# Patient Record
Sex: Female | Born: 1935 | Race: Black or African American | Hispanic: No | Smoking: Never smoker
Health system: Southern US, Community
[De-identification: ages and names within clinical notes are randomized; demographics above are authoritative.]

## PROBLEM LIST (undated history)

## (undated) DIAGNOSIS — I499 Cardiac arrhythmia, unspecified: Secondary | ICD-10-CM

## (undated) DIAGNOSIS — M069 Rheumatoid arthritis, unspecified: Secondary | ICD-10-CM

## (undated) DIAGNOSIS — F03918 Unspecified dementia, unspecified severity, with other behavioral disturbance: Secondary | ICD-10-CM

## (undated) DIAGNOSIS — R569 Unspecified convulsions: Secondary | ICD-10-CM

## (undated) DIAGNOSIS — R609 Edema, unspecified: Secondary | ICD-10-CM

## (undated) DIAGNOSIS — I639 Cerebral infarction, unspecified: Secondary | ICD-10-CM

## (undated) DIAGNOSIS — E559 Vitamin D deficiency, unspecified: Secondary | ICD-10-CM

## (undated) DIAGNOSIS — F411 Generalized anxiety disorder: Secondary | ICD-10-CM

## (undated) DIAGNOSIS — I10 Essential (primary) hypertension: Secondary | ICD-10-CM

## (undated) DIAGNOSIS — I2510 Atherosclerotic heart disease of native coronary artery without angina pectoris: Secondary | ICD-10-CM

## (undated) DIAGNOSIS — W19XXXA Unspecified fall, initial encounter: Secondary | ICD-10-CM

## (undated) DIAGNOSIS — E039 Hypothyroidism, unspecified: Secondary | ICD-10-CM

## (undated) DIAGNOSIS — I6990 Unspecified sequelae of unspecified cerebrovascular disease: Secondary | ICD-10-CM

## (undated) DIAGNOSIS — F29 Unspecified psychosis not due to a substance or known physiological condition: Secondary | ICD-10-CM

## (undated) DIAGNOSIS — G4700 Insomnia, unspecified: Secondary | ICD-10-CM

## (undated) DIAGNOSIS — F0391 Unspecified dementia with behavioral disturbance: Secondary | ICD-10-CM

## (undated) DIAGNOSIS — G9340 Encephalopathy, unspecified: Secondary | ICD-10-CM

## (undated) DIAGNOSIS — D649 Anemia, unspecified: Secondary | ICD-10-CM

## (undated) DIAGNOSIS — M810 Age-related osteoporosis without current pathological fracture: Secondary | ICD-10-CM

## (undated) DIAGNOSIS — K5900 Constipation, unspecified: Secondary | ICD-10-CM

## (undated) DIAGNOSIS — R32 Unspecified urinary incontinence: Secondary | ICD-10-CM

## (undated) DIAGNOSIS — E871 Hypo-osmolality and hyponatremia: Secondary | ICD-10-CM

## (undated) HISTORY — DX: Atherosclerotic heart disease of native coronary artery without angina pectoris: I25.10

## (undated) HISTORY — DX: Age-related osteoporosis without current pathological fracture: M81.0

## (undated) HISTORY — DX: Vitamin D deficiency, unspecified: E55.9

## (undated) HISTORY — DX: Unspecified sequelae of unspecified cerebrovascular disease: I69.90

## (undated) HISTORY — DX: Insomnia, unspecified: G47.00

## (undated) HISTORY — DX: Constipation, unspecified: K59.00

## (undated) HISTORY — DX: Hypothyroidism, unspecified: E03.9

## (undated) HISTORY — DX: Edema, unspecified: R60.9

## (undated) HISTORY — DX: Generalized anxiety disorder: F41.1

## (undated) HISTORY — DX: Unspecified dementia, unspecified severity, with other behavioral disturbance: F03.918

## (undated) HISTORY — DX: Unspecified dementia with behavioral disturbance: F03.91

## (undated) HISTORY — PX: TOE SURGERY: SHX1073

---

## 2000-10-27 ENCOUNTER — Emergency Department (HOSPITAL_COMMUNITY): Admission: EM | Admit: 2000-10-27 | Discharge: 2000-10-27 | Payer: Self-pay | Admitting: Emergency Medicine

## 2000-11-09 ENCOUNTER — Other Ambulatory Visit: Admission: RE | Admit: 2000-11-09 | Discharge: 2000-11-09 | Payer: Self-pay | Admitting: Internal Medicine

## 2000-11-15 ENCOUNTER — Encounter: Payer: Self-pay | Admitting: Internal Medicine

## 2000-11-15 ENCOUNTER — Ambulatory Visit (HOSPITAL_COMMUNITY): Admission: RE | Admit: 2000-11-15 | Discharge: 2000-11-15 | Payer: Self-pay | Admitting: Internal Medicine

## 2001-09-05 ENCOUNTER — Encounter: Payer: Self-pay | Admitting: Family Medicine

## 2001-09-05 ENCOUNTER — Ambulatory Visit (HOSPITAL_COMMUNITY): Admission: RE | Admit: 2001-09-05 | Discharge: 2001-09-05 | Payer: Self-pay | Admitting: Family Medicine

## 2001-10-11 ENCOUNTER — Encounter (HOSPITAL_COMMUNITY): Admission: RE | Admit: 2001-10-11 | Discharge: 2001-11-10 | Payer: Self-pay | Admitting: Rheumatology

## 2001-10-11 ENCOUNTER — Encounter: Payer: Self-pay | Admitting: Rheumatology

## 2001-10-17 ENCOUNTER — Encounter: Admission: RE | Admit: 2001-10-17 | Discharge: 2001-10-17 | Payer: Self-pay | Admitting: Rheumatology

## 2001-10-17 ENCOUNTER — Encounter: Payer: Self-pay | Admitting: Rheumatology

## 2001-11-15 ENCOUNTER — Encounter (HOSPITAL_COMMUNITY): Admission: RE | Admit: 2001-11-15 | Discharge: 2001-12-15 | Payer: Self-pay | Admitting: Rheumatology

## 2001-11-19 ENCOUNTER — Ambulatory Visit (HOSPITAL_COMMUNITY): Admission: RE | Admit: 2001-11-19 | Discharge: 2001-11-19 | Payer: Self-pay | Admitting: Family Medicine

## 2001-11-19 ENCOUNTER — Encounter: Payer: Self-pay | Admitting: Family Medicine

## 2002-02-10 ENCOUNTER — Emergency Department (HOSPITAL_COMMUNITY): Admission: EM | Admit: 2002-02-10 | Discharge: 2002-02-10 | Payer: Self-pay | Admitting: Emergency Medicine

## 2002-02-10 ENCOUNTER — Encounter: Payer: Self-pay | Admitting: Emergency Medicine

## 2002-02-12 ENCOUNTER — Ambulatory Visit (HOSPITAL_COMMUNITY): Admission: RE | Admit: 2002-02-12 | Discharge: 2002-02-12 | Payer: Self-pay | Admitting: Family Medicine

## 2002-02-28 ENCOUNTER — Encounter (HOSPITAL_COMMUNITY): Admission: RE | Admit: 2002-02-28 | Discharge: 2002-03-30 | Payer: Self-pay | Admitting: Rheumatology

## 2002-04-25 ENCOUNTER — Encounter (HOSPITAL_COMMUNITY): Admission: RE | Admit: 2002-04-25 | Discharge: 2002-05-25 | Payer: Self-pay | Admitting: Rheumatology

## 2002-06-06 ENCOUNTER — Encounter (HOSPITAL_COMMUNITY): Admission: RE | Admit: 2002-06-06 | Discharge: 2002-07-06 | Payer: Self-pay | Admitting: Rheumatology

## 2002-08-01 ENCOUNTER — Encounter (HOSPITAL_COMMUNITY): Admission: RE | Admit: 2002-08-01 | Discharge: 2002-08-31 | Payer: Self-pay | Admitting: Rheumatology

## 2002-08-01 ENCOUNTER — Encounter: Admission: RE | Admit: 2002-08-01 | Discharge: 2002-08-01 | Payer: Self-pay | Admitting: Rheumatology

## 2002-11-20 ENCOUNTER — Ambulatory Visit (HOSPITAL_COMMUNITY): Admission: RE | Admit: 2002-11-20 | Discharge: 2002-11-20 | Payer: Self-pay | Admitting: Family Medicine

## 2002-11-20 ENCOUNTER — Encounter: Payer: Self-pay | Admitting: Family Medicine

## 2002-11-21 ENCOUNTER — Encounter (HOSPITAL_COMMUNITY): Admission: RE | Admit: 2002-11-21 | Discharge: 2002-12-21 | Payer: Self-pay | Admitting: Rheumatology

## 2003-02-13 ENCOUNTER — Encounter (HOSPITAL_COMMUNITY): Admission: RE | Admit: 2003-02-13 | Discharge: 2003-03-15 | Payer: Self-pay | Admitting: Rheumatology

## 2003-04-23 ENCOUNTER — Encounter (HOSPITAL_COMMUNITY): Admission: RE | Admit: 2003-04-23 | Discharge: 2003-05-23 | Payer: Self-pay | Admitting: Rheumatology

## 2003-06-11 ENCOUNTER — Inpatient Hospital Stay (HOSPITAL_COMMUNITY): Admission: EM | Admit: 2003-06-11 | Discharge: 2003-06-12 | Payer: Self-pay | Admitting: Emergency Medicine

## 2003-06-16 ENCOUNTER — Ambulatory Visit (HOSPITAL_COMMUNITY): Admission: RE | Admit: 2003-06-16 | Discharge: 2003-06-16 | Payer: Self-pay | Admitting: Family Medicine

## 2003-07-27 ENCOUNTER — Emergency Department (HOSPITAL_COMMUNITY): Admission: EM | Admit: 2003-07-27 | Discharge: 2003-07-27 | Payer: Self-pay | Admitting: Emergency Medicine

## 2003-08-05 ENCOUNTER — Inpatient Hospital Stay (HOSPITAL_COMMUNITY): Admission: AD | Admit: 2003-08-05 | Discharge: 2003-08-06 | Payer: Self-pay | Admitting: *Deleted

## 2003-11-24 ENCOUNTER — Ambulatory Visit (HOSPITAL_COMMUNITY): Admission: RE | Admit: 2003-11-24 | Discharge: 2003-11-24 | Payer: Self-pay | Admitting: Family Medicine

## 2003-12-18 ENCOUNTER — Ambulatory Visit (HOSPITAL_COMMUNITY): Admission: RE | Admit: 2003-12-18 | Discharge: 2003-12-18 | Payer: Self-pay | Admitting: Internal Medicine

## 2004-06-15 ENCOUNTER — Ambulatory Visit: Payer: Self-pay | Admitting: Family Medicine

## 2004-09-16 ENCOUNTER — Ambulatory Visit: Payer: Self-pay | Admitting: Family Medicine

## 2004-11-24 ENCOUNTER — Ambulatory Visit: Payer: Self-pay | Admitting: Family Medicine

## 2004-11-26 ENCOUNTER — Ambulatory Visit (HOSPITAL_COMMUNITY): Admission: RE | Admit: 2004-11-26 | Discharge: 2004-11-26 | Payer: Self-pay | Admitting: Family Medicine

## 2005-03-11 ENCOUNTER — Ambulatory Visit: Payer: Self-pay | Admitting: Family Medicine

## 2005-04-05 ENCOUNTER — Ambulatory Visit: Payer: Self-pay | Admitting: Family Medicine

## 2005-04-11 ENCOUNTER — Ambulatory Visit (HOSPITAL_COMMUNITY): Admission: RE | Admit: 2005-04-11 | Discharge: 2005-04-11 | Payer: Self-pay | Admitting: Pediatrics

## 2005-04-18 ENCOUNTER — Ambulatory Visit (HOSPITAL_COMMUNITY): Admission: RE | Admit: 2005-04-18 | Discharge: 2005-04-18 | Payer: Self-pay | Admitting: *Deleted

## 2005-08-05 ENCOUNTER — Ambulatory Visit: Payer: Self-pay | Admitting: Family Medicine

## 2005-08-05 ENCOUNTER — Ambulatory Visit (HOSPITAL_COMMUNITY): Admission: RE | Admit: 2005-08-05 | Discharge: 2005-08-05 | Payer: Self-pay | Admitting: Family Medicine

## 2005-08-26 ENCOUNTER — Ambulatory Visit: Payer: Self-pay | Admitting: Family Medicine

## 2005-11-25 ENCOUNTER — Encounter (INDEPENDENT_AMBULATORY_CARE_PROVIDER_SITE_OTHER): Payer: Self-pay | Admitting: *Deleted

## 2005-11-25 LAB — CONVERTED CEMR LAB: Pap Smear: NORMAL

## 2005-11-28 ENCOUNTER — Ambulatory Visit: Payer: Self-pay | Admitting: Family Medicine

## 2005-11-28 ENCOUNTER — Ambulatory Visit (HOSPITAL_COMMUNITY): Admission: RE | Admit: 2005-11-28 | Discharge: 2005-11-28 | Payer: Self-pay | Admitting: Family Medicine

## 2005-11-28 ENCOUNTER — Other Ambulatory Visit: Admission: RE | Admit: 2005-11-28 | Discharge: 2005-11-28 | Payer: Self-pay | Admitting: Family Medicine

## 2006-02-24 ENCOUNTER — Emergency Department (HOSPITAL_COMMUNITY): Admission: EM | Admit: 2006-02-24 | Discharge: 2006-02-24 | Payer: Self-pay | Admitting: Emergency Medicine

## 2006-03-20 ENCOUNTER — Ambulatory Visit: Payer: Self-pay | Admitting: Family Medicine

## 2006-03-21 ENCOUNTER — Ambulatory Visit: Payer: Self-pay | Admitting: Family Medicine

## 2006-03-22 ENCOUNTER — Ambulatory Visit (HOSPITAL_COMMUNITY): Admission: RE | Admit: 2006-03-22 | Discharge: 2006-03-22 | Payer: Self-pay | Admitting: Family Medicine

## 2006-06-05 ENCOUNTER — Observation Stay (HOSPITAL_COMMUNITY): Admission: EM | Admit: 2006-06-05 | Discharge: 2006-06-06 | Payer: Self-pay | Admitting: Emergency Medicine

## 2006-06-27 HISTORY — PX: EYE SURGERY: SHX253

## 2006-06-30 ENCOUNTER — Ambulatory Visit: Payer: Self-pay | Admitting: Family Medicine

## 2006-07-28 ENCOUNTER — Ambulatory Visit: Payer: Self-pay | Admitting: Family Medicine

## 2006-08-25 ENCOUNTER — Ambulatory Visit: Payer: Self-pay | Admitting: Family Medicine

## 2006-09-08 ENCOUNTER — Encounter: Payer: Self-pay | Admitting: Family Medicine

## 2006-09-08 LAB — CONVERTED CEMR LAB
ALT: <8 units/L (ref 0–35)
AST: 13 units/L (ref 0–37)
Albumin: 3.9 g/dL (ref 3.5–5.2)
Alkaline Phosphatase: 54 units/L (ref 39–117)
BUN: 11 mg/dL (ref 6–23)
Bilirubin, Direct: 0.1 mg/dL (ref 0.0–0.3)
CO2: 27 meq/L (ref 19–32)
Calcium: 9.1 mg/dL (ref 8.4–10.5)
Chloride: 103 meq/L (ref 96–112)
Cholesterol: 129 mg/dL (ref 0–200)
Creatinine, Ser: 0.62 mg/dL (ref 0.40–1.20)
Glucose, Bld: 79 mg/dL (ref 70–99)
HCT: 34.2 % — ABNORMAL LOW (ref 36.0–46.0)
HDL: 58 mg/dL (ref >39)
Hemoglobin: 10.8 g/dL — ABNORMAL LOW (ref 12.0–15.0)
Indirect Bilirubin: 0.3 mg/dL (ref 0.0–0.9)
LDL Cholesterol: 60 mg/dL (ref 0–99)
MCHC: 31.6 g/dL (ref 30.0–36.0)
MCV: 92.2 fL (ref 78.0–100.0)
Platelets: 312 10*3/uL (ref 150–400)
Potassium: 5 meq/L (ref 3.5–5.3)
RBC: 3.71 M/uL — ABNORMAL LOW (ref 3.87–5.11)
RDW: 15.7 % — ABNORMAL HIGH (ref 11.5–14.0)
Sodium: 139 meq/L (ref 135–145)
Total Bilirubin: 0.4 mg/dL (ref 0.3–1.2)
Total CHOL/HDL Ratio: 2.2
Total Protein: 7 g/dL (ref 6.0–8.3)
Triglycerides: 57 mg/dL (ref <150)
VLDL: 11 mg/dL (ref 0–40)
WBC: 2.9 10*3/uL — ABNORMAL LOW (ref 4.0–10.5)

## 2006-11-24 ENCOUNTER — Ambulatory Visit: Payer: Self-pay | Admitting: Family Medicine

## 2006-12-01 ENCOUNTER — Ambulatory Visit (HOSPITAL_COMMUNITY): Admission: RE | Admit: 2006-12-01 | Discharge: 2006-12-01 | Payer: Self-pay | Admitting: Family Medicine

## 2006-12-25 ENCOUNTER — Ambulatory Visit: Payer: Self-pay | Admitting: Family Medicine

## 2006-12-25 ENCOUNTER — Other Ambulatory Visit: Admission: RE | Admit: 2006-12-25 | Discharge: 2006-12-25 | Payer: Self-pay | Admitting: Family Medicine

## 2006-12-25 ENCOUNTER — Encounter: Payer: Self-pay | Admitting: Family Medicine

## 2006-12-25 ENCOUNTER — Encounter (INDEPENDENT_AMBULATORY_CARE_PROVIDER_SITE_OTHER): Payer: Self-pay | Admitting: *Deleted

## 2006-12-25 LAB — CONVERTED CEMR LAB: Pap Smear: NORMAL

## 2007-02-06 ENCOUNTER — Ambulatory Visit (HOSPITAL_COMMUNITY): Admission: RE | Admit: 2007-02-06 | Discharge: 2007-02-06 | Payer: Self-pay | Admitting: Ophthalmology

## 2007-03-06 ENCOUNTER — Ambulatory Visit (HOSPITAL_COMMUNITY): Admission: RE | Admit: 2007-03-06 | Discharge: 2007-03-06 | Payer: Self-pay | Admitting: Ophthalmology

## 2007-04-06 ENCOUNTER — Ambulatory Visit: Payer: Self-pay | Admitting: Family Medicine

## 2007-04-26 ENCOUNTER — Ambulatory Visit: Payer: Self-pay | Admitting: Family Medicine

## 2007-04-30 ENCOUNTER — Encounter: Payer: Self-pay | Admitting: Family Medicine

## 2007-04-30 LAB — CONVERTED CEMR LAB
Cholesterol: 141 mg/dL (ref 0–200)
HDL: 59 mg/dL (ref >39)
LDL Cholesterol: 70 mg/dL (ref 0–99)
Total CHOL/HDL Ratio: 2.4
Triglycerides: 62 mg/dL (ref <150)
VLDL: 12 mg/dL (ref 0–40)

## 2007-05-25 ENCOUNTER — Emergency Department (HOSPITAL_COMMUNITY): Admission: EM | Admit: 2007-05-25 | Discharge: 2007-05-25 | Payer: Self-pay | Admitting: Emergency Medicine

## 2007-06-26 ENCOUNTER — Encounter: Payer: Self-pay | Admitting: *Deleted

## 2007-06-26 DIAGNOSIS — I10 Essential (primary) hypertension: Secondary | ICD-10-CM | POA: Insufficient documentation

## 2007-06-26 DIAGNOSIS — F411 Generalized anxiety disorder: Secondary | ICD-10-CM | POA: Insufficient documentation

## 2007-06-26 DIAGNOSIS — M1990 Unspecified osteoarthritis, unspecified site: Secondary | ICD-10-CM | POA: Insufficient documentation

## 2007-06-26 DIAGNOSIS — I2510 Atherosclerotic heart disease of native coronary artery without angina pectoris: Secondary | ICD-10-CM | POA: Insufficient documentation

## 2007-06-26 DIAGNOSIS — M069 Rheumatoid arthritis, unspecified: Secondary | ICD-10-CM | POA: Insufficient documentation

## 2007-06-26 DIAGNOSIS — M949 Disorder of cartilage, unspecified: Secondary | ICD-10-CM

## 2007-06-26 DIAGNOSIS — E785 Hyperlipidemia, unspecified: Secondary | ICD-10-CM

## 2007-06-26 DIAGNOSIS — M899 Disorder of bone, unspecified: Secondary | ICD-10-CM | POA: Insufficient documentation

## 2007-06-26 DIAGNOSIS — Z8719 Personal history of other diseases of the digestive system: Secondary | ICD-10-CM

## 2007-07-04 ENCOUNTER — Ambulatory Visit: Payer: Self-pay | Admitting: Family Medicine

## 2007-10-26 ENCOUNTER — Encounter: Payer: Self-pay | Admitting: Family Medicine

## 2007-10-26 LAB — CONVERTED CEMR LAB
ALT: <8 units/L (ref 0–35)
AST: 12 units/L (ref 0–37)
Albumin: 3.8 g/dL (ref 3.5–5.2)
Alkaline Phosphatase: 70 units/L (ref 39–117)
BUN: 6 mg/dL (ref 6–23)
Basophils Absolute: 0 10*3/uL (ref 0.0–0.1)
Basophils Relative: 1 % (ref 0–1)
Bilirubin, Direct: 0.1 mg/dL (ref 0.0–0.3)
CO2: 25 meq/L (ref 19–32)
Calcium: 9.2 mg/dL (ref 8.4–10.5)
Chloride: 105 meq/L (ref 96–112)
Cholesterol: 135 mg/dL (ref 0–200)
Creatinine, Ser: 0.66 mg/dL (ref 0.40–1.20)
Eosinophils Absolute: 0.2 10*3/uL (ref 0.0–0.7)
Eosinophils Relative: 5 % (ref 0–5)
Glucose, Bld: 82 mg/dL (ref 70–99)
HCT: 32.5 % — ABNORMAL LOW (ref 36.0–46.0)
HDL: 57 mg/dL (ref >39)
Hemoglobin: 10.3 g/dL — ABNORMAL LOW (ref 12.0–15.0)
Indirect Bilirubin: 0.4 mg/dL (ref 0.0–0.9)
LDL Cholesterol: 65 mg/dL (ref 0–99)
Lymphocytes Relative: 47 % — ABNORMAL HIGH (ref 12–46)
Lymphs Abs: 1.7 10*3/uL (ref 0.7–4.0)
MCHC: 31.7 g/dL (ref 30.0–36.0)
MCV: 84 fL (ref 78.0–100.0)
Monocytes Absolute: 0.5 10*3/uL (ref 0.1–1.0)
Monocytes Relative: 13 % — ABNORMAL HIGH (ref 3–12)
Neutro Abs: 1.3 10*3/uL — ABNORMAL LOW (ref 1.7–7.7)
Neutrophils Relative %: 35 % — ABNORMAL LOW (ref 43–77)
Platelets: 268 10*3/uL (ref 150–400)
Potassium: 4.2 meq/L (ref 3.5–5.3)
RBC: 3.87 M/uL (ref 3.87–5.11)
RDW: 17.8 % — ABNORMAL HIGH (ref 11.5–15.5)
Sodium: 140 meq/L (ref 135–145)
Total Bilirubin: 0.5 mg/dL (ref 0.3–1.2)
Total CHOL/HDL Ratio: 2.4
Total Protein: 6.9 g/dL (ref 6.0–8.3)
Triglycerides: 66 mg/dL (ref <150)
VLDL: 13 mg/dL (ref 0–40)
WBC: 3.7 10*3/uL — ABNORMAL LOW (ref 4.0–10.5)

## 2007-11-01 ENCOUNTER — Encounter: Payer: Self-pay | Admitting: Family Medicine

## 2007-11-01 ENCOUNTER — Ambulatory Visit: Payer: Self-pay | Admitting: Family Medicine

## 2007-12-04 ENCOUNTER — Ambulatory Visit (HOSPITAL_COMMUNITY): Admission: RE | Admit: 2007-12-04 | Discharge: 2007-12-04 | Payer: Self-pay | Admitting: Family Medicine

## 2008-03-12 ENCOUNTER — Ambulatory Visit (HOSPITAL_COMMUNITY): Admission: RE | Admit: 2008-03-12 | Discharge: 2008-03-12 | Payer: Self-pay | Admitting: Family Medicine

## 2008-03-12 ENCOUNTER — Ambulatory Visit: Payer: Self-pay | Admitting: Family Medicine

## 2008-03-12 DIAGNOSIS — M79609 Pain in unspecified limb: Secondary | ICD-10-CM | POA: Insufficient documentation

## 2008-03-24 ENCOUNTER — Telehealth: Payer: Self-pay | Admitting: Family Medicine

## 2008-03-27 ENCOUNTER — Encounter: Payer: Self-pay | Admitting: Family Medicine

## 2008-03-27 LAB — CONVERTED CEMR LAB
ALT: 12 units/L (ref 0–35)
AST: 18 units/L (ref 0–37)
Albumin: 3.9 g/dL (ref 3.5–5.2)
Alkaline Phosphatase: 69 units/L (ref 39–117)
BUN: 13 mg/dL (ref 6–23)
Basophils Absolute: 0 10*3/uL (ref 0.0–0.1)
Basophils Relative: 0 % (ref 0–1)
Bilirubin, Direct: 0.3 mg/dL (ref 0.0–0.3)
CO2: 24 meq/L (ref 19–32)
Calcium: 9.3 mg/dL (ref 8.4–10.5)
Chloride: 101 meq/L (ref 96–112)
Cholesterol: 138 mg/dL (ref 0–200)
Creatinine, Ser: 0.68 mg/dL (ref 0.40–1.20)
Eosinophils Absolute: 0.2 10*3/uL (ref 0.0–0.7)
Eosinophils Relative: 4 % (ref 0–5)
Glucose, Bld: 85 mg/dL (ref 70–99)
HCT: 32.2 % — ABNORMAL LOW (ref 36.0–46.0)
HDL: 65 mg/dL (ref >39)
Hemoglobin: 10.4 g/dL — ABNORMAL LOW (ref 12.0–15.0)
Indirect Bilirubin: 0.4 mg/dL (ref 0.0–0.9)
LDL Cholesterol: 64 mg/dL (ref 0–99)
Lymphocytes Relative: 36 % (ref 12–46)
Lymphs Abs: 1.8 10*3/uL (ref 0.7–4.0)
MCHC: 32.3 g/dL (ref 30.0–36.0)
MCV: 84.7 fL (ref 78.0–100.0)
Monocytes Absolute: 0.5 10*3/uL (ref 0.1–1.0)
Monocytes Relative: 9 % (ref 3–12)
Neutro Abs: 2.5 10*3/uL (ref 1.7–7.7)
Neutrophils Relative %: 50 % (ref 43–77)
Platelets: 289 10*3/uL (ref 150–400)
Potassium: 4.5 meq/L (ref 3.5–5.3)
RBC: 3.8 M/uL — ABNORMAL LOW (ref 3.87–5.11)
RDW: 15.7 % — ABNORMAL HIGH (ref 11.5–15.5)
Sodium: 139 meq/L (ref 135–145)
Total Bilirubin: 0.7 mg/dL (ref 0.3–1.2)
Total CHOL/HDL Ratio: 2.1
Total Protein: 7.6 g/dL (ref 6.0–8.3)
Triglycerides: 43 mg/dL (ref <150)
VLDL: 9 mg/dL (ref 0–40)
WBC: 5 10*3/uL (ref 4.0–10.5)

## 2008-04-25 ENCOUNTER — Encounter: Payer: Self-pay | Admitting: Family Medicine

## 2008-05-20 ENCOUNTER — Encounter: Payer: Self-pay | Admitting: Family Medicine

## 2008-07-08 ENCOUNTER — Encounter: Payer: Self-pay | Admitting: Family Medicine

## 2008-07-09 ENCOUNTER — Encounter: Payer: Self-pay | Admitting: Family Medicine

## 2008-07-28 ENCOUNTER — Encounter: Payer: Self-pay | Admitting: Family Medicine

## 2008-08-12 ENCOUNTER — Encounter: Payer: Self-pay | Admitting: Family Medicine

## 2008-08-13 ENCOUNTER — Encounter: Payer: Self-pay | Admitting: Family Medicine

## 2008-08-19 ENCOUNTER — Ambulatory Visit: Payer: Self-pay | Admitting: Family Medicine

## 2008-08-19 ENCOUNTER — Telehealth: Payer: Self-pay | Admitting: Family Medicine

## 2008-08-22 ENCOUNTER — Telehealth: Payer: Self-pay | Admitting: Family Medicine

## 2008-11-25 ENCOUNTER — Encounter: Payer: Self-pay | Admitting: Family Medicine

## 2008-12-05 ENCOUNTER — Ambulatory Visit (HOSPITAL_COMMUNITY): Admission: RE | Admit: 2008-12-05 | Discharge: 2008-12-05 | Payer: Self-pay | Admitting: Family Medicine

## 2008-12-08 ENCOUNTER — Encounter: Payer: Self-pay | Admitting: Orthopedic Surgery

## 2008-12-08 ENCOUNTER — Emergency Department (HOSPITAL_COMMUNITY): Admission: EM | Admit: 2008-12-08 | Discharge: 2008-12-08 | Payer: Self-pay | Admitting: Emergency Medicine

## 2009-02-10 ENCOUNTER — Emergency Department (HOSPITAL_COMMUNITY): Admission: EM | Admit: 2009-02-10 | Discharge: 2009-02-10 | Payer: Self-pay | Admitting: Emergency Medicine

## 2009-12-07 ENCOUNTER — Ambulatory Visit (HOSPITAL_COMMUNITY): Admission: RE | Admit: 2009-12-07 | Discharge: 2009-12-07 | Payer: Self-pay | Admitting: Family Medicine

## 2010-04-19 ENCOUNTER — Ambulatory Visit: Payer: Self-pay | Admitting: Orthopedic Surgery

## 2010-04-23 ENCOUNTER — Encounter: Payer: Self-pay | Admitting: Orthopedic Surgery

## 2010-04-30 ENCOUNTER — Encounter: Payer: Self-pay | Admitting: Family Medicine

## 2010-05-04 ENCOUNTER — Encounter: Payer: Self-pay | Admitting: Orthopedic Surgery

## 2010-05-31 ENCOUNTER — Encounter: Payer: Self-pay | Admitting: Family Medicine

## 2010-05-31 ENCOUNTER — Telehealth: Payer: Self-pay | Admitting: Orthopedic Surgery

## 2010-06-03 ENCOUNTER — Encounter: Payer: Self-pay | Admitting: Orthopedic Surgery

## 2010-06-07 ENCOUNTER — Telehealth: Payer: Self-pay | Admitting: Orthopedic Surgery

## 2010-07-27 ENCOUNTER — Emergency Department (HOSPITAL_COMMUNITY)
Admission: EM | Admit: 2010-07-27 | Discharge: 2010-07-27 | Payer: Self-pay | Source: Home / Self Care | Admitting: Emergency Medicine

## 2010-07-27 NOTE — Progress Notes (Signed)
Summary: Is it ok to drive?  Phone Note Call from Patient   Summary of Call: [name] asked if it is ok for her to drive, says she thinks you are the doctor that told her not to drive, but is not sure.  Said she has seen Dr. Kellie Simmering and he did not see a problem with her driving. Her # [phone] Initial call taken by: Jacklynn Ganong,  May 31, 2010 11:50 AM  Follow-up for Phone Call        i did not say about driving  Follow-up by: Fuller Canada MD,  May 31, 2010 2:54 PM  Additional Follow-up for Phone Call Additional follow up Details #1::        Left a message to call office Additional Follow-up by: Jacklynn Ganong,  May 31, 2010 3:20 PM    Additional Follow-up for Phone Call Additional follow up Details #2::    Patient called back, given doctor's reply Follow-up by: Jacklynn Ganong,  May 31, 2010 3:41 PM

## 2010-07-27 NOTE — Letter (Signed)
Summary: DR. Kellie Simmering  DR. TRUSLOW   Imported By: Lind Guest 05/06/2010 11:03:22  _____________________________________________________________________  External Attachment:    Type:   Image     Comment:   External Document

## 2010-07-27 NOTE — Letter (Signed)
Summary: rhematology  rhematology   Imported By: Lind Guest 06/04/2010 13:38:35  _____________________________________________________________________  External Attachment:    Type:   Image     Comment:   External Document

## 2010-07-27 NOTE — Letter (Signed)
Summary: History form  History form   Imported By: Jacklynn Ganong 04/23/2010 09:59:00  _____________________________________________________________________  External Attachment:    Type:   Image     Comment:   External Document

## 2010-07-27 NOTE — Letter (Signed)
Summary: Previous notes and left hip XR report  Previous notes and left hip XR report   Imported By: Jacklynn Ganong 04/23/2010 10:01:13  _____________________________________________________________________  External Attachment:    Type:   Image     Comment:   External Document

## 2010-07-27 NOTE — Assessment & Plan Note (Signed)
Summary: EVAL/TREAT BILAT LEG PAIN/NEEDS XRAYS/REF [facility]/MEDICARE,BCB...   Visit Type:  new patient Referring Provider:  Dr. Mirna Mires Primary Provider:  Dr. Alyse Low  CC:  leg pain.  History of Present Illness: I saw [name] in the office today for an initial visit.  She is a 77 years old woman with the complaint of:  leg weakness and aching.  This is a 77 year old female with rheumatoid arthritis who has recently discharged from her rheumatologist in good condition who presents with bilateral leg weakness and aching in her calves and lower legs.  She denies back pain or hip pain but does complain of some intermittent numbness in her LEFT leg and foot.  She reports frequent falling with the legs feeling as if they're going to come out from under her.  Her legs seem to be getting weaker and she's had to use a cane the last 3 months  An x-ray was done of her LEFT hip which was essentially without any acute disease without any major arthritic changes.  Meds: Metoprolol, Fish oil, Colon clenz, Bayer ASA, Simvastatin, Xanax.           Allergies (verified): 1)  ! * Zinc 2)  ! Codeine 3)  ! * Famotidine 4)  ! * Propoyphex 5)  ! * Colchicine 6)  ! Steroids  Past History:  Past Medical History: Last updated: 06/26/2007 Rheumatoid arthritis Coronary artery disease Hyperlipidemia Osteoarthritis Hypertension Anxiety Osteopenia hx of Constipation   Past Surgical History: Last updated: 06/26/2007 Cataract extraction-01/2007 & 02/2007 corrective toe surgery-06/1979 Cardiac stent placement-07/2003  Family History: mother- 76 deceased, kidney failure father-50's deceased. MI Family History of Rhematoid Arthritis Family History Diabetes 1st degree relative Family History High cholesterol Abdominal aortic aneurysm Family History Coronary Heart Disease female < 61 Family History of Arthritis Hx, family, kidney disease NEC  Social  History: widowed Retired Never Smoked Alcohol use-no Drug use-no 2 children 2 yrs college  Review of Systems Constitutional:  Complains of chills; denies weight loss, weight gain, fever, and fatigue. Cardiovascular:  Denies chest pain, palpitations, fainting, and murmurs. Respiratory:  Denies short of breath, wheezing, couch, tightness, pain on inspiration, and snoring . Gastrointestinal:  Complains of constipation; denies heartburn, nausea, vomiting, diarrhea, and blood in your stools. Genitourinary:  Complains of frequency and urgency; denies difficulty urinating, painful urination, flank pain, and bleeding in urine. Neurologic:  Complains of numbness and tingling; denies unsteady gait, dizziness, tremors, and seizure. Musculoskeletal:  Complains of joint pain, stiffness, heat, and muscle pain; denies swelling, instability, and redness. Endocrine:  Complains of exessive urination and heat or cold intolerance; denies excessive thirst. Psychiatric:  Complains of anxiety; denies nervousness, depression, and hallucinations. Skin:  Denies changes in the skin, poor healing, rash, itching, and redness. HEENT:  Denies blurred or double vision, eye pain, redness, and watering. Immunology:  Denies seasonal allergies, sinus problems, and allergic to bee stings. Hemoatologic:  Complains of brusing; denies easy bleeding.  Physical Exam  Additional Exam:  general appearance the patient has the body habitus and deformities consistent with a rheumatoid patient  She is oriented x3 she is very pleasant in good spirits  She ambulates with a cane with a slow gait and a decreased stride length and mild deformities of the knee joints  The patient had no tenderness in her back or neck.  She had multiple deformities of her hands consistent with rheumatoid arthritis.  She had negative straight leg raises grade 5 strength in the hip flexors, abductors,  adductor is, flexion of the knee extension of the  knee, dorsiflexion of the foot in plantarflexion of the foot.  The hips and knees were stable.  She did have crepitance in both knees and some pain in both knees.  She was palpably tender in her calves and lower legs.  She has normal perfusion and temperature to all 4 extremities and no lymphadenopathy.  Reflexes were normal straight leg raises were negative.   Impression & Recommendations:  Problem # 1:  RHEUMATOID ARTHRITIS (ICD-714.0) Assessment New  data reviewed include Notes from Dr. Norlene Duel as well they did Chaney rate 12 and 13 which indicate that the patient was advised to take a medication which she admits to but she didn't take it because it caused her side effects and she was basically discharge at that point.  The LEFT hip x-ray report is reviewed as stated no acute disease  Assessment: I cannot find a cause for this patient's weakness.  Several things may be contributory.  The pain in her muscles may be related to simvastatin I've advised her to stop it for 6 weeks and see if that goes away.  She could of course have some type of pannus formation in the cervical region that might be causing lower extremity weakness.  I've advised her to seek her rheumatologist again and we've actually called to set up an appointment for her to see if she may need to go back on the medication.  She is advised to see Dr. Loleta Chance regarding the simvastatin and possibly switching the medicine or stopping it altogether.  Orders: New Patient Level III (04540)  Patient Instructions: 1)  REC F/U WITH DR TRUSLOW  2)  STOP SIMVISTATIN  3)  Please schedule a follow-up appointment as needed. 4)  Appt with Dr. Kellie Simmering is for november 4th at 945am   Orders Added: 1)  New Patient Level III [99203]

## 2010-07-27 NOTE — Consult Note (Signed)
Summary: Consultation Note from Dr. Stacey Drain  Consultation Note from Dr. Stacey Drain   Imported By: Jacklynn Ganong 05/04/2010 13:52:08  _____________________________________________________________________  External Attachment:    Type:   Image     Comment:   External Document

## 2010-07-29 NOTE — Progress Notes (Signed)
Summary: wante you to call her  Phone Note Call from Patient   Summary of Call: [name] (11-02-2029) asked for you to call her, she has a question about medication.  She would not tell me the question,insist on  telling you.  Her # [phone] Initial call taken by: Jacklynn Ganong,  June 07, 2010 12:21 PM

## 2010-07-29 NOTE — Consult Note (Signed)
Summary: Consultation Report from Dr. Aundra Dubin  Consultation Report from Dr. Rutherford Guys  Truslow   Imported By: Jacklynn Ganong 06/03/2010 16:57:05  _____________________________________________________________________  External Attachment:    Type:   Image     Comment:   External Document

## 2010-08-23 ENCOUNTER — Ambulatory Visit (HOSPITAL_COMMUNITY)
Admission: RE | Admit: 2010-08-23 | Discharge: 2010-08-23 | Disposition: A | Discharge status: Home / Self Care | Payer: Medicare Other | Source: Ambulatory Visit | Attending: Family Medicine | Admitting: Family Medicine

## 2010-08-23 DIAGNOSIS — M545 Low back pain, unspecified: Secondary | ICD-10-CM | POA: Insufficient documentation

## 2010-08-23 DIAGNOSIS — M542 Cervicalgia: Secondary | ICD-10-CM | POA: Insufficient documentation

## 2010-08-23 DIAGNOSIS — IMO0001 Reserved for inherently not codable concepts without codable children: Secondary | ICD-10-CM | POA: Insufficient documentation

## 2010-08-23 DIAGNOSIS — M6281 Muscle weakness (generalized): Secondary | ICD-10-CM | POA: Insufficient documentation

## 2010-08-23 DIAGNOSIS — R262 Difficulty in walking, not elsewhere classified: Secondary | ICD-10-CM | POA: Insufficient documentation

## 2010-08-26 ENCOUNTER — Ambulatory Visit (HOSPITAL_COMMUNITY)
Admission: RE | Admit: 2010-08-26 | Discharge: 2010-08-26 | Disposition: A | Discharge status: Home / Self Care | Payer: Medicare Other | Source: Ambulatory Visit | Attending: Family Medicine | Admitting: Family Medicine

## 2010-08-26 DIAGNOSIS — R262 Difficulty in walking, not elsewhere classified: Secondary | ICD-10-CM | POA: Insufficient documentation

## 2010-08-26 DIAGNOSIS — M6281 Muscle weakness (generalized): Secondary | ICD-10-CM | POA: Insufficient documentation

## 2010-08-26 DIAGNOSIS — M542 Cervicalgia: Secondary | ICD-10-CM | POA: Insufficient documentation

## 2010-08-26 DIAGNOSIS — M545 Low back pain, unspecified: Secondary | ICD-10-CM | POA: Insufficient documentation

## 2010-08-26 DIAGNOSIS — IMO0001 Reserved for inherently not codable concepts without codable children: Secondary | ICD-10-CM | POA: Insufficient documentation

## 2010-09-02 ENCOUNTER — Ambulatory Visit (HOSPITAL_COMMUNITY)
Admission: RE | Admit: 2010-09-02 | Discharge: 2010-09-02 | Disposition: A | Discharge status: Home / Self Care | Payer: Medicare Other | Source: Ambulatory Visit | Attending: Family Medicine | Admitting: Family Medicine

## 2010-09-06 ENCOUNTER — Ambulatory Visit (HOSPITAL_COMMUNITY)
Admission: RE | Admit: 2010-09-06 | Discharge: 2010-09-06 | Disposition: A | Discharge status: Home / Self Care | Payer: Medicare Other | Source: Ambulatory Visit | Attending: Family Medicine | Admitting: Family Medicine

## 2010-09-09 ENCOUNTER — Ambulatory Visit (HOSPITAL_COMMUNITY)
Admission: RE | Admit: 2010-09-09 | Discharge: 2010-09-09 | Disposition: A | Discharge status: Home / Self Care | Payer: Medicare Other | Source: Ambulatory Visit | Attending: *Deleted | Admitting: *Deleted

## 2010-09-13 ENCOUNTER — Emergency Department (HOSPITAL_COMMUNITY): Payer: Medicare Other

## 2010-09-13 ENCOUNTER — Ambulatory Visit (HOSPITAL_COMMUNITY)
Admission: RE | Admit: 2010-09-13 | Discharge: 2010-09-13 | Disposition: A | Discharge status: Home / Self Care | Payer: Medicare Other | Source: Ambulatory Visit | Attending: *Deleted | Admitting: *Deleted

## 2010-09-13 ENCOUNTER — Emergency Department (HOSPITAL_COMMUNITY)
Admission: EM | Admit: 2010-09-13 | Discharge: 2010-09-13 | Disposition: A | Discharge status: Home / Self Care | Payer: Medicare Other | Attending: Emergency Medicine | Admitting: Emergency Medicine

## 2010-09-13 DIAGNOSIS — M069 Rheumatoid arthritis, unspecified: Secondary | ICD-10-CM | POA: Insufficient documentation

## 2010-09-13 DIAGNOSIS — E7800 Pure hypercholesterolemia, unspecified: Secondary | ICD-10-CM | POA: Insufficient documentation

## 2010-09-13 DIAGNOSIS — R079 Chest pain, unspecified: Secondary | ICD-10-CM | POA: Insufficient documentation

## 2010-09-13 DIAGNOSIS — I2510 Atherosclerotic heart disease of native coronary artery without angina pectoris: Secondary | ICD-10-CM | POA: Insufficient documentation

## 2010-09-13 DIAGNOSIS — I10 Essential (primary) hypertension: Secondary | ICD-10-CM | POA: Insufficient documentation

## 2010-09-13 LAB — COMPREHENSIVE METABOLIC PANEL WITH GFR
ALT: 9 U/L (ref 0–35)
AST: 16 U/L (ref 0–37)
Albumin: 3.3 g/dL — ABNORMAL LOW (ref 3.5–5.2)
Alkaline Phosphatase: 44 U/L (ref 39–117)
BUN: 5 mg/dL — ABNORMAL LOW (ref 6–23)
CO2: 27 meq/L (ref 19–32)
Calcium: 9.4 mg/dL (ref 8.4–10.5)
Chloride: 96 meq/L (ref 96–112)
Creatinine, Ser: 0.57 mg/dL (ref 0.4–1.2)
GFR calc Af Amer: >60 mL/min (ref >60)
GFR calc non Af Amer: >60 mL/min (ref >60)
Glucose, Bld: 84 mg/dL (ref 70–99)
Potassium: 3.8 meq/L (ref 3.5–5.1)
Sodium: 133 meq/L — ABNORMAL LOW (ref 135–145)
Total Bilirubin: 0.6 mg/dL (ref 0.3–1.2)
Total Protein: 7 g/dL (ref 6.0–8.3)

## 2010-09-13 LAB — POCT CARDIAC MARKERS
CKMB, poc: <1 ng/mL — ABNORMAL LOW (ref 1.0–8.0)
Myoglobin, poc: 50.7 ng/mL (ref 12–200)
Troponin i, poc: <0.05 ng/mL (ref 0.00–0.09)

## 2010-09-13 LAB — DIFFERENTIAL
Basophils Absolute: 0 10*3/uL (ref 0.0–0.1)
Basophils Relative: 1 % (ref 0–1)
Eosinophils Absolute: 0.1 10*3/uL (ref 0.0–0.7)
Monocytes Absolute: 0.4 10*3/uL (ref 0.1–1.0)
Monocytes Relative: 10 % (ref 3–12)
Neutrophils Relative %: 61 % (ref 43–77)

## 2010-09-13 LAB — CBC
HCT: 31.6 % — ABNORMAL LOW (ref 36.0–46.0)
Hemoglobin: 10.5 g/dL — ABNORMAL LOW (ref 12.0–15.0)
MCH: 29.1 pg (ref 26.0–34.0)
MCHC: 33.2 g/dL (ref 30.0–36.0)
MCV: 87.5 fL (ref 78.0–100.0)
Platelets: 315 K/uL (ref 150–400)
RBC: 3.61 MIL/uL — ABNORMAL LOW (ref 3.87–5.11)
RDW: 13.9 % (ref 11.5–15.5)
WBC: 3.7 K/uL — ABNORMAL LOW (ref 4.0–10.5)

## 2010-09-16 ENCOUNTER — Ambulatory Visit (HOSPITAL_COMMUNITY): Payer: BC Managed Care – PPO

## 2010-09-20 ENCOUNTER — Ambulatory Visit (HOSPITAL_COMMUNITY)
Admission: RE | Admit: 2010-09-20 | Discharge: 2010-09-20 | Disposition: A | Discharge status: Home / Self Care | Payer: Medicare Other | Source: Ambulatory Visit | Attending: *Deleted | Admitting: *Deleted

## 2010-09-23 ENCOUNTER — Ambulatory Visit (HOSPITAL_COMMUNITY)
Admission: RE | Admit: 2010-09-23 | Discharge: 2010-09-23 | Disposition: A | Discharge status: Home / Self Care | Payer: Medicare Other | Source: Ambulatory Visit | Attending: *Deleted | Admitting: *Deleted

## 2010-09-27 ENCOUNTER — Ambulatory Visit (HOSPITAL_COMMUNITY)
Admission: RE | Admit: 2010-09-27 | Discharge: 2010-09-27 | Disposition: A | Discharge status: Home / Self Care | Payer: Medicare Other | Source: Ambulatory Visit | Attending: Family Medicine | Admitting: Family Medicine

## 2010-09-27 DIAGNOSIS — M545 Low back pain, unspecified: Secondary | ICD-10-CM | POA: Insufficient documentation

## 2010-09-27 DIAGNOSIS — M6281 Muscle weakness (generalized): Secondary | ICD-10-CM | POA: Insufficient documentation

## 2010-09-27 DIAGNOSIS — M542 Cervicalgia: Secondary | ICD-10-CM | POA: Insufficient documentation

## 2010-09-27 DIAGNOSIS — R262 Difficulty in walking, not elsewhere classified: Secondary | ICD-10-CM | POA: Insufficient documentation

## 2010-09-27 DIAGNOSIS — IMO0001 Reserved for inherently not codable concepts without codable children: Secondary | ICD-10-CM | POA: Insufficient documentation

## 2010-09-30 ENCOUNTER — Ambulatory Visit (HOSPITAL_COMMUNITY)
Admission: RE | Admit: 2010-09-30 | Discharge: 2010-09-30 | Disposition: A | Discharge status: Home / Self Care | Payer: Medicare Other | Source: Ambulatory Visit | Admitting: Physical Therapy

## 2010-10-05 ENCOUNTER — Ambulatory Visit (HOSPITAL_COMMUNITY)
Admission: RE | Admit: 2010-10-05 | Discharge: 2010-10-05 | Disposition: A | Discharge status: Home / Self Care | Payer: Medicare Other | Source: Ambulatory Visit | Attending: *Deleted | Admitting: *Deleted

## 2010-10-07 ENCOUNTER — Ambulatory Visit (HOSPITAL_COMMUNITY)
Admission: RE | Admit: 2010-10-07 | Discharge: 2010-10-07 | Disposition: A | Discharge status: Home / Self Care | Payer: Medicare Other | Source: Ambulatory Visit | Attending: Family Medicine | Admitting: Family Medicine

## 2010-10-12 ENCOUNTER — Ambulatory Visit (HOSPITAL_COMMUNITY)
Admission: RE | Admit: 2010-10-12 | Discharge: 2010-10-12 | Disposition: A | Discharge status: Home / Self Care | Payer: Medicare Other | Source: Ambulatory Visit | Attending: Family Medicine | Admitting: Family Medicine

## 2010-10-14 ENCOUNTER — Ambulatory Visit (HOSPITAL_COMMUNITY)
Admission: RE | Admit: 2010-10-14 | Discharge: 2010-10-14 | Disposition: A | Discharge status: Home / Self Care | Payer: Medicare Other | Source: Ambulatory Visit | Attending: Family Medicine | Admitting: Family Medicine

## 2010-10-19 ENCOUNTER — Ambulatory Visit (HOSPITAL_COMMUNITY)
Admission: RE | Admit: 2010-10-19 | Discharge: 2010-10-19 | Disposition: A | Discharge status: Home / Self Care | Payer: Medicare Other | Source: Ambulatory Visit | Attending: Family Medicine | Admitting: Family Medicine

## 2010-10-21 ENCOUNTER — Ambulatory Visit (HOSPITAL_COMMUNITY)
Admission: RE | Admit: 2010-10-21 | Discharge: 2010-10-21 | Disposition: A | Discharge status: Home / Self Care | Payer: Medicare Other | Source: Ambulatory Visit | Attending: Family Medicine | Admitting: Family Medicine

## 2010-11-12 NOTE — Consult Note (Signed)
[facility]  Patient:    [name], [name] Visit Number: [id number] MRN: [id number]          Service Type: RHE Location: [facility] Attending Physician:  Aundra Dubin Dictated by:   Deanna Artis. Kellie Simmering, M.D. Proc. Date: 10/11/01 Admit Date:  10/11/2001   CC:         Mirna Mires, M.D., [place], [place]   Consultation Report  CHIEF COMPLAINT:  Right knee.  Dear Earvin Hansen:  Thank you for this consultation.  [name] is a 77 year old black female who reports having a diagnosis of rheumatoid arthritis for about 31 years.  I am not sure if she has been treated by a rheumatologist in the past.  She has been treated by orthopedists.  Her worst problem is her right knee with throbbing on into the calf.  She tells me the cartilage is gone and this hurts a great deal when she walks.  She is stiff not only in the right knee but many joints for about an hour.  She says the right knee has hurt since she fell in May 2002.  Also at that time she began having left leg numbness.  She hurts some in the back, there is occasionally mild back pain and tingling into the left thigh.  She knows that if she puts hot water on the right foot this can be severely warm and she barely feels it on the left.  Another problem is she has hand arthritis but she says they do no bother her. She has great difficulty lifting the shoulders because they hurt and are limited in range of motion.  REVIEW OF SYSTEMS:  On further review of systems, she denies fever, rashes, or significant weight loss.  She tires easily, particularly with any walking. She denies headaches, vision changes, jaw claudication, psoriasis, photosensitivity, oral ulcers, alopecia, pleurisy, Raynauds, diarrhea or blood in the bowel movement.  She has some constipation and wonders if this is related to the calcium.  She takes milk of magnesia to help with this.  PAST MEDICAL/SURGICAL HISTORY:  Bilateral toe operations  about 4 years ago. History of RA.  MEDICINES:  Darvocet one q.d. and Osteo-BiFlex b.i.d., ASA 81 mg q.d., Arthrotec 50 mg q.d.  DRUG INTOLERANCE:  SULFA.  FAMILY HISTORY:  Her father has died from heart disease.  Her mother died with kidney failure.  SOCIAL HISTORY:  She has lived much of her life in [place].  She had three children with her first husband.  She has remarried.  She retired in 1997.  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  She never smoked and does not drink alcohol.  PHYSICAL EXAMINATION:  VITAL SIGNS:  Weight 173 pounds, height 5 feet 3 inches. Blood pressure 108/66, respirations 14.  GENERAL:  She is overall healthy-appearing woman.  SKIN:  Clear.  HEENT:  Moderately cloudy lenses, PERL/EOMI.  Mouth with dentures; no obvious ulcers or petechiae.  NECK:  Negative JVD.  LUNGS:  Clear.  HEART:  Regular, no murmur.  ABDOMEN:  Negative HSM; nontender.  MUSCULOSKELETAL:  The hands show a pattern very consistent with rheumatoid arthritis.  She has significant synovitis to MCPs with mild ulnar deviation. The PIPs have slight tenderness.  Both hands are warm and the MCPs are moderately tender.  The wrists also have chronic synovitis, warmth and tenderness.  Elbows have 15-20 degree flexion contractures and no nodules. The shoulders move very poorly and quite stiffly.  She is only able to abduct each side to about 70 degrees.  Neck fair range of motion with mild tenderness.  Back nontender.  Hips good range of motion.  The right knee is quite warm and has crepitation and is quite tender with any flexion beyond about 110 degrees.  There was significant medial joint line tenderness.  The left knee was cool, flexed quite easily but is hypertrophic.  The ankles have mild tenderness.  She has signs of prior surgery to the big toes and she was moderately tender with compression across the MTPs.  NEUROLOGICAL:  She had  poor pinprick discrimination from the calf down and only on the left side.  The right side was completely normal.  DTRs were hyper at 3+ in the knees and 2+ at the biceps, shoulders seemed to have full strength, the thighs had good strength and testing the big toes strength was intact.  ASSESSMENT AND PLAN: 1. Rheumatoid arthritis.  I suspect this is rheumatoid arthritis by the exam.    I will have both her hands and shoulders x-rayed.  It is likely she needs    to be on remitting medicine such as methotrexate.  I will first place her    on prednisone starting at 30 mg for 4 days, then 20, then 15 mg each day    for 1-week intervals and she will lower to 10 mg a day.  She is cautioned    that prednisone can increase her appetite and therefore be associated with    weight gain.  She is advised to snack on whole fruits to afford weight    gain.  She will continue taking her calcium at 600 mg b.i.d.  She is    advised to not take Arthrotec except rarely at this time. 2. Right knee.  I reviewed x-rays from 2001.  She has significant osteophytes    to the distal femur.  There is also decreased patellofemoral joint space.    This appears to be more degenerative.  She also has the medial joint line    tenderness.  I will do no further workup of this knee.  I offered to inject    it but she says she has had one fairly recently and the effect lasted only    a few days. 3. Left leg numbness.  She has poor pinprick discrimination into this leg.  I    suspect she may have a problem with the lumbar spine and we will have her    undergo an open MRI.  She described being quite claustrophobic even going    through a car wash.  This will rule out an HNP or stenosis.  Earvin Hansen, I believe [name] has three active problems at this time.  She has most likely RA.  We will check an RF, ESR, CBC, and CMET.  She has the left leg numbness which may be involved with her spine.  And then she has the severe right  knee pain.  She ambulates with a cane and is limping.  I believe  she could be helped with an injection at least temporarily.  We will see how this might improve with the prednisone.  I will see her back in about 4 weeks. Thank you.  Sincerely, Dictated by:   Deanna Artis. Kellie Simmering, M.D. Attending Physician:  Aundra Dubin DD:  10/11/01 TD:  10/12/01 Job: [id number]

## 2010-11-12 NOTE — Discharge Summary (Signed)
[name], [name]             ACCOUNT NO.:  [id number]   MEDICAL RECORD NO.:  [id number]          PATIENT TYPE:  OBV   LOCATION:  A215                          FACILITY:  [facility]   PHYSICIAN:  Osvaldo Shipper, MD     DATE OF BIRTH:  09-02-1929   DATE OF ADMISSION:  06/05/2006  DATE OF DISCHARGE:  12/11/2007LH                               DISCHARGE SUMMARY   PRIMARY CARE PHYSICIAN:  Milus Mallick. Lodema Hong, M.D.   CARDIOLOGIST:  Dani Gobble, M.D. from [facility].   Please see the H&P dictated by Dr. Sherle Poe for details regarding the  patient's presenting illness.   DISCHARGE DIAGNOSES:  1. Chest pain, possible angina, possibly noncardiac, resolved.  2. History of coronary artery disease, stable.  3. History of hypertension, stable.  4. History of anxiety disorder, stable.  5. History of rheumatoid arthritis.   BRIEF HOSPITAL COURSE:  Briefly, this is a 77 year old African American  female who presented to the hospital with complaints of chest pain  yesterday.  The pain resolved with nitroglycerin in the ED; hence, the  patient was admitted to rule out acute coronary syndrome.  The patient  ruled out for acute coronary syndrome.  She had some nonspecific chest  pain this morning which also was momentary.  This prompted a check of a  D-dimer which came back more than 2.  Hence, this prompted a CT scan  which did not show any PE.  The patient's serial cardiac enzymes were  also unremarkable.  She was also seen by Dr. Alanda Amass from [facility], who mentioned that the patient was clear from his standpoint  for discharge as she had ruled out.  He recommended that the patient  call Dr. Domingo Sep for an appointment to follow up with her as an  outpatient.   Her other medical issues all remained stable.   She did have a hemoglobin of 10.1 today but normal MCV.  No bleeding was  mentioned by the patient.  I would recommend that her PMD follow this up  as an  outpatient.   On the day of discharge, the patient was feeling quite well.  She  mentioned that she was having some stress at home which she mentioned  could have precipitated her symptoms.  But otherwise she was feeling  quite well, vital signs were stable, and considering all of the above,  she was considered stable for discharge.   DISCHARGE MEDICATIONS:  I have not made any changes to any of her home  meds, please see H&P for a complete list.  No new medications were  prescribed.   FOLLOWUP:  The patient to call Dr. Roque Lias office in the morning for  an appointment.  With PMD as needed.   CONSULTATIONS:  Dr. Alanda Amass.   IMAGING STUDIES:  Chest x-ray which showed cardiomegaly with no active  disease.  And, a CT was done which did not show any PE.   DIET:  Heart healthy diet.   PHYSICAL ACTIVITY:  As before.   Total time spent at discharge about 25 minutes.      Osvaldo Shipper,  MD  Electronically Signed     GK/MEDQ  D:  06/06/2006  T:  06/06/2006  Job:  [id number]   cc:   Milus Mallick. Lodema Hong, M.D.  Fax: [phone]   Dani Gobble, MD  Fax: [phone]

## 2010-11-12 NOTE — Cardiovascular Report (Signed)
[name], [name]             ACCOUNT NO.:  [id number]   MEDICAL RECORD NO.:  [id number]          PATIENT TYPE:  OIB   LOCATION:  2899                         FACILITY:  [facility]   PHYSICIAN:  Darlin Priestly, MD  DATE OF BIRTH:  Apr 18, 1930   DATE OF PROCEDURE:  04/18/2005  DATE OF DISCHARGE:                              CARDIAC CATHETERIZATION   PROCEDURES:  1.  Left heart catheterization.  2.  Coronary angiography.  3.  Left ventriculogram.  4.  Right subclavian angiogram.   COMPLICATIONS:  None.   INDICATIONS:  [name] is a 77 year old female, patient of Dr. Dani Gobble and Dr. Syliva Overman, with a history of hypertension,  hyperlipidemia, history of CAD, status post CYPHER stenting of her AV groove  circumflex using a 3.0 x 18 stent in February 2005. This was following  abnormal Cardiolite suggesting anterolateral wall ischemia. She recently has  undergone repeat Cardiolite scan suggesting mild inferolateral wall  ischemia. She is now brought for cardiac catheterization to re-assess the  circumflex stent.   DESCRIPTION OF OPERATION:  After giving informed consent, the patient was  brought to the cardiac cath lab where the right and left groin were shaved,  prepped and draped in the sterile fashion. ECG monitoring was established.  Using a modified Seldinger technique, a 6-French arterial sheath was used to  enter the right femoral artery. A 6-French diagnostic catheter was used to  perform diagnostic angiography.   The left main is a large vessel with no significant disease.   The LAD is a large vessel, coursing the apex. There are two diagonal  branches. The LAD has mild 20% to 30% proximal stenosis. The remainder of  the LAD is irregular, but has no high-grade stenosis.   The first and second diagonals were medium size vessels with no significant  disease.   The left circumflex is a medium size vessel, coursing the AV groove, and  __________  one large  bifurcating obtuse marginal branch. The AV groove off  the circumflex is noted to have a stent which extended to the proximal  portion of the first OM. The stent appears to be widely patent. This was a  small branch coming off the OM which was geled at the time of stent  placement with 70% ostial lesion. This is a small vessel. Distal to the  stent in the OM there does not appear to be any significant disease.   The right coronary artery is a medium size vessel which is dominant,  __________ posterolateral branch. There is no significant disease in the  RCA, PDA, or posterolateral branch.   The left ventriculogram reveals a preserved EF of 60%.   Bilateral selective renal angiogram reveals no evidence of renal artery  stenosis. There is an upper and lower left renal artery.   Right subclavian angiography reveals a widely patent proximal right  subclavian artery. The right common carotid appears to be widely patent.   HEMODYNAMICS:  Systemic arterial pressure 145/65, LV systolic pressure  149/2, LVEDP of 12.   CONCLUSION:  1.  No significant coronary artery disease with  widely patent obtuse      marginal stent. There is a small branch originating from the first      obtuse marginal with 70% ostial disease which is chronic.  2.  Normal left ventricular systolic function.  3.  Widely patent bilateral renal arteries.  4.  Widely patent right proximal subclavian with no evidence of significant      disease in the right common carotid.  5.  Systemic hypertension.      Darlin Priestly, MD  Electronically Signed     RHM/MEDQ  D:  04/18/2005  T:  04/18/2005  Job:  [id number]   cc:   Milus Mallick. Lodema Hong, M.D.  Fax: [phone]   Dani Gobble, MD  Fax: [phone]

## 2010-11-12 NOTE — Consult Note (Signed)
NAME:  [name], [name]                       ACCOUNT NO.:  [id number]   MEDICAL RECORD NO.:  [id number]                   PATIENT TYPE:   LOCATION:                                       FACILITY:   PHYSICIAN:  Aundra Dubin, M.D.            DATE OF BIRTH:  09/24/1929   DATE OF CONSULTATION:  06/06/2002  DATE OF DISCHARGE:                                   CONSULTATION   CHIEF COMPLAINT:  Rheumatoid arthritis.   HISTORY OF PRESENT ILLNESS:  The patient returns reporting she is doing  quite well.  She has had no particular soreness or flaring.  Her hands and  wrists and knees are particularly better.  Her weight is up 8 pounds and she  says that she must have eaten too much over Thanksgiving.  There is no  polyuria or polydipsia, rashes, URIs, fever, coughs, shortness of breath,  stomatitis, or diarrhea.  Her overall pain level is quite low and is mild  aching.   MEDICINES:  1. Prednisone 7.5 mg q.d.  2. Methotrexate 17.5 mg every week.  3. Folic acid 1 mg q.d.  4. ASA 81 mg q.d.  5. Alprazolam 0.25 mg h.s.  6. Aleve two q.d.   PHYSICAL EXAMINATION:  VITAL SIGNS:  Weight 180 pounds, blood pressure  120/70, respirations 16.  GENERAL:  She appears well.  SKIN:  Clear.  NECK:  Negative JVD, no adenopathy.  LUNGS:  Trace rales to the left base and clear otherwise.  LOWER EXTREMITIES:  No edema.  HEART:  Regular, no murmur.  MUSCULOSKELETAL:  She has the decided changes of synovitis to the MCPs and  wrists which are cool and nontender.  Elbows extend fully.  Shoulders move  well with slight stiffness.  Back nontender.  The knees, ankles, and feet  have a good range of motion and show no active arthritis.   ASSESSMENT AND PLAN:  Rheumatoid arthritis.  She is stable and the plan is  to continue 17.5 mg every week and lower the prednisone to 5 mg q.d.  As I  see her back and we are on into the spring I will try and lower the  prednisone and hopefully stop it.  She is  cautioned about the weight gain  she has incurred.  She will try and eat some  less.  She did not return for labs in November.  Labs on March 29, 2002  showed a WBC 5.5, HGB 13.3, PLT 371, albumin 3.5, AST 15, creatinine 0.7.  We will check labs today and again in two months.   She will return in three months.                                               Aundra Dubin, M.D.  WWT/MEDQ  D:  06/06/2002  T:  06/06/2002  Job:  [id number]   cc:   Annia Friendly. Loleta Chance, M.D.  [address]  [place]  [place] [address]  Fax: [phone]

## 2010-11-12 NOTE — Consult Note (Signed)
   NAME:  [name], [name]                       ACCOUNT NO.:  [id number]   MEDICAL RECORD NO.:  [id number]                   PATIENT TYPE:  OUT   LOCATION:  RAD                                  FACILITY:  [facility]   PHYSICIAN:  Aundra Dubin, M.D.            DATE OF BIRTH:  04/02/1930   DATE OF CONSULTATION:  03/13/2003  DATE OF DISCHARGE:                                   CONSULTATION   CHIEF COMPLAINT:  Rheumatoid arthritis.   HISTORY OF PRESENT ILLNESS:  [name] returns for routine follow up for  her RA and is doing well.  She has had no significant flare.  She is off of  prednisone and her weight has dropped by a couple of pounds.  She reports  that she is feeling well.  She has had no URI's, fever, cough, shortness of  breath.   MEDICATIONS:  1. Methotrexate 12.5 mg q.week.  2. Folic acid 1 mg daily.  3. Aleve p.r.n.  4. Xanax 0.25 mg p.r.n.  5. ASA 81 mg daily.  6. Benicar/HCTZ 20/12.5 mg daily.   PHYSICAL EXAMINATION:  VITAL SIGNS:  Weight 170 pounds, blood pressure  124/70, respirations 18.  GENERAL APPEARANCE:  She appears well.  LUNGS:  Clear.  HEART:  Regular, no murmur.  EXTREMITIES:  Lower extremities have no edema.  MUSCULOSKELETAL:  She continues with the significant MCP synovitis and mild  ulnar deviation which is nontender.  The wrists have chronic swelling, but  are cool and nontender.  Elbows extend fully.  Shoulders move with mild  stiffness.  Back is nontender.  Knees are cool and flex easily to 130  degrees.  Ankles and feet were nontender.   ASSESSMENT/PLAN:  Rheumatoid arthritis.  She is stable and will continue on  the methotrexate 12.5 mg q.week.   LABORATORY DATA:  Labs checked on February 13, 2003, showed an albumin 3.6,  AST 14, creatinine 0.7, WBC 3.9, HGB 10.8, MCV 89, platelets 299.  Her  anemia is likely related to the RA.  She asked about continuing to take  iron, and I have said that she can stop this presently.    FOLLOWUP:  She  will return for repeat labs about October 22.  I will see her  back in mid January at my [place] office.                                               Aundra Dubin, M.D.    WWT/MEDQ  D:  03/13/2003  T:  03/13/2003  Job:  [id number]   cc:   Milus Mallick. Lodema Hong, M.D.  [address]  [place], [place] [address]  Fax: [phone]

## 2010-11-12 NOTE — Consult Note (Signed)
NAME:  [name], [name]                       ACCOUNT NO.:  [id number]   MEDICAL RECORD NO.:  [id number]                   PATIENT TYPE:   LOCATION:                                       FACILITY:   PHYSICIAN:  Aundra Dubin, M.D.            DATE OF BIRTH:  [birth year]   DATE OF CONSULTATION:  08/29/2002  DATE OF DISCHARGE:                                   CONSULTATION   CHIEF COMPLAINT:  Rheumatoid arthritis.   HISTORY OF PRESENT ILLNESS:  The patient's labs on August 01, 2002 showed a  platelet count of 64, HGB 11.8, and WBC 4.9.  Other labs showed a creatinine  of 0.7, albumin 3.4, AST 15.  She was contacted and the methotrexate was  lowered to 12.5 mg each week.  She is doing well concerning her joints.  She  had some achiness and stiffness to the hands and wrists and hot water helps  this in the mornings.  She is stiff in the mornings for about 30 minutes.  Her weight is stable.  Her main complaint today is some swishing or roaring  to her left ear.  She also has some discomfort to the right side of her  neck.  There have been no URIs, fever, cough, shortness of breath,  stomatitis, rashes, or true headaches.   PHYSICAL EXAMINATION:  VITAL SIGNS:  Weight 179 pounds, blood pressure  100/60, respirations 16.  GENERAL:  She appears well.  LUNGS:  Clear.  NECK:  Negative JVD.  HEART:  Regular, no murmur.  LOWER EXTREMITIES:  No edema.  BACK:  Nontender.  MUSCULOSKELETAL:  She continues with the decided synovitis to the PIPs,  MCPs, and wrists, but these areas are cool and have minor tenderness.  Elbows and shoulders with good range of motion.  The neck has some slight  tenderness to the right side of the head and posterior head and neck area.  The knees are cool and flex well with slight crepitation.  The ankles and  feet were nontender.   ASSESSMENT AND PLAN:  1. Rheumatoid arthritis.  We will need to check labs today to see if we can     increase the methotrexate  or not.  She is doing overall well.  She asked     to be off of the prednisone, which I am hopeful we can do.  She will     lower the prednisone to 5 mg every-other day until the pills are gone in     about five weeks, then she will stop this.  2. Thrombocytopenia.  Will check the CBC today.  She is having no clinical     bleeding of gums or elsewhere.  3.     Roaring to the left ear.  She says this is not ringing.  I am not quite sure     what this is and will not start into a workup.  She will return in three months.                                               Aundra Dubin, M.D.    WWT/MEDQ  D:  08/29/2002  T:  08/29/2002  Job:  [id number]   cc:   Annia Friendly. Loleta Chance, M.D.  [address]  [place]  [place] [address]  Fax: [phone]

## 2010-11-12 NOTE — H&P (Signed)
NAMEJENNAFER, [name]                         ACCOUNT NO.:  [id number]   MEDICAL RECORD NO.:  [id number]                  PATIENT TYPE:   LOCATION:                                       FACILITY:   PHYSICIAN:  Hanley Hays. Dechurch, M.D.           DATE OF BIRTH:  11/12/1929   DATE OF ADMISSION:  DATE OF DISCHARGE:                                HISTORY & PHYSICAL   HISTORY OF PRESENT ILLNESS:  A 77 year old African American female, followed  by Milus Mallick. Lodema Hong, M.D., with past medical history of rheumatoid  arthritis and hypertension, who actually has been doing quite well and who  had just returned to [place] on a senior citizens [employer] after a shopping  trip.  She states she has felt fine.  She had actually stood up to walk off  the bus and was assisting another person by getting her shopping bag, went  to walk off the bus and she feels she hit her head on a bar on the right  side of the [employer], hitting herself so hard that she fell back in the seat and  momentarily may have lost consciousness, although the circumstances were  unclear.  There was a lot of commotion.  She had no presyncopal symptoms.  She has had no shortness of breath, no chest pain, no changes preceding this  episode, nor does she have any now.  She does complain of some pain on the  right side of her head.  She was initially taken home by EMS to get her  husband and then brought to the emergency room in a private vehicle.  The  patient has remained stable here in the emergency room, but because of the  possible head injury and concussion and the fact that she lives with an  elderly spouse, it is felt admission for observation to rule out possible  concussion is warranted.   PAST MEDICAL HISTORY:  1. Rheumatoid arthritis without secondary disease.  2. Hypertension.   PAST SURGICAL HISTORY:  Right foot surgery.   ALLERGIES:  None known.   MEDICATIONS:  1. Benicar.  2. Hydrochlorothiazide.  3. Methotrexate  12.5 mg q Friday.  4. Folic acid, one daily.  5. Medication 0.25 mg p.r.n. anxiety which she uses infrequently.  6. Baby aspirin one daily.  7. Over-the-counter Glucosamine product.  8. Aleve, two every other day.   FAMILY MEDICAL HISTORY:  Noncontributory.   SOCIAL HISTORY:  She lives with her 60 year old spouse.  She has three  children.  Two sons and a daughter who lives in [place].  No alcohol or  tobacco abuse.   REVIEW OF SYSTEMS:  GI:  Negative.  GU:  Negative.  CARDIOVASCULAR:  Negative.  RESPIRATORY:  Negative.  No history of PE.  Despite her  rheumatoid arthritis, she remains quite active and has actually been feeling  really well.   PHYSICAL EXAMINATION:  GENERAL:  Well-developed, well-nourished female in no  distress.  Alert, appropriate, nonfocal neurologic examination.  Cooperative  and able to move about on the stretcher and who has no complaints.  VITAL SIGNS:  Blood pressure 146/61, pulse is in the 60's and regular.  There is no orthostatic change noted.  NECK:  Supple, no JVD, adenopathy, thyromegaly, no bruits.  HEENT:  Oropharynx moist.  LUNGS:  Clear.  HEART:  Regular rate and rhythm, no murmur, gallop or rub.  ABDOMEN:  Soft and nontender.  No pulsatile mass.  EXTREMITIES:  Without clubbing, cyanosis.  No edema.  She has typical  deformities of her hands and feet secondary to rheumatoid arthritis.  No  acute synovitis is noted.   ASSESSMENT/PLAN:  Probable concussion in this 77 year old lady, who is  otherwise, healthy and currently very stable.  I did not mention above that  she does have a tender area in her right parietal scalp with what feels like  subcutaneous hematoma, although I cannot see any ecchymoses.  I doubt this  is a new central nervous system event or other.  Her laboratories are  completely normal.  Hemoglobin is slightly low at 11 but was lower actually  in October.  Everything else looks alright.  Will check a D-dimer for  completeness  sake, given the fact she had been on a bus trip, but even with  that, she has only driven to [place].  Plan was discussed with the  patient and she is agreeable.     ___________________________________________                                         Hanley Hays. Josefine Class, M.D.   FED/MEDQ  D:  06/11/2003  T:  06/12/2003  Job:  [id number]

## 2010-11-12 NOTE — Op Note (Signed)
NAME:  [name], [name]                      ACCOUNT NO.:  [id number]   MEDICAL RECORD NO.:  [id number]                  PATIENT TYPE:   LOCATION:                                       FACILITY:   PHYSICIAN:  R. Roetta Sessions, M.D.              DATE OF BIRTH:   DATE OF PROCEDURE:  12/18/2003  DATE OF DISCHARGE:                                 OPERATIVE REPORT   PROCEDURE:  Screening colonoscopy.   ENDOSCOPIST:  Gerrit Friends. Rourk, M.D.   INDICATIONS FOR PROCEDURE:  The patient is a 78 year old lady devoid of any  lower GI tract symptoms. She has never had her colon imaged. She is now  referred out the courtesy of Dr. Syliva Overman for screening colonoscopy.  There is no family history of colorectal neoplasia.  Colonoscopy is now  being done as a screening maneuver.  The approach has been discussed with  the patient at length.  The potential risks, benefits, and alternatives have  been reviewed; questions answered.  She is agreeable.   PROCEDURE NOTE:  O2 saturation, blood pressure, pulse and respirations were  monitored throughout the entire procedure.  Conscious sedation: Versed 3 mg IV, Demerol 50 mg IV in divided doses.   INSTRUMENT:  Olympus video chip system.   FINDINGS:  Digital rectal exam revealed no abnormalities.   ENDOSCOPIC FINDINGS:  The prep was good.   RECTUM:  Examination of the rectal mucosa including the retroflex view of  the anal verge revealed no abnormalities.   COLON:  The colonic mucosa was surveyed from the rectosigmoid junction  through the left transverse and right colon to the area of the appendiceal  orifice, ileocecal valve, and cecum.  These structures were well seen and  photographed for the record.   From this level the scope was slowly withdrawn; and all previously mentioned  mucosal surfaces were again seen.  The colon was tortuous and elongated;  however, the mucosa appeared entirely normal. The patient tolerated the  procedure well  and was reacted in endoscopy.   IMPRESSION:  1. Normal rectum.  2. Long, tortuous, but otherwise normal appearing colon.   RECOMMENDATIONS:  1. One more colonoscopy in 10 years if the patient's health permits.      ___________________________________________                                            Jonathon Bellows, M.D.   RMR/MEDQ  D:  12/18/2003  T:  12/18/2003  Job:  [id number]   cc:   Milus Mallick. Lodema Hong, M.D.  [address]  [place], [place] [address]  Fax: [phone]

## 2010-11-12 NOTE — Consult Note (Signed)
NAME:  [name], [name] NO.:  [id number]   MEDICAL RECORD NO.:  [id number]                   PATIENT TYPE:   LOCATION:                                       FACILITY:   PHYSICIAN:  Aundra Dubin, M.D.            DATE OF BIRTH:   DATE OF CONSULTATION:  11/21/2002  DATE OF DISCHARGE:                                   CONSULTATION   CHIEF COMPLAINT:  Rheumatoid arthritis.   HISTORY OF PRESENT ILLNESS:  [name] returns reporting that she is  feeling well and does not have the significant achyness.  She feels that she  is doing quite well.  She had low platelets in February and labs on September 29, 2002 showed a TLT 275, WBC 5.9, HGB 11.7.  Other labs showed an AST 15,  creatinine 0.7, albumin 3.8.   She has minor morning stiffness.  When she does ache, her hands are the  worst areas.  She has had no URIs, fever, cough, nausea, stomatitis,  shortness of breath, rashes, headaches or vision changes.   MEDICINES:  1. Benicar/HCTZ 20/12.5 mg daily.  2. Prednisone off.  3. Methotrexate 12.5 mg every week.  4. Folic acid 1 mg daily.  5. Aleve 2 q.a.m.  6. Xanax 0.25 mg p.r.n.  7. ASA 81 mg daily.   PHYSICAL EXAMINATION:  VITAL SIGNS:  Weight 172 pounds.  Blood pressure  120/80, respiratory rate 16.  GENERAL:  She appears well.  LUNGS:  Clear.  NECK:  Negative JVD.  HEART:  Regular, no murmur.  EXTREMITIES:  Lower extremities no edema.  BACK:  Nontender.  MUSCULOSKELETAL:  She has the continued significant chronic synovitis of the  MCPs with ulnar deviation.  The wrists also show chronic synovitis and both  of these areas have mild tenderness.  The elbows extend almost fully and  they are cool.  Shoulders have stiffness but are nontender.  Knees are cool  and flex easily to 130 degrees.  The ankles a day feet have arthritic  changes, but have mild tenderness.   ASSESSMENT AND PLAN:  1. Rheumatoid arthritis. I suspect she would do better on a  higher dose of     methotrexate, but I have had some resistance with her of taking more.     Hopefully she will continue with the 5 tablets each week.  We will check     labs today and then again in 3 months.  2. Recent hypertension.  She is now on Benicar.  3. She will return in 4 months.                                               Aundra Dubin, M.D.    WWT/MEDQ  D:  11/21/2002  T:  11/21/2002  Job:  [id number]   cc:   Milus Mallick. Lodema Hong, M.D.  [address]  [place], [place] [address]  Fax: [phone]

## 2010-11-12 NOTE — Discharge Summary (Signed)
NAME:  [name], [name]                       ACCOUNT NO.:  [id number]   MEDICAL RECORD NO.:  [id number]                   PATIENT TYPE:  INP   LOCATION:  6529                                 FACILITY:  [facility]   PHYSICIAN:  Darlin Priestly, M.D.             DATE OF BIRTH:  12/19/1929   DATE OF ADMISSION:  08/05/2003  DATE OF DISCHARGE:  08/06/2003                                 DISCHARGE SUMMARY   DISCHARGE DIAGNOSES:  1. Coronary artery disease, status post cardiac catheterization this     admission and stenting with Cypher stent of the left circumflex mid     portion.  2. Hypertension.  3. Rheumatoid arthritis.  4. History of isolated syncopal episodes.  5. Right carotid bruit.   HISTORY OF PRESENT ILLNESS AND HOSPITAL COURSE:  This is a 77 year old lady  who presented to the office and was assisted by Dr. Domingo Sep.  She underwent  cardiac catheterization to assess symptoms of unstable angina.  Cardiolites  revealed an ejection fraction of 73% and mild anterolateral ischemia.  To  further evaluate possible coronary artery disease, the patient was scheduled  for elective cardiac catheterization with Dr. Jenne Campus.   She presented to the hospital through the short-stay unit for elective heart  catheterization and it was performed on August 05, 2003.   At the time of dictation, the transcription of the heart catheterization  with full report is not filed in the chart, but a sketch and a diagram by  Dr. Jenne Campus performed after the catheterization revealed a high-grade lesion  in the mid portion of the left circumflex.  The patient underwent  angioplasty with stenting using Cypher stent of the stenotic lesion with  reduction of stenosis from 90% to less than 10%.  She tolerated the  procedure well and the next morning was assessed by Dr. Allyson Sabal.  Her groin  was stable with no signs of ecchymosis, bleeding, or hematoma.  She was  deemed to be stable for discharge home on aspirin  and Plavix.  She has a  followup appointment with Dr. Domingo Sep in two weeks post discharge in  [place], [place].   DISCHARGE ACTIVITIES:  No driving.  No lifting greater than 5 pounds.  No  strenuous activities for three days post catheterization.   DISCHARGE MEDICATIONS:  1. Zocor 20 mg daily.  2. Toprol XL 25 mg daily.  3. Aspirin 81 mg daily.  4. Plavix 75 mg daily.  5. Folic acid 1 g once a day.  6. Methotrexate, resume home dose.  7. Nitroglycerin p.r.n.   DISCHARGE DIET:  Low-cholesterol, low-fat diet.   FOLLOWUP:  The patient was instructed to report any problems with the  puncture site to our office.  The number was provided.  She already has a  scheduled appointment for followup with Dr. Domingo Sep.  She was instructed to  keep that appointment.   HOSPITAL LABORATORIES:  Post procedure laboratories are  not filed in the  chart, so they are not available to me at the time of discharge, but Dr.  Allyson Sabal mentioned in his note while rounding on the patient the day of her  discharge that her laboratories were within normal limits.      Raymon Mutton, P.A.                    Darlin Priestly, M.D.    MK/MEDQ  D:  08/29/2003  T:  08/31/2003  Job:  [id number]   cc:   [facility]

## 2010-11-12 NOTE — Consult Note (Signed)
   NAME:  [name], [name] NO.:  [id number]   MEDICAL RECORD NO.:  [id number]                   PATIENT TYPE:   LOCATION:                                       FACILITY:   PHYSICIAN:  Aundra Dubin, M.D.            DATE OF BIRTH:   DATE OF CONSULTATION:  03/28/2002  DATE OF DISCHARGE:                                   CONSULTATION   CHIEF COMPLAINT:  Rheumatoid arthritis.   HISTORY OF PRESENT ILLNESS:  The patient returns reporting that she is  feeling considerably better.  The achiness and soreness to the hands,  wrists, and knees is considerably improved.  She had some vague nausea the  first one or two times she took the methotrexate but that is not present at  this time.  Her weight is stable, although up 1 pound.  There is no  polyuria, polydipsia, or back pain.  She also denies fever, cough, URI's,  stomatitis, or shortness of breath .   MEDICATIONS:  1. Methotrexate 12.5 mg weekly.  2. Prednisone 10 mg q.d.  3. Folic acid 1 mg q.d.  4. Dulcolax p.r.n.  5. Aspirin 81 mg q.d.   PHYSICAL EXAMINATION:  VITAL SIGNS:  Weight 172 pounds, blood pressure  110/68, respirations 16.  GENERAL:  She appears well.  LUNGS:  Clear.  HEART:  Regular.  EXTREMITIES:  Lower extremities:  Trace edema.  MUSCULOSKELETAL:  She continues to have significant synovitis of the hands  and wrists but it is much less tender and cool today.  The elbows extend  almost fully.  Shoulders have stiffness and a mild decreased range of  motion.  Back nontender.  The knees are hypertropic but flex more easily and  are less tender.  Ankles and feet were nontender.   ASSESSMENT AND PLAN:  1. Rheumatoid arthritis.  She is improving with the methotrexate.  We will     check usual labs today.  She will     increase the methotrexate to 17.5 mg weekly.  The prednisone is being     lowered to 7.5 mg q.d.  2. Constipation.  She asked about stool softeners and I have  recommended     Colace 100 mg b.i.d.   She will return in two months and we will also check labs four weeks from  this time.                                               Aundra Dubin, M.D.    WWT/MEDQ  D:  03/28/2002  T:  04/01/2002  Job:  [id number]   cc:   Annia Friendly. Loleta Chance, M.D.  [id number]  [place]  [place] [address]  Fax: [phone]

## 2010-11-12 NOTE — Consult Note (Signed)
[facility]  Patient:    ESLI, CLEMENTS Visit Number: [id number] MRN: [id number]          Service Type: RHE Location: [facility] Attending Physician:  Aundra Dubin Dictated by:   Nathaneil Canary, M.D. Proc. Date: 11/15/01 Admit Date:  11/15/2001                            Consultation Report  CHIEF COMPLAINT:  Rheumatoid arthritis right knee.  Left leg numbness.  HISTORY OF PRESENT ILLNESS:  Ms. Bussa returns for follow-up concerning her arthritis and other arthralgias.  She is taking 10 mg of prednisone at this time and feels considerably better.  She is very pleased at how the right knee is doing.  She has kept her weight stable.  She is up one pound.  Her hands and wrists are considerably better.  She stills has the numbness to the left leg and a burning sensation.  Labs from October 11, 2001 showed an RF of 1070 (0-20), ESR 95, WBC 6.2, hemoglobin 10.9, platelets 410,000.  Glucose 83, albumin 3.2, ALT 65, AST 26. She had her hands x-rayed which showed changes consistent with rheumatoid arthritis bilaterally.  I have not been able to review the x-rays yet.  She has also had the lumbar MRI which showed mild diffuse central disc bulging at L5 and S1 but there was no HNP or spinal stenosis.  MEDICATIONS: 1. Prednisone 10 mg q.d. 2. She is off Arthrotec. 3. Darvocet b.i.d. 4. Osteo-Biflex. 5. ASA 81 mg q.d.  PHYSICAL EXAMINATION:  VITAL SIGNS:  Weight 174 pounds.  Blood pressure 140/70, respirations 16.  GENERAL:  She appears well.  LUNGS:  Clear.  HEART:  Regular.  No murmur.  EXTREMITIES:  No edema.  MUSCULOSKELETAL:  The hands continue to show significant chronic synovitis to the MCPs, PIPs, and wrists; however, they are cool and less tender than before.  Wrists have a full range of motion.  The elbows have slight flexion contractures and no nodules.  The shoulders are able to abduct to about 90 degrees bilaterally.  BACK:   Nontender.  EXTREMITIES:  The right knee still is quite limited in range of motion and is tender.  Left knee flexes better to 120 degrees.  The ankles and feet have mild tenderness.  ASSESSMENT/PLAN: 1. Rheumatoid arthritis:  I began discussing the possible use of methotrexate    and Ms. Zegarra had problems with this.  She said that Dr. Fannie Knee had placed    her on this and it bothered her stomach.  Another point she said that she    had thrown up blood.  I believe she has never worked with a rheumatologist    before as best I know.  She did say that possibly doctors at [facility]    put her on this.  Bleeding from the stomach is not a side-effect of    methotrexate.  I have not found it conducive to further go forward with    trying to place her on this medicine.  Plaquenil and sulfasalazine    may be reasonable but they are not really strong enough for the type of    arthritis that she is exhibiting.  Other medicines such as Arava,    Embeline, Remicade are quite extensive.  The plan is to merely lower the    prednisone to 5 mg a day and this will run out with time.  If she is    worsening severely, she will call back for an earlier appointment. 2. Left leg numbness:  The MRI shows only a very slight bulge.  I am not    sure why she is having this leg pain.  Possibly it could be a meralgia    paresthetica.  For further evaluation, she will need to be seen by a    neurologist. 3. I will schedule her back for three months. Dictated by:   Nathaneil Canary, M.D. Attending Physician:  Aundra Dubin DD:  11/15/01 TD:  11/18/01 Job: [id number]

## 2010-11-12 NOTE — Procedures (Signed)
   NAME:  [name], [name]                       ACCOUNT NO.:  [id number]   MEDICAL RECORD NO.:  [id number]                   PATIENT TYPE:  OUT   LOCATION:  RAD                                  FACILITY:  [facility]   PHYSICIAN:  Gerrit Friends. Dietrich Pates, M.D. [facility]        DATE OF BIRTH:  1930/03/14   DATE OF PROCEDURE:  02/12/2002  DATE OF DISCHARGE:                                  ECHOCARDIOGRAM   CLINICAL DATA:  A 77 year old woman with pulmonary edema.   1. Technically adequate echocardiographic study.  2. Mild left atrial enlargement; right atrial size at the upper limit of     normal.  Normal right ventricular size and function.  3. Mild aortic valvular sclerosis with mild annular calcification.  4. Delicate mitral valve with mild annular calcification and trivial     regurgitation.  5. Normal tricuspid valve.  6. Normal internal dimension of the left ventricle with borderline LVH.     Normal regional and global LV systolic function.  7. Normal IVC.                                                    Gerrit Friends. Dietrich Pates, M.D. [facility]    RMR/MEDQ  D:  02/12/2002  T:  02/13/2002  Job:  [id number]

## 2010-11-12 NOTE — Procedures (Signed)
   NAMEMELIA, HOPES                         ACCOUNT NO.:  [id number]   MEDICAL RECORD NO.:  [id number]                   PATIENT TYPE:   LOCATION:                                       FACILITY:   PHYSICIAN:  Fredirick Maudlin, M.D.              DATE OF BIRTH:   DATE OF PROCEDURE:  DATE OF DISCHARGE:                                EKG INTERPRETATION   The rhythm was a sinus rhythm with a rate in the 60s.  Normal  EKG.                                               Fredirick Maudlin, M.D.    ELH/MEDQ  D:  02/11/2002  T:  02/12/2002  Job:  [id number]

## 2010-11-12 NOTE — H&P (Signed)
NAMEVANETTE, [name]             ACCOUNT NO.:  [id number]   MEDICAL RECORD NO.:  [id number]          PATIENT TYPE:  INP   LOCATION:  A215                          FACILITY:  [facility]   PHYSICIAN:  Margaretmary Dys, M.D.DATE OF BIRTH:  02/23/1930   DATE OF ADMISSION:  06/05/2006  DATE OF DISCHARGE:  LH                              HISTORY & PHYSICAL   PRIMARY CARE PHYSICIAN:  Milus Mallick. Lodema Hong, M.D.   ADMISSION DIAGNOSES:  1. Chest pain, rule out myocardial infarction.  2. History of coronary artery disease, status post stent placement of      the left circumflex midportion in March of 2005.  3. History of syncopal episodes.  4. History of hypertension.  5. History of anxiety.   CHIEF COMPLAINT:  Chest pain today.   HISTORY OF PRESENT ILLNESS:  [name] is a 77 year old African-  American female who presented to the emergency room with complaints of  substernal chest pain that started about 3 p.m. today.  The patient said  when she woke up this morning she was told that a friend of hers had  passed away and that provided tremendous stress.   She subsequently started noticing some chest pain.  When the patient had  a stent placed 2 years ago, she says she did not have any chest pain.  She had a cardiac catheterization as part of her workup for syncopal  episodes.  She denies any recurrent angina or pain at home.  She  received some nitroglycerin in the emergency room which provided her  with some relief.  She denies any fevers or chills.  No cough and no  shortness of breath.  She denies the pain was radiating to her chest,  jaw, or shoulder.  She had no diaphoresis.  She has otherwise felt  pretty good.  She received some morphine and as mentioned above some  nitroglycerin in the emergency room with significant improvement in her  symptoms.   Due to her prior cardiac history, the patient is now being admitted for  further evaluation and management.  Her EKG was noted to  be unremarkable  in the emergency room and so was her chest x-ray.   REVIEW OF SYSTEMS:  10-point review of systems is otherwise negative  except as mentioned in history of present illness.   PAST MEDICAL HISTORY:  1. Coronary artery disease, status post Cypher stent of the left      circumflex midportion.  2. Hypertension.  3. Rheumatoid arthritis.  4. History of syncopal episode.  5. History of carotid bruits.  6. History of dyslipidemia.   MEDICATIONS:  1. Lipitor 10 mg once a day.  2. Toprol XL 50 mg p.o. once a day.  3. Plavix 75 mg p.o. once a day.  4. Folic acid 1 mg p.o. once a day.  5. Methotrexate 10 mg p.o. once a week, every Friday.  6. Alprazolam 0.25 mg q.4 hours p.r.n. as needed for anxiety.  7. Baby aspirin 81 mg once a day.   ALLERGIES:  No known drug allergies.   FAMILY HISTORY:  Noncontributory.   SOCIAL  HISTORY:  The patient lives with her 70 year old husband.  She  has three children and one passed away recently.  She denies any alcohol  or tobacco abuse.  She is independent of activities of daily living and  continues to drive and go to church.  She is not significantly limited  by her rheumatoid arthritis.   PHYSICAL EXAMINATION:  GENERAL:  She is alert, comfortable, pleasant,  and not in acute distress.  VITAL SIGNS:  On arrival in the emergency room, blood pressure was  146/71, pulse 64, respirations 20, temperature 98.1, and oxygen  saturation was 100% on room air.  HEENT:  Normocephalic and atraumatic.  Oral mucosa was moist with no  exudates.  NECK:  Supple, no JVD, no lymphadenopathy.  LUNGS:  Clear clinically with good air entry bilaterally.  HEART:  S1 and S2 regular, no S3, S4, gallops, or rubs.  ABDOMEN:  Soft and nontender.  Bowel sounds positive.  No masses  palpable.  EXTREMITIES:  No pitting pedal edema.  No calf induration or tenderness  was noted.  MUSCULOSKELETAL:  The patient had some reproducible tenderness in the  anterior  chest wall over the sternum.   LABORATORY DATA/DIAGNOSTIC DATA:  A 12-lead EKG showed normal sinus  rhythm with a rate of 62 with no acute ST-T changes.  Chest x-ray was  normal with no acute cardiopulmonary changes.   White blood cell count was 4.4, hemoglobin 11, hematocrit 33.3, platelet  count 306 with no left shift. PT 13.6, INR 1.0, sodium 130, potassium  3.8, chloride 95, CO2 26, glucose 148, BUN 9, creatinine 0.7, total  bilirubin 0.5, AST 24, ALT 14.   Admission cardiac markers were negative.   ASSESSMENT:  A 77 year old African-American female presented with chest  pain.  The chest pain appears to be atypical at this time.  The plan is  to admit her to a monitored bed.  We will obtain serial cardiac enzymes  and also check a lipid profile in the morning.  We will request, Dani Gobble, M.D. to see her.  I will resume all of her home medications in  addition to Lovenox for DVT prophylaxis and Protonix for GI prophylaxis.   She otherwise feels well and will continue current therapy.  I will  resume all of her home medications and request Dr. Ermalinda Memos to see her  in the morning.   CODE STATUS:  The patient is a full code.  She currently reports that  her pain is completely gone.  If she has any return of chest pain or any  EKG changes, we will put her on full anticoagulation and start her on  nitroglycerin and also plan for an emergent transfer to [facility] if indicated.      Margaretmary Dys, M.D.  Electronically Signed     AM/MEDQ  D:  06/05/2006  T:  06/06/2006  Job:  [id number]   cc:   Milus Mallick. Lodema Hong, M.D.  Fax: [phone]

## 2010-11-12 NOTE — Cardiovascular Report (Signed)
NAME:  [name], [name]                       ACCOUNT NO.:  [id number]   MEDICAL RECORD NO.:  [id number]                   PATIENT TYPE:  OIB   LOCATION:  6529                                 FACILITY:  [facility]   PHYSICIAN:  Darlin Priestly, M.D.             DATE OF BIRTH:  04-19-1930   DATE OF PROCEDURE:  08/05/2003  DATE OF DISCHARGE:                              CARDIAC CATHETERIZATION   PROCEDURE:  1. Left heart catheterization.  2. Coronary angiography.  3. Left ventriculogram.  4. Left circumflex, mid.     a. Placement of ventricular restent.   CARDIOLOGIST:  Darlin Priestly, M.D.   INDICATIONS:  [name] is a 77 year old female patient of Dr. Syliva Overman and Dr. Kem Boroughs with a history of hypertension, positive  family history of coronary artery disease, recent syncopal episode and chest  pain.  She recently underwent a cardiac scan on July 18, 2003, suggesting  a lateral wall ischemia with normal ejection fraction.  She is now referred  for cardiac catheterization to assess her coronary anatomy.   COMPLICATIONS:  None.   DESCRIPTION OF PROCEDURE:  After given informed written consent, the patient  was brought to the cardiac catheterization lab where his right and left  groins were shaved, prepped, and draped in the usual sterile fashion.  ECG  monitoring was established.  Using modified Seldinger technique, arterial  access was obtained in the right femoral artery and a #6 French arterial  sheath was inserted into the right femoral artery.  Then, 6 French  diagnostic catheters were then used to perform diagnostic angiography.   FINDINGS:  1. This reveals a large left main with no significant disease.  2. The LAD is a large vessel which coursed to the apex and gave rise to two     diagonal branches.  The LAD is noted to be grossly irregular with a 40%     proximal and early mid lesion but no high grade stenosis.  The first     diagonal is a large  vessel which bifurcates in its segment and runs as a     ramus with no significant disease.  The second diagonal is a medium size     vessel with no significant disease.  3. The left circumflex is a large vessel that courses to the AV groove and     gives off to obtuse marginal branches.  The AV groove circumflex is noted     to have a 90% mid vessel lesion at the takeoff of the first small OM.     There is a second OM which bifurcates distally and has no significant     disease.  4. The right coronary artery is a large coronary artery which has no     significant disease.  5. The right coronary artery is a large vessel which is prominent and gives     rise to a  PD at its posterior lateral branch.  There is mild 30% mid RCA     narrowing.  The PDA and posterior lateral branch have no significant     disease.   LEFT VENTRICULOGRAM:  The left ventriculogram reveals a preserved EF of 60%.   HEMODYNAMICS:  Systemic pressure 156/78, LV systemic pressure 150/12, LVEDP  of 18.   INTERVENTIONAL PROCEDURE:  Left circumflex __________via diagnostic  angiography:  A 6 French sheath was exchanged for a #7 [place] sheath and #7  [place] guiding catheter was used to engage the left coronary ostium.  Next, a 0.14 exchange length ATW marker was passed up the guiding catheter  into the proximal left circumflex and positioned in the mid second OM  without difficulty.  Following this, a Cypher 3.0 x 18 mm stent was used to  cross the mid AV groove circumflex lesion.  This stent was then deployed to  a maximum of 10 atmospheres for a total of 3 seconds.  The second inflation  was then performed to a maximum of 12 atmospheres for a total of 30 seconds.  Followup angiogram revealed no evidence of dissection or thrombus with TIMI-  3 flow to the distal vessel.  IV Angiomax was used throughout the case.   Final orthogonal angiograms revealed less than 10% residual stenosis in the  mid circumflex stenotic  lesion with TIMI-3 flow to the distal flow and no  evidence of dissection or thrombus.  At this point, we elected to conclude  the procedure.  All balloons, wires and catheters were removed.  Hemostatic  sheaths were sewn in place.  The patient was transferred to the recovery  room in stable condition.   CONCLUSION:  1. Successful placement of a Cypher 3.0 x 18 mm stent in the mid circumflex     stenotic lesion.  2. Normal left ventricular systolic function.  3. Adjunct use of AngioMax infusion.                                               Darlin Priestly, M.D.    RHM/MEDQ  D:  08/05/2003  T:  08/06/2003  Job:  [id number]   cc:   Milus Mallick. Lodema Hong, M.D.  [address]  [place], [place] [address]  Fax: [phone]   Dani Gobble, MD  Fax: [phone]

## 2010-11-12 NOTE — Discharge Summary (Signed)
NAME:  [name], [name]                       ACCOUNT NO.:  [id number]   MEDICAL RECORD NO.:  [id number]                   PATIENT TYPE:  INP   LOCATION:  A201                                 FACILITY:  [facility]   PHYSICIAN:  Hanley Hays. Dechurch, M.D.           DATE OF BIRTH:  1929/07/27   DATE OF ADMISSION:  06/11/2003  DATE OF DISCHARGE:  06/12/2003                                 DISCHARGE SUMMARY   DIAGNOSES:  1. Possible concussion.  2. Hypertension.  3. Rheumatoid arthritis.  4. Elevated D-dimer of unknown significance.  5. Possible right lower lobe infiltrate versus atelectasis; follow-up     clinically.   HOSPITAL COURSE:  A 77 year old female followed by Dr. Syliva Overman who  was on a bus trip.  She was waiting to get off the bus.  She states that she  believes she hit her head and fell backwards onto the seat not fully losing  consciousness and always being aware of the situation.  She noted pain in  her head.  No witnesses could confirm this.  She did not lose consciousness.  There was no evidence of epileptic behavior.  She was brought to the  hospital after she went home to tell her husband and in the emergency room  was noted to be neurologically intact.  CT of the head was unremarkable.  Cardiac workup was unremarkable as was EKG.  A D-dimer was obtained just to  rule out PE and the fact that she had been on a bus trip - although it was  only to [place] - and due to technical issues it was reported the next  morning to be greater than 2.  Because of this a CT scan was obtained of the  chest.  There was no evidence of pulmonary embolus.  There was suggestion of  some early right lower lobe infiltrate versus atelectasis.  She had no  clinical indication of pneumonia or any other kind of illness aside from her  chronic rheumatoid.  O2 saturations were 98-99% on room air and she  otherwise was doing well.  It was felt that this was not clinically  significant and  could be managed as an outpatient.  She has a follow-up with  Dr. Lodema Hong arranged June 24, 2003.  She was advised to call should  there be any other questions.  She was discharged to home with her usual  medications which include folate, Benicar, Aleve, and methotrexate.     ___________________________________________                                         Hanley Hays. Josefine Class, M.D.   FED/MEDQ  D:  06/12/2003  T:  06/13/2003  Job:  [id number]

## 2010-11-12 NOTE — Consult Note (Signed)
NAME:  [name], [name] NO.:  [id number]   MEDICAL RECORD NO.:  [id number]                   PATIENT TYPE:   LOCATION:                                       FACILITY:   PHYSICIAN:  Aundra Dubin, M.D.            DATE OF BIRTH:   DATE OF CONSULTATION:  02/28/2002  DATE OF DISCHARGE:                                   CONSULTATION   CHIEF COMPLAINT:  Rheumatoid arthritis, legs.   HISTORY OF PRESENT ILLNESS:  The patient returns after three months as  scheduled.  She has been off of the prednisone for somewhat longer than two  months.  She is aching in her legs and says that they burn a great deal  during the night.  She has significant difficulty getting up and down.  She  is scheduled to see Dr. Montez Morita in [place] on March 01, 2002.  Her  hands and wrists ache a great deal.  She has decreased grip strength.  Her  weight is stable but is down 3 pounds.  There has been no polyuria or  polydipsia, back pain, rashes, fever, cough, shortness of breath, or nausea.   PAST MEDICAL HISTORY:  The past medical history is reviewed from the chart  and the note of October 11, 2001 and is unchanged.   MEDICATIONS:  1. Presently off methotrexate.  2. Aspirin 81 mg q.d.  3. Osteo-Bi-Flex t.i.d.  4. Darvocet 1 q.d.  5. Aleve 2 b.i.d.  6. Iron q.d.  7. Calcium q.d.  8. Dulcolax three times per week.   DRUG INTOLERANCES/FAMILY HISTORY/SOCIAL HISTORY:  Reviewed from October 11, 2001 and are unchanged.   PHYSICAL EXAMINATION:  VITAL SIGNS:  Weight 171 pounds.  Blood pressure  108/60, respirations 16.  GENERAL:  She is in no distress.  SKIN:  Clear and dry.  HEENT:  PERRLA/EOMI.  Mouth edentulous.  No obvious ulcers or petechiae.  NECK:  Negative JVD.  Normal thyroid.  LUNGS:  Clear.  HEART:  Regular.  No murmur.  ABDOMEN:  Negative HSM, nontender.  MUSCULOSKELETAL:  Hands and wrists show very significant arthritic changes  consistent with rheumatoid  arthritis.  There is a great deal of synovitis  across the MCP's with mild ulnar deviation.  These areas are warm and  tender.  The wrists show significant warm synovitis that is tender.  Elbows  extend fully and had no nodules.  The shoulders move stiffly with some  decreased range of motion.  Back nontender.  Neck has a good range of motion  and is mildly stiff.  Hips fair range of motion.  The right knee is cool but  does have tenderness with flexion beyond 120 degrees.  The left knee is  swollen, warm, and has a slight effusion and has tenderness beyond 110  degrees flexion.  The ankles have trace edema but are cool and slightly  tender.  The feet show no  arthritic swelling and are nontender.   ASSESSMENT AND PLAN:  1. Rheumatoid arthritis.  This is erosive, rheumatoid factor positive     rheumatoid arthritis.  I have discussed again that she needs to be on a     remitting medicine and she is more consenting to do this today.  The plan     is to give her an injection of 120 mg of Depo-Medrol.  She will then take     prednisone 10 mg q.d.  I have started her on methotrexate 12.5 mg weekly     and folic acid 1 mg q.d.  I have not discussed in full detail with her     potential side effects that I usually do.  She does not drink alcohol.     She will be at low risk of serious problems such as liver cirrhosis.  We     will follow the liver enzymes on an every four week basis for about six     months, along with a CBC for the rare chance of cytopenias.  She already     has known anemia.  She will be at a further but still slight increased     risk of infections including TB, PCP, blasto fungal infections, and     others.  She is at this increased risk because she had been on long-term     prednisone prior to my treating her.  She has been off prednisone now for     greater than two months.  Potential side effects are nausea, hair     thinning which I discussed with her, stomatitis, malaise,  and skin     rashes.  The folic acid can help decrease some of these more minor skin     rashes.  There is also a chance that a rare pulmonary reaction with     fever, cough, and shortness of breath.  2. Anemia.  Labs checked in April showed a hemoglobin 10.9, MCV 84, WBC 6.2,     and platelets elevated at 410.   We will check labs on return in five weeks.                                               Aundra Dubin, M.D.    WWT/MEDQ  D:  02/28/2002  T:  03/01/2002  Job:  [id number]   cc:   Annia Friendly. Loleta Chance, M.D.

## 2010-11-30 ENCOUNTER — Other Ambulatory Visit (HOSPITAL_COMMUNITY): Payer: Self-pay | Admitting: Family Medicine

## 2010-11-30 DIAGNOSIS — Z139 Encounter for screening, unspecified: Secondary | ICD-10-CM

## 2010-12-14 ENCOUNTER — Ambulatory Visit (HOSPITAL_COMMUNITY)
Admission: RE | Admit: 2010-12-14 | Discharge: 2010-12-14 | Disposition: A | Discharge status: Home / Self Care | Payer: Medicare Other | Source: Ambulatory Visit | Attending: Family Medicine | Admitting: Family Medicine

## 2010-12-14 DIAGNOSIS — Z139 Encounter for screening, unspecified: Secondary | ICD-10-CM

## 2010-12-14 DIAGNOSIS — Z1231 Encounter for screening mammogram for malignant neoplasm of breast: Secondary | ICD-10-CM | POA: Insufficient documentation

## 2011-02-17 ENCOUNTER — Inpatient Hospital Stay (HOSPITAL_COMMUNITY)
Admission: EM | Admit: 2011-02-17 | Discharge: 2011-02-22 | DRG: 641 | Disposition: A | Discharge status: Home / Self Care | Payer: Medicare Other | Attending: Family Medicine | Admitting: Family Medicine

## 2011-02-17 DIAGNOSIS — I10 Essential (primary) hypertension: Secondary | ICD-10-CM | POA: Diagnosis present

## 2011-02-17 DIAGNOSIS — R5381 Other malaise: Secondary | ICD-10-CM | POA: Diagnosis present

## 2011-02-17 DIAGNOSIS — Z7982 Long term (current) use of aspirin: Secondary | ICD-10-CM

## 2011-02-17 DIAGNOSIS — R569 Unspecified convulsions: Secondary | ICD-10-CM | POA: Diagnosis present

## 2011-02-17 DIAGNOSIS — E785 Hyperlipidemia, unspecified: Secondary | ICD-10-CM | POA: Diagnosis present

## 2011-02-17 DIAGNOSIS — M069 Rheumatoid arthritis, unspecified: Secondary | ICD-10-CM | POA: Diagnosis present

## 2011-02-17 DIAGNOSIS — R630 Anorexia: Secondary | ICD-10-CM | POA: Diagnosis present

## 2011-02-17 DIAGNOSIS — Y92009 Unspecified place in unspecified non-institutional (private) residence as the place of occurrence of the external cause: Secondary | ICD-10-CM

## 2011-02-17 DIAGNOSIS — F411 Generalized anxiety disorder: Secondary | ICD-10-CM | POA: Diagnosis present

## 2011-02-17 DIAGNOSIS — E7800 Pure hypercholesterolemia, unspecified: Secondary | ICD-10-CM | POA: Diagnosis present

## 2011-02-17 DIAGNOSIS — I2510 Atherosclerotic heart disease of native coronary artery without angina pectoris: Secondary | ICD-10-CM | POA: Diagnosis present

## 2011-02-17 DIAGNOSIS — Z9861 Coronary angioplasty status: Secondary | ICD-10-CM

## 2011-02-17 DIAGNOSIS — E871 Hypo-osmolality and hyponatremia: Principal | ICD-10-CM | POA: Diagnosis present

## 2011-02-17 DIAGNOSIS — T502X5A Adverse effect of carbonic-anhydrase inhibitors, benzothiadiazides and other diuretics, initial encounter: Secondary | ICD-10-CM | POA: Diagnosis present

## 2011-02-17 LAB — BASIC METABOLIC PANEL WITH GFR
BUN: 9 mg/dL (ref 6–23)
CO2: 23 meq/L (ref 19–32)
Calcium: 9.8 mg/dL (ref 8.4–10.5)
Chloride: 79 meq/L — ABNORMAL LOW (ref 96–112)
Creatinine, Ser: <0.47 mg/dL — ABNORMAL LOW (ref 0.50–1.10)
Glucose, Bld: 81 mg/dL (ref 70–99)
Potassium: 4.3 meq/L (ref 3.5–5.1)
Sodium: 111 meq/L — CL (ref 135–145)

## 2011-02-17 LAB — OSMOLALITY, URINE: Osmolality, Ur: 93 mosm/kg — ABNORMAL LOW (ref 390–1090)

## 2011-02-17 LAB — SODIUM, URINE, RANDOM
Sodium, Ur: 29 mEq/L
Sodium, Ur: 21 meq/L

## 2011-02-17 LAB — DIFFERENTIAL
Basophils Absolute: 0 K/uL (ref 0.0–0.1)
Basophils Relative: 0 % (ref 0–1)
Eosinophils Absolute: 0.1 10*3/uL (ref 0.0–0.7)
Eosinophils Relative: 1 % (ref 0–5)
Lymphocytes Relative: 15 % (ref 12–46)
Lymphs Abs: 0.7 K/uL (ref 0.7–4.0)
Monocytes Absolute: 0.6 K/uL (ref 0.1–1.0)
Monocytes Relative: 12 % (ref 3–12)
Neutro Abs: 3.6 10*3/uL (ref 1.7–7.7)
Neutrophils Relative %: 73 % (ref 43–77)

## 2011-02-17 LAB — CBC
HCT: 29.9 % — ABNORMAL LOW (ref 36.0–46.0)
Hemoglobin: 10.8 g/dL — ABNORMAL LOW (ref 12.0–15.0)
MCH: 28.9 pg (ref 26.0–34.0)
MCHC: 36.1 g/dL — ABNORMAL HIGH (ref 30.0–36.0)
MCV: 79.9 fL (ref 78.0–100.0)
Platelets: 298 10*3/uL (ref 150–400)
RBC: 3.74 MIL/uL — ABNORMAL LOW (ref 3.87–5.11)
RDW: 12.5 % (ref 11.5–15.5)
WBC: 4.9 10*3/uL (ref 4.0–10.5)

## 2011-02-17 LAB — URIC ACID: Uric Acid, Serum: 2.4 mg/dL (ref 2.4–7.0)

## 2011-02-17 LAB — TSH: TSH: 2.059 u[IU]/mL (ref 0.350–4.500)

## 2011-02-17 LAB — OSMOLALITY: Osmolality: 243 mOsm/kg — ABNORMAL LOW (ref 275–300)

## 2011-02-18 LAB — BASIC METABOLIC PANEL
BUN: 8 mg/dL (ref 6–23)
Chloride: 92 mEq/L — ABNORMAL LOW (ref 96–112)
Creatinine, Ser: <0.47 mg/dL — ABNORMAL LOW (ref 0.50–1.10)
GFR calc Af Amer: >60 mL/min (ref >60)
GFR calc non Af Amer: >60 mL/min (ref >60)
Glucose, Bld: 135 mg/dL — ABNORMAL HIGH (ref 70–99)
Potassium: 4.2 mEq/L (ref 3.5–5.1)
Sodium: 121 mEq/L — ABNORMAL LOW (ref 135–145)

## 2011-02-18 LAB — LIPID PANEL: Total CHOL/HDL Ratio: 2.7 RATIO

## 2011-02-19 ENCOUNTER — Inpatient Hospital Stay (HOSPITAL_COMMUNITY): Payer: Medicare Other

## 2011-02-19 LAB — CBC
Hemoglobin: 10.2 g/dL — ABNORMAL LOW (ref 12.0–15.0)
MCH: 28.9 pg (ref 26.0–34.0)
RBC: 3.53 MIL/uL — ABNORMAL LOW (ref 3.87–5.11)

## 2011-02-19 LAB — COMPREHENSIVE METABOLIC PANEL
ALT: 9 U/L (ref 0–35)
Alkaline Phosphatase: 53 U/L (ref 39–117)
CO2: 24 mEq/L (ref 19–32)
Glucose, Bld: 89 mg/dL (ref 70–99)
Potassium: 3.9 mEq/L (ref 3.5–5.1)
Sodium: 124 mEq/L — ABNORMAL LOW (ref 135–145)
Total Bilirubin: 0.3 mg/dL (ref 0.3–1.2)

## 2011-02-19 LAB — BASIC METABOLIC PANEL
BUN: 9 mg/dL (ref 6–23)
Calcium: 8.9 mg/dL (ref 8.4–10.5)
GFR calc non Af Amer: >60 mL/min (ref >60)
Glucose, Bld: 111 mg/dL — ABNORMAL HIGH (ref 70–99)

## 2011-02-20 LAB — BASIC METABOLIC PANEL
BUN: 10 mg/dL (ref 6–23)
CO2: 25 mEq/L (ref 19–32)
CO2: 23 mEq/L (ref 19–32)
CO2: 23 mEq/L (ref 19–32)
CO2: 24 mEq/L (ref 19–32)
Chloride: 92 mEq/L — ABNORMAL LOW (ref 96–112)
Chloride: 96 mEq/L (ref 96–112)
Chloride: 94 mEq/L — ABNORMAL LOW (ref 96–112)
Chloride: 94 mEq/L — ABNORMAL LOW (ref 96–112)
Creatinine, Ser: <0.47 mg/dL — ABNORMAL LOW (ref 0.50–1.10)
Glucose, Bld: 80 mg/dL (ref 70–99)
Potassium: 3.8 mEq/L (ref 3.5–5.1)
Potassium: 3.9 mEq/L (ref 3.5–5.1)
Potassium: 3.8 mEq/L (ref 3.5–5.1)
Sodium: 123 mEq/L — ABNORMAL LOW (ref 135–145)
Sodium: 125 mEq/L — ABNORMAL LOW (ref 135–145)

## 2011-02-20 LAB — NA AND K (SODIUM & POTASSIUM), RAND UR
Potassium Urine: 28 mEq/L
Sodium, Ur: 68 mEq/L

## 2011-02-21 LAB — BASIC METABOLIC PANEL
CO2: 26 mEq/L (ref 19–32)
CO2: 27 mEq/L (ref 19–32)
CO2: 27 mEq/L (ref 19–32)
Chloride: 97 mEq/L (ref 96–112)
Chloride: 96 mEq/L (ref 96–112)
Chloride: 98 mEq/L (ref 96–112)
GFR calc non Af Amer: >60 mL/min (ref >60)
Glucose, Bld: 89 mg/dL (ref 70–99)
Glucose, Bld: 96 mg/dL (ref 70–99)
Potassium: 3.4 mEq/L — ABNORMAL LOW (ref 3.5–5.1)
Potassium: 3.4 mEq/L — ABNORMAL LOW (ref 3.5–5.1)
Potassium: 3.8 mEq/L (ref 3.5–5.1)
Potassium: 4.2 mEq/L (ref 3.5–5.1)
Sodium: 126 mEq/L — ABNORMAL LOW (ref 135–145)
Sodium: 127 mEq/L — ABNORMAL LOW (ref 135–145)
Sodium: 128 mEq/L — ABNORMAL LOW (ref 135–145)
Sodium: 129 mEq/L — ABNORMAL LOW (ref 135–145)

## 2011-02-21 LAB — GLUCOSE, CAPILLARY: Glucose-Capillary: 124 mg/dL — ABNORMAL HIGH (ref 70–99)

## 2011-02-21 NOTE — Consult Note (Signed)
[name], DETWILER NO.:  [id number]  MEDICAL RECORD NO.:  [id number]  LOCATION:  3031                         FACILITY:  [facility]  PHYSICIAN:  Maree Krabbe, M.D.DATE OF BIRTH:  Jan 24, 1930  DATE OF CONSULTATION:  02/19/2011 DATE OF DISCHARGE:                                CONSULTATION   REQUESTING PHYSICIAN:  Tarry Kos, MD  REASON FOR CONSULTATION:  Hyponatremia.  HISTORY:  This is a 77 year old white female with a history of high blood pressure, rheumatoid arthritis, and coronary artery disease.  She presented to her primary care physician with generalized weakness and was found to have sodium of 111.  She was admitted to the hospital on the 23rd.  She had recently been started on hydrochlorothiazide according to the admission notes.  The patient was also taking metoprolol, aspirin, calcium carbonate, and vitamins on admission.  Her initial serum sodium was 111 with the urine osmolality of 93.  She received normal saline at 150 mL per hour and sodium improved up to 123 yesterday morning at 5:00 a.m.  Last evening at around 11 pm the patient had 2 brief tonic-clonic seizures.  She received IV Ativan  and her normal saline was stopped and 3% sodium chloride was started. Sodium this morning was 124 and the patient is fully alert and oriented.  The patient denies any previous history of similar problems.  She is a vague historian but the family denies any issues with sodium level problems in the past, prior seizures or kidney disease.  PAST MEDICAL HISTORY:  Coronary artery disease, hypertension, severe rheumatoid arthritis, anxiety disorder, hyperlipidemia.  PAST SURGICAL HISTORY:  Noncontributory.  SOCIAL HISTORY:  The patient is widowed.  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  She is retired.  She has 3 children and lives alone.  She has no history of alcohol or tobacco use.  FAMILY HISTORY:  Noncontributory.  REVIEW OF SYSTEMS:  Denies any  active fever, chills, sweats, headache, visual change, sore throat, difficulty swallowing, chest pain, shortness of breath, abdominal pain, nausea, vomiting, recent diarrheal illness, difficulty voiding or dysuria, joint pain or swelling, focal numbness or weakness, skin rash or itching.  PHYSICAL EXAMINATION:  VITAL SIGNS:  Temperature 98.1, blood pressure 150/70, pulse 60, respirations 18, O2 sat 100% on 2 liters. GENERAL:  The patient is awake, alert, elderly female, pleasant and oriented in no distress. SKIN:  Warm and dry without rash, cyanosis, or edema. HEENT:  PERRLA, EOMI.  Throat was clear. NECK:  Supple without JVD. CHEST:  Clear throughout.  No rales, rhonchi, or wheezing. CARDIAC:  Regular rate and rhythm without murmur, rub, or gallop. ABDOMEN:  Soft, nontender, active bowel sounds.  LABORATORY DATA:  Sodium 124, potassium 3.9, BUN 6, creatinine 0.47, albumin 2.8.  Urine sodium was 21 with repeat 29.  Urine osmolality on admission was 93.  Head CT scan from today was negative and hemoglobin was 10.2 today with normal platelets and normal white count.  IMPRESSION: 1. Hyponatremia likely due to thiazide diuretic recently started.     Thiazide diuretics can cause hyponatremia. The patient's     sodium levels have improved.  The had seizures yesterday after partial  correction of her sodium.  Would recommend stopping 3% saline and      resuming IV normal saline as previous. 2. Seizures, acute.  Provoked seizures due to hyponatremia.  No long-     term medication is needed.  Agree with management with     benzodiazepines as needed. 3. Rheumatoid arthritis longstanding. 4. Hypertension.  I will write an order to the fact that the patient is     intolerant to thiazide diuretics and these should be avoided in the     future.  Hyponatremia typically is recurrent in cases where the     patient's have been rechallenged.     Maree Krabbe, M.D.     RDS/MEDQ  D:   02/19/2011  T:  02/19/2011  Job:  [id number]  Electronically Signed by Delano Metz M.D. on 02/21/2011 08:02:08 AM

## 2011-02-22 LAB — BASIC METABOLIC PANEL
BUN: 12 mg/dL (ref 6–23)
CO2: 27 mEq/L (ref 19–32)
Chloride: 97 mEq/L (ref 96–112)
Chloride: 95 mEq/L — ABNORMAL LOW (ref 96–112)
Creatinine, Ser: <0.47 mg/dL — ABNORMAL LOW (ref 0.50–1.10)
Potassium: 3.7 mEq/L (ref 3.5–5.1)
Potassium: 3.6 mEq/L (ref 3.5–5.1)
Sodium: 128 mEq/L — ABNORMAL LOW (ref 135–145)

## 2011-03-18 NOTE — Discharge Summary (Signed)
[name], [name] NO.:  [id number]  MEDICAL RECORD NO.:  [id number]  LOCATION:  3031                         FACILITY:  [facility]  PHYSICIAN:  Tarry Kos, MD       DATE OF BIRTH:  24-Apr-1930  DATE OF ADMISSION:  02/17/2011 DATE OF DISCHARGE:  02/22/2011                              DISCHARGE SUMMARY   DISCHARGE DIAGNOSES: 1. Severe symptomatic hyponatremia. 2. Seizure activity secondary to severe hyponatremia. 3. Hyponatremia secondary to thiazide diuretics. 4. Weakness secondary to the above which is resolved. 5. Anorexia secondary to above resolved. 6. History of hypertension, stable. 7. History of coronary artery disease, stable. 8. History of rheumatoid arthritis, stable.  SUMMARY OF HOSPITAL COURSE:  [name] is a very pleasant 77 year old African American female who presented to the emergency department on February 17, 2011, because of anorexia and weakness.  She had recently been started on hydrochlorothiazide 12.5 mg a day by her cardiologist and when she was admitted, her sodium level was 111.  She was admitted, placed on IV fluids normal saline and within 24 hours her sodium level was up to 123.  Within the first 48 hours of hospitalization, the patient also had several brief episodes of seizure activity.  Nephrology was then consulted.  It was thought that this was due to hyponatremia. Obviously, there were concerns for cerebral edema.  She had a CT of her brain which was normal.  The patient did not have any subsequent focal neurologic deficits after her seizure activity, so again this was thought to be due more to hyponatremia than any over rapid correction of her sodium causing intracranial edema.  Her sodium continued to improve with normal saline IV fluids, today it is up to 128.  She has been ambulating well.  She has been asymptomatic for several days. Hydrochlorothiazide has been added to her allergy list.  She is to be discharged home  to follow up with her primary care physician.  We have made an appointment with Dr. Loleta Chance this Friday to repeat a BMP.  We will have her records sent to Dr. Tresa Endo, who is her cardiologist along with Dr. Loleta Chance who is her primary care physician as the patient should not be placed on thiazide diuretics again in the future due to the severity of her hyponatremia.  PHYSICAL EXAMINATION:  VITAL SIGNS:  She has been afebrile.  Vital signs have been stable. GENERAL:  Alert and oriented x4.  No apparent distress, cooperative and friendly. HEENT:  Extraocular movements intact.  Pupils equal, reactive to light. Oropharynx clear.  Mucous membranes moist. NECK:  No JVD, no carotid bruits. COR:  Regular rate and rhythm without murmurs, rubs or gallops. CHEST:  Clear to auscultation bilaterally with no wheezes, rhonchi, or rales. ABDOMEN:  Soft, nontender, nondistended.  Positive bowel sounds.  No hepatosplenomegaly. EXTREMITIES:  No clubbing, cyanosis or edema. PSYCH:  Normal affect. NEURO:  No focal neurologic deficits.  Cranial II to XII grossly intact.  The patient is being discharged home in stable condition.  Again, I have stopped her hydrochlorothiazide.  At her followup appointment with her primary care physician which has been made for Friday at 11:45 a.m. she  will need a repeat sodium check and reassessment of her blood pressure.          ______________________________ Tarry Kos, MD     RD/MEDQ  D:  02/22/2011  T:  02/22/2011  Job:  [id number]  cc:   Dr. Wyman Songster, M.D.  Electronically Signed by Tarry Kos MD on 03/18/2011 11:56:16 AM

## 2011-03-21 NOTE — H&P (Signed)
NAMEALBERTO, SCHOCH NO.:  [id number]  MEDICAL RECORD NO.:  [id number]  LOCATION:  [facility]                         FACILITY:  [facility]  PHYSICIAN:  Jonny Ruiz, MD    DATE OF BIRTH:  04/24/1930  DATE OF ADMISSION:  02/17/2011 DATE OF DISCHARGE:                             HISTORY & PHYSICAL   PRIMARY CARE PHYSICIAN:  Annia Friendly. Hill, MD  CHIEF COMPLAINT:  Low-sodium.  HISTORY OF PRESENT ILLNESS:  The patient is a 77 year old black female who with a history of hypertension and lower extremities edema, who was recently started on hydrochlorothiazide 12.5 mg a day and developed anorexia and generalized weakness.  She went to her primary care physician yesterday who would check a BMET and was found with a sodium of 111.  Subsequently, she was contacted today and advised to come to the emergency department for further management.  Today, the patient feels well and voices no complaints.  PAST MEDICAL HISTORY:  Significant for coronary artery disease status post stent, left circumflex.  I was not able to find an echocardiogram on her medical history, hypertension, essential, anxiety disorder, rheumatoid arthritis on no treatment, syncope x1, history of coronary artery disease with a remote carotid echo from 2007, which stated that she has no critical aortic stenosis, hypercholesterolemia, and mammogram performed June 2012 negative.  SOCIAL HISTORY:  The patient is a widow and retired Public librarian.  She had three children, one daughter deceased and her husband passed two years ago.  She denies tobacco, alcohol, or illicit.  She is full code.  REVIEW OF SYSTEMS:  CONSTITUTIONAL:  Denies fever, chills, or sweats. Denies weight changes, but admits to no much of an appetite.  HEENT: Denies headaches, sore throat, nasal discharge, or bleed.  No photophobia.  SKIN:  Denies rashes or lesions.  PULMONARY:  She denies chest pain, shortness of breath, dyspnea on  exertion, orthopnea, nocturia, or PND.  No palpitations.  No recent syncope.  No cough or wheezes.  GU:  Denies dysuria, frequency, or hematuria.  She has occasional urinary incontinence, stress type.  GI:  She denies nausea, vomiting, diarrhea, or constipation.  MUSCULOSKELETAL:  Although she has advanced rheumatoid arthritis, she has no joint pain or swelling. ENDOCRINE:  She denies polyuria, polydipsia, heat, or cold intolerance.  PHYSICAL EXAMINATION:  VITAL SIGNS:  Her temperature is 97.7, pulse 61, respirations 18, blood pressure 108/53 supine, pulse oximetry 100% on room air. GENERAL APPEARANCE:  The patient is a well-preserved African-American female, who appears in no distress.  She is very pleasant and cooperative. HEENT:  NCAT, PERRLA, EOMI.  Sclerae clear. NECK:  Without carotid bruits.  No JVD.  Was not able to palpate lymphadenopathy.  Her thyroid is normal size and nontender. HEART:  Regular S1 and S2 without gallops, murmurs, or rubs. LUNGS:  Clear to auscultation anteriorly and posteriorly. ABDOMEN:  Nondistended with normal bowel sounds, soft, and nontender without organomegaly or masses palpable.  No bruits. EXTREMITIES:  She has 1+ pitting edema bilaterally with no cough tenderness or cords palpated. NEUROLOGICAL:  She is alert and oriented x3 without focal deficits with a strength of 5/5 in the upper and lower extremities.  RADIOLOGY:  None to review.  EKG:  Normal sinus rhythm without ST or T- wave abnormalities.  LABORATORY DATA:  The CBC is essentially with mild anemia, hemoglobin 10.8, hematocrit 29.9, normal WBCs and platelets.  Sodium 111, potassium 4.3, chloride 79, CO2 of 23, BUN 9, creatinine 0.47, glucose 81, calcium 9.8.  ASSESSMENT AND PLAN: 14. A 77 year old black female, presenting with anorexia and     generalized weakness and found with hyponatremia with a sodium of     111, most likely precipitated by hydrochlorothiazide as the     offending  agent.  The plan is to admit the patient to a regular     medical bed and discontinue hydrochlorothiazide and start hydration     with normal saline at 100 mL an hour as she appears euvolemic.  The     workup will include TSH, uric acid, serum osmolality, urine sodium,     urine osmolality, and will review these results to further orders. 2. Hypertension.  She is not hypertensive at the present time.  We     will monitor her blood pressure without hydrochlorothiazide.  She     will continue metoprolol 25 mg b.i.d. 3. Coronary artery disease.  Continue on aspirin 81 mg once a day. 4. Rheumatoid arthritis, no treatment at this time.          ______________________________ Jonny Ruiz, MD     GL/MEDQ  D:  02/17/2011  T:  02/17/2011  Job:  [id number]  cc:   Annia Friendly. Loleta Chance, MD  Electronically Signed by Jonny Ruiz MD on 03/21/2011 04:06:49 PM

## 2011-04-11 LAB — BASIC METABOLIC PANEL
BUN: 10
CO2: 28
Calcium: 9.1
Chloride: 99
GFR calc Af Amer: >60
GFR calc non Af Amer: >60
Potassium: 4
Sodium: 133 — ABNORMAL LOW

## 2011-05-28 DIAGNOSIS — F29 Unspecified psychosis not due to a substance or known physiological condition: Secondary | ICD-10-CM

## 2011-05-28 HISTORY — DX: Unspecified psychosis not due to a substance or known physiological condition: F29

## 2011-06-01 DIAGNOSIS — W19XXXA Unspecified fall, initial encounter: Secondary | ICD-10-CM

## 2011-06-01 HISTORY — DX: Unspecified fall, initial encounter: W19.XXXA

## 2011-06-14 ENCOUNTER — Inpatient Hospital Stay (HOSPITAL_COMMUNITY)
Admission: EM | Admit: 2011-06-14 | Discharge: 2011-06-20 | DRG: 644 | Disposition: A | Discharge status: Home Health | Payer: Medicare Other | Attending: Internal Medicine | Admitting: Internal Medicine

## 2011-06-14 ENCOUNTER — Other Ambulatory Visit: Payer: Self-pay

## 2011-06-14 ENCOUNTER — Emergency Department (HOSPITAL_COMMUNITY): Payer: Medicare Other

## 2011-06-14 ENCOUNTER — Encounter: Payer: Self-pay | Admitting: *Deleted

## 2011-06-14 DIAGNOSIS — E236 Other disorders of pituitary gland: Principal | ICD-10-CM | POA: Diagnosis present

## 2011-06-14 DIAGNOSIS — D649 Anemia, unspecified: Secondary | ICD-10-CM | POA: Diagnosis present

## 2011-06-14 DIAGNOSIS — I2510 Atherosclerotic heart disease of native coronary artery without angina pectoris: Secondary | ICD-10-CM | POA: Diagnosis present

## 2011-06-14 DIAGNOSIS — E871 Hypo-osmolality and hyponatremia: Secondary | ICD-10-CM | POA: Diagnosis present

## 2011-06-14 DIAGNOSIS — I10 Essential (primary) hypertension: Secondary | ICD-10-CM | POA: Diagnosis present

## 2011-06-14 DIAGNOSIS — N390 Urinary tract infection, site not specified: Secondary | ICD-10-CM | POA: Diagnosis present

## 2011-06-14 DIAGNOSIS — M069 Rheumatoid arthritis, unspecified: Secondary | ICD-10-CM | POA: Diagnosis present

## 2011-06-14 DIAGNOSIS — T380X5A Adverse effect of glucocorticoids and synthetic analogues, initial encounter: Secondary | ICD-10-CM | POA: Diagnosis present

## 2011-06-14 DIAGNOSIS — F23 Brief psychotic disorder: Secondary | ICD-10-CM | POA: Diagnosis present

## 2011-06-14 DIAGNOSIS — D72819 Decreased white blood cell count, unspecified: Secondary | ICD-10-CM | POA: Diagnosis present

## 2011-06-14 DIAGNOSIS — F209 Schizophrenia, unspecified: Secondary | ICD-10-CM

## 2011-06-14 HISTORY — DX: Cardiac arrhythmia, unspecified: I49.9

## 2011-06-14 HISTORY — DX: Unspecified urinary incontinence: R32

## 2011-06-14 HISTORY — DX: Unspecified fall, initial encounter: W19.XXXA

## 2011-06-14 HISTORY — DX: Essential (primary) hypertension: I10

## 2011-06-14 LAB — DIFFERENTIAL
Basophils Absolute: 0 10*3/uL (ref 0.0–0.1)
Basophils Relative: 0 % (ref 0–1)
Eosinophils Absolute: 0.1 10*3/uL (ref 0.0–0.7)
Eosinophils Relative: 2 % (ref 0–5)

## 2011-06-14 LAB — CBC
MCH: 29 pg (ref 26.0–34.0)
MCHC: 35 g/dL (ref 30.0–36.0)
MCV: 82.7 fL (ref 78.0–100.0)
Platelets: 296 10*3/uL (ref 150–400)
RDW: 14 % (ref 11.5–15.5)

## 2011-06-14 LAB — COMPREHENSIVE METABOLIC PANEL
ALT: 17 U/L (ref 0–35)
AST: 24 U/L (ref 0–37)
Calcium: 10.1 mg/dL (ref 8.4–10.5)
GFR calc Af Amer: >90 mL/min (ref >90)
Glucose, Bld: 98 mg/dL (ref 70–99)
Sodium: 118 mEq/L — CL (ref 135–145)
Total Protein: 7.7 g/dL (ref 6.0–8.3)

## 2011-06-14 LAB — URINALYSIS, ROUTINE W REFLEX MICROSCOPIC
Glucose, UA: NEGATIVE mg/dL
Ketones, ur: 15 mg/dL — AB
Protein, ur: NEGATIVE mg/dL

## 2011-06-14 LAB — RAPID URINE DRUG SCREEN, HOSP PERFORMED
Amphetamines: NOT DETECTED
Tetrahydrocannabinol: NOT DETECTED

## 2011-06-14 LAB — ETHANOL: Alcohol, Ethyl (B): <11 mg/dL (ref 0–11)

## 2011-06-14 LAB — URINE MICROSCOPIC-ADD ON

## 2011-06-14 MED ORDER — METOPROLOL TARTRATE 25 MG PO TABS
25.0000 mg | ORAL_TABLET | Freq: Two times a day (BID) | ORAL | Status: DC
  Administered 2011-06-14 – 2011-06-20 (×12): 25 mg via ORAL
  Filled 2011-06-14 (×12): qty 1

## 2011-06-14 MED ORDER — DEXTROSE 5 % IV SOLN
1.0000 g | Freq: Once | INTRAVENOUS | Status: AC
  Administered 2011-06-14: 1 g via INTRAVENOUS
  Filled 2011-06-14: qty 10

## 2011-06-14 MED ORDER — HEPARIN SODIUM (PORCINE) 5000 UNIT/ML IJ SOLN
5000.0000 [IU] | Freq: Three times a day (TID) | INTRAMUSCULAR | Status: DC
  Administered 2011-06-14 – 2011-06-20 (×18): 5000 [IU] via SUBCUTANEOUS
  Filled 2011-06-14 (×18): qty 1

## 2011-06-14 MED ORDER — SODIUM CHLORIDE 0.9 % IV BOLUS (SEPSIS)
1000.0000 mL | Freq: Once | INTRAVENOUS | Status: AC
  Administered 2011-06-14: 1000 mL via INTRAVENOUS

## 2011-06-14 MED ORDER — FOLIC ACID 1 MG PO TABS
1.0000 mg | ORAL_TABLET | Freq: Every day | ORAL | Status: DC
  Administered 2011-06-14 – 2011-06-20 (×7): 1 mg via ORAL
  Filled 2011-06-14 (×7): qty 1

## 2011-06-14 MED ORDER — CIPROFLOXACIN IN D5W 400 MG/200ML IV SOLN
400.0000 mg | Freq: Two times a day (BID) | INTRAVENOUS | Status: DC
  Administered 2011-06-14 – 2011-06-18 (×8): 400 mg via INTRAVENOUS
  Filled 2011-06-14 (×11): qty 200

## 2011-06-14 MED ORDER — VITAMIN D3 25 MCG (1000 UNIT) PO TABS
1000.0000 [IU] | ORAL_TABLET | Freq: Every day | ORAL | Status: DC
  Administered 2011-06-15 – 2011-06-20 (×5): 1000 [IU] via ORAL
  Filled 2011-06-14 (×8): qty 1

## 2011-06-14 MED ORDER — ACETAMINOPHEN 650 MG RE SUPP: 650.0000 mg | Freq: Four times a day (QID) | RECTAL | Status: DC | PRN

## 2011-06-14 MED ORDER — VITAMIN C 500 MG PO TABS
500.0000 mg | ORAL_TABLET | Freq: Every day | ORAL | Status: DC
  Administered 2011-06-14 – 2011-06-20 (×7): 500 mg via ORAL
  Filled 2011-06-14 (×7): qty 1

## 2011-06-14 MED ORDER — FERROUS FUMARATE 325 (106 FE) MG PO TABS
1.0000 | ORAL_TABLET | Freq: Every day | ORAL | Status: DC
  Administered 2011-06-14: 106 mg via ORAL
  Administered 2011-06-15 – 2011-06-17 (×3): 324 mg via ORAL
  Administered 2011-06-18: 106 mg via ORAL
  Administered 2011-06-19: 324 mg via ORAL
  Administered 2011-06-20: 106 mg via ORAL
  Filled 2011-06-14 (×8): qty 1

## 2011-06-14 MED ORDER — ONDANSETRON HCL 4 MG PO TABS: 4.0000 mg | ORAL_TABLET | Freq: Four times a day (QID) | ORAL | Status: DC | PRN

## 2011-06-14 MED ORDER — ACETAMINOPHEN 325 MG PO TABS: 650.0000 mg | ORAL_TABLET | Freq: Four times a day (QID) | ORAL | Status: DC | PRN

## 2011-06-14 MED ORDER — SODIUM CHLORIDE 0.9 % IV SOLN
INTRAVENOUS | Status: DC
  Administered 2011-06-14 – 2011-06-16 (×3): via INTRAVENOUS

## 2011-06-14 MED ORDER — ONDANSETRON HCL 4 MG/2ML IJ SOLN: 4.0000 mg | Freq: Four times a day (QID) | INTRAMUSCULAR | Status: DC | PRN

## 2011-06-14 NOTE — ED Notes (Signed)
Attempted to call report, nurse unable at this time, was told that she would call back when able

## 2011-06-14 NOTE — ED Provider Notes (Signed)
Medical screening examination/treatment/procedure(s) were conducted as a shared visit with non-physician practitioner(s) and myself.  I personally evaluated the patient during the encounter  Recently started on steroids, hearing voices, erratic behavior.  Glynn Octave, MD 06/14/11 [phone]

## 2011-06-14 NOTE — H&P (Signed)
[name] MRN: [id number] DOB/AGE: [birth year]  Admit date: 06/14/2011 Chief Complaint: Hyponatremia, psychosis. HPI: This 77 year old lady appeared to be well until approximately one week ago when she was started on prednisone. She then started to hear voices. She presented to the emergency room because of psychiatric problems and was found to be severely hyponatremic with a sodium of 118. In fact, she has had problems with hyponatremia in the past. In August of 2012 she had seizures when her sodium also was depressed at 111. At this time it was felt that the hyponatremia was due to her thiazide diuretic for hypertension. With institution of normal saline her sodium almost normalized and she was able to be discharged home at this time. She has not had any seizures on this occasion.    Past medical history: 1. Hypertension. 2. Rheumatoid arthritis.        Family history: Noncontributory.  Social history: Both her husbands have died. She does not smoke cigarettes. She does not drink alcohol. She lives alone.  Allergies:  Allergies  Allergen Reactions  . Codeine   . Colchicine   . Famotidine   . Penicillins     Medications Prior to Admission  Medication Dose Route Frequency Provider Last Rate Last Dose  . 0.9 %  sodium chloride infusion   Intravenous Continuous Nimish C Gosrani      . acetaminophen (TYLENOL) tablet 650 mg  650 mg Oral Q6H PRN Nimish C Gosrani       Or  . acetaminophen (TYLENOL) suppository 650 mg  650 mg Rectal Q6H PRN Nimish C Gosrani      . cefTRIAXone (ROCEPHIN) 1 g in dextrose 5 % 50 mL IVPB  1 g Intravenous Once Glynn Octave, MD   1 g at 06/14/11 1544  . cholecalciferol (VITAMIN D) tablet 1,000 Units  1,000 Units Oral Daily Nimish C Gosrani      . ciprofloxacin (CIPRO) IVPB 400 mg  400 mg Intravenous Q12H Nimish C Gosrani      . ferrous fumarate (HEMOCYTE - 106 mg FE) tablet 106 mg of iron  1 tablet Oral Daily Nimish C Gosrani      .  folic acid (FOLVITE) tablet 1 mg  1 mg Oral Daily Nimish C Gosrani      . heparin injection 5,000 Units  5,000 Units Subcutaneous Q8H Nimish C Gosrani      . metoprolol (LOPRESSOR) tablet 25 mg  25 mg Oral BID Nimish C Gosrani      . ondansetron (ZOFRAN) tablet 4 mg  4 mg Oral Q6H PRN Nimish C Gosrani       Or  . ondansetron (ZOFRAN) injection 4 mg  4 mg Intravenous Q6H PRN Nimish C Gosrani      . sodium chloride 0.9 % bolus 1,000 mL  1,000 mL Intravenous Once Kathie Dike, PA   1,000 mL at 06/14/11 1219  . vitamin C (ASCORBIC ACID) tablet 500 mg  500 mg Oral Daily Nimish C Gosrani       No current outpatient prescriptions on file as of 06/14/2011.       WUJ:WJXBJ from the symptoms mentioned above,there are no other symptoms referable to all systems reviewed.  Physical Exam: Blood pressure 154/94, pulse 93, temperature 98.3 F (36.8 C), temperature source Oral, resp. rate 18, height 5\' 3"  (1.6 m), weight 56.7 kg (125 lb), SpO2 100.00%. She looks systemically well and is not delirious. She appears to be alert and orientated in time  place and person. She currently does not appear to be hearing voices when I was examining her. Heart sounds are present and normal. Lung fields are clinically clear. Abdomen is soft and nontender with no evidence of hepatosplenomegaly. There is no lymphadenopathy that is abnormal. Neurologically, she is at intact with no focal neurological signs.    Basename 06/14/11 1121  WBC 3.8*  NEUTROABS 2.3  HGB 10.9*  HCT 31.1*  MCV 82.7  PLT 296    Basename 06/14/11 1121  NA 118*  K 4.1  CL 83*  CO2 25  GLUCOSE 98  BUN 8  CREATININE 0.49*  CALCIUM 10.1  MG --         Dg Chest 2 View  06/14/2011  *RADIOLOGY REPORT*  Clinical Data: Auditory hallucinations.  CHEST - 2 VIEW  Comparison: 09/13/2010 and 08/05/2005  Findings: Heart size and vascularity are normal and the lungs are clear.  There is a compression fracture of T11, new since 09/13/2010.   No other abnormalities.  IMPRESSION: Compression fracture of T11, new since 09/13/2010.  Otherwise, normal exam.  Per CMS PQRS reporting requirements (PQRS Measure 24): Given the patient's age of greater than 75 and the fracture site (hip, distal radius, or spine), the patient should be tested for osteoporosis using DXA, and the appropriate treatment considered based on the DXA results.  Original Report Authenticated By: Gwynn Burly, M.D.   Ct Head Wo Contrast  06/14/2011  *RADIOLOGY REPORT*  Clinical Data: Psychiatric evaluation.Hallucinations.  CT HEAD WITHOUT CONTRAST  Technique:  Contiguous axial images were obtained from the base of the skull through the vertex without contrast.  Comparison: The 25 12  Findings: Mild cerebral atrophy. No acute intracranial abnormality. Specifically, no hemorrhage, hydrocephalus, mass lesion, acute infarction, or significant intracranial injury.  No acute calvarial abnormality. Visualized paranasal sinuses and mastoids clear. Orbital soft tissues unremarkable.  IMPRESSION: Atrophy. No acute intracranial abnormality.  Original Report Authenticated By: Cyndie Chime, M.D.   Impression: 1. Hyponatremia, unclear etiology. I suspect a degree of volume depletion. 2. 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 3. Rheumatoid arthritis. 4. Urinary tract infection on urinalysis. Await urine culture. 5. Hypertension. 6. Coronary artery disease, stable. 7. T11 compression fracture, apparently asymptomatic.     Plan: 1. Admit to regular medical floor. 2. Intravenous gentle hydration with normal saline. 3. Discontinue steroids. 4. Treat urinary tract infection with intravenous ciprofloxacin. Further recommendations will depend on patient's hospital progress.      Wilson Singer Pager [phone]  06/14/2011, 3:55 PM

## 2011-06-14 NOTE — ED Notes (Signed)
Patient brought by [facility] due to pt with auditory hallucinations and HI- plan to shoot anyone that comes to door per pt and per pt was told by son to do it, denies any SI, pt calm and cooperative

## 2011-06-14 NOTE — ED Notes (Signed)
Pt c/o hearing voices telling her to stay up all night and talk all night. Pt also states that the voices are telling her to shoot through the door and hurt other people. Denies suicidal ideations.

## 2011-06-14 NOTE — ED Notes (Signed)
CRITICAL VALUE ALERT  Critical value received:Na 118  Date of notification:  06/14/11  Time of notification:  1159  Critical value read back:yes  Nurse who received alert: B. Garner Nash, RN  MD notified (1st page):  H. Beverely Pace, [place]  Time of first page:  1203  MD notified (2nd page):  Time of second page:  Responding MD:  Loney Laurence, PA  Time MD responded:  [phone]

## 2011-06-14 NOTE — ED Notes (Signed)
Patient's belongings sent home with pt's son, including jewelry-one LifeAlert necklace, silver toned necklace and silver toned bracelet; pt's meds sent as well

## 2011-06-14 NOTE — ED Notes (Signed)
Assisted pt to bathroom and back to bed, pt is incont. And depends changed

## 2011-06-14 NOTE — ED Provider Notes (Signed)
History     CSN: [id number] Arrival date & time: 06/14/2011 10:56 AM   First MD Initiated Contact with Patient 06/14/11 1103      Chief Complaint  Patient presents with  . Psychiatric Evaluation    (Consider location/radiation/quality/duration/timing/severity/associated sxs/prior treatment) HPI Comments: Family reports patient has recently been staying up very late at night. Calling neighbors friends and family at 2 and 3:00 in the mornings. The patient's speech and conversation have been" scattered". The patient states that she is hearing voices that tell her to shoot to the door and hurt somebody. Invoices that keep her up a lot at night. The patient states she is seen by the physicians at [facility]. She was started on a medication recently that she is unsure of the name, but feels that this was the beginning of changes in her sleep pattern and also in her thinking. She states she is no longer taking this medication. The patient has not tried anything to improve this problem. She initiated the call and asked to be taken to the hospital.  It is also of note that the patient has had some falls recently. The patient is unsure if she hit her head during these falls are not. She does complain of increased back pain since the fall. The patient denies fever congestion or upper respiratory symptoms recently. She denies GI and GU symptoms. Denies chest pain or palpitations or shortness of breath. The patient denies suicidal ideations.   History reviewed. No pertinent past medical history.  History reviewed. No pertinent past surgical history.  History reviewed. No pertinent family history.  History  Substance Use Topics  . Smoking status: Never Smoker   . Smokeless tobacco: Not on file  . Alcohol Use: No    OB History    Grav Para Term Preterm Abortions TAB SAB Ect Mult Living                  Review of Systems  Constitutional: Negative for activity change.       All ROS Neg except  as noted in HPI  HENT: Negative for nosebleeds and neck pain.   Eyes: Negative for photophobia and discharge.  Respiratory: Negative for cough, shortness of breath and wheezing.   Cardiovascular: Negative for chest pain and palpitations.  Gastrointestinal: Negative for abdominal pain and blood in stool.  Genitourinary: Negative for dysuria, frequency and hematuria.  Musculoskeletal: Positive for back pain. Negative for arthralgias.  Skin: Negative.   Neurological: Negative for dizziness, seizures and speech difficulty.  Psychiatric/Behavioral: Positive for hallucinations, confusion and sleep disturbance. The patient is nervous/anxious.     Allergies  Codeine; Colchicine; Famotidine; and Penicillins  Home Medications   Current Outpatient Rx  Name Route Sig Dispense Refill  . VITAMIN D 1000 UNITS PO TABS Oral Take 1,000 Units by mouth daily.      Marland Kitchen FERROUS FUMARATE 325 (106 FE) MG PO TABS Oral Take 1 tablet by mouth.      . FOLIC ACID 1 MG PO TABS Oral Take 1 mg by mouth daily.      Marland Kitchen METOPROLOL TARTRATE 25 MG PO TABS Oral Take 25 mg by mouth 2 (two) times daily.      Marland Kitchen PREDNISOLONE 5 MG PO TABS Oral Take 5 mg by mouth daily.      Marland Kitchen VITAMIN C 500 MG PO TABS Oral Take 500 mg by mouth daily.        BP 154/94  Pulse 93  Temp(Src) 98.3  F (36.8 C) (Oral)  Resp 18  Ht 5\' 3"  (1.6 m)  Wt 125 lb (56.7 kg)  BMI 22.14 kg/m2  SpO2 100%  Physical Exam  Nursing note and vitals reviewed. Constitutional: She is oriented to person, place, and time. She appears well-developed and well-nourished.  Non-toxic appearance.  HENT:  Head: Normocephalic.  Right Ear: Tympanic membrane and external ear normal.  Left Ear: Tympanic membrane and external ear normal.       No bruising or pain or tenderness to any areas of the scalp.  Eyes: EOM and lids are normal. Pupils are equal, round, and reactive to light.  Neck: Normal range of motion. Neck supple. Carotid bruit is not present.  Cardiovascular:  Normal rate, regular rhythm, intact distal pulses and normal pulses.   Murmur heard. Pulmonary/Chest: Breath sounds normal. No respiratory distress.  Abdominal: Soft. Bowel sounds are normal. There is no tenderness. There is no guarding.  Musculoskeletal: Normal range of motion.       Mid and lower back pain to palpation. No bruising noted.  Lymphadenopathy:       Head (right side): No submandibular adenopathy present.       Head (left side): No submandibular adenopathy present.    She has no cervical adenopathy.  Neurological: She is alert and oriented to person, place, and time. She has normal strength. No cranial nerve deficit or sensory deficit.  Skin: Skin is warm and dry.  Psychiatric: Her speech is normal.       Patient has disorganized thought process during conversation with the examiner. She did not experience voices talking to her during my examination. She admits to being moderately anxious. She recognizes her family member in the row. But according to family member easily has fax next up concerning events. She denies suicidal ideation. But talks about voices telling her to shoot through a door to hurt someone.    ED Course  Procedures (including critical care time)  Labs Reviewed  CBC - Abnormal; Notable for the following:    WBC 3.8 (*)    RBC 3.76 (*)    Hemoglobin 10.9 (*)    HCT 31.1 (*)    All other components within normal limits  COMPREHENSIVE METABOLIC PANEL - Abnormal; Notable for the following:    Sodium 118 (*)    Chloride 83 (*)    Creatinine, Ser 0.49 (*)    All other components within normal limits  AMMONIA - Abnormal; Notable for the following:    Ammonia <10 (*)    All other components within normal limits  URINALYSIS, ROUTINE W REFLEX MICROSCOPIC - Abnormal; Notable for the following:    APPearance HAZY (*)    Hgb urine dipstick TRACE (*)    Ketones, ur 15 (*)    Nitrite POSITIVE (*)    Leukocytes, UA LARGE (*)    All other components within  normal limits  URINE MICROSCOPIC-ADD ON - Abnormal; Notable for the following:    Bacteria, UA MANY (*)    All other components within normal limits  DIFFERENTIAL  URINE RAPID DRUG SCREEN (HOSP PERFORMED)  ETHANOL   Dg Chest 2 View  06/14/2011  *RADIOLOGY REPORT*  Clinical Data: Auditory hallucinations.  CHEST - 2 VIEW  Comparison: 09/13/2010 and 08/05/2005  Findings: Heart size and vascularity are normal and the lungs are clear.  There is a compression fracture of T11, new since 09/13/2010.  No other abnormalities.  IMPRESSION: Compression fracture of T11, new since 09/13/2010.  Otherwise, normal  exam.  Per CMS PQRS reporting requirements (PQRS Measure 24): Given the patient's age of greater than 50 and the fracture site (hip, distal radius, or spine), the patient should be tested for osteoporosis using DXA, and the appropriate treatment considered based on the DXA results.  Original Report Authenticated By: Gwynn Burly, M.D.   Ct Head Wo Contrast  06/14/2011  *RADIOLOGY REPORT*  Clinical Data: Psychiatric evaluation.Hallucinations.  CT HEAD WITHOUT CONTRAST  Technique:  Contiguous axial images were obtained from the base of the skull through the vertex without contrast.  Comparison: The 25 12  Findings: Mild cerebral atrophy. No acute intracranial abnormality. Specifically, no hemorrhage, hydrocephalus, mass lesion, acute infarction, or significant intracranial injury.  No acute calvarial abnormality. Visualized paranasal sinuses and mastoids clear. Orbital soft tissues unremarkable.  IMPRESSION: Atrophy. No acute intracranial abnormality.  Original Report Authenticated By: Cyndie Chime, M.D.     Dx: 1. Hyponatremia #2 schizophrenia   MDM  I have reviewed nursing notes, vital signs, and all appropriate lab and imaging results for this patient. The patient has disorganized thought process and speech. She complains of hearing voices telling her to shoot through someone's door. She is  confused easily about facts of various details. Her case has been discussed with the psychological team. The patient cannot be moved to another facility due to medical findings. The patient has a hyponatremia, that we'll need to be treated before psychological placing. The case has been discussed with the hospitalist. And they will admit the patient for medical admission.       Kathie Dike, [place] 06/14/11 [phone]

## 2011-06-15 LAB — IRON AND TIBC
Iron: 58 ug/dL (ref 42–135)
TIBC: 286 ug/dL (ref 250–470)
UIBC: 228 ug/dL (ref 125–400)

## 2011-06-15 LAB — CBC
Hemoglobin: 10.5 g/dL — ABNORMAL LOW (ref 12.0–15.0)
MCH: 29.6 pg (ref 26.0–34.0)
MCV: 84.2 fL (ref 78.0–100.0)
RBC: 3.55 MIL/uL — ABNORMAL LOW (ref 3.87–5.11)

## 2011-06-15 LAB — COMPREHENSIVE METABOLIC PANEL
AST: 20 U/L (ref 0–37)
Albumin: 2.8 g/dL — ABNORMAL LOW (ref 3.5–5.2)
Calcium: 9.1 mg/dL (ref 8.4–10.5)
Chloride: 93 mEq/L — ABNORMAL LOW (ref 96–112)
Creatinine, Ser: 0.43 mg/dL — ABNORMAL LOW (ref 0.50–1.10)

## 2011-06-15 LAB — FERRITIN: Ferritin: 140 ng/mL (ref 10–291)

## 2011-06-15 MED ORDER — SENNOSIDES-DOCUSATE SODIUM 8.6-50 MG PO TABS
1.0000 | ORAL_TABLET | Freq: Every day | ORAL | Status: DC
  Administered 2011-06-15 – 2011-06-19 (×5): 1 via ORAL
  Filled 2011-06-15 (×5): qty 1

## 2011-06-15 MED ORDER — CIPROFLOXACIN IN D5W 400 MG/200ML IV SOLN
INTRAVENOUS | Status: AC
  Filled 2011-06-15: qty 200

## 2011-06-15 MED ORDER — POLYETHYLENE GLYCOL 3350 17 G PO PACK
17.0000 g | PACK | Freq: Every day | ORAL | Status: DC
  Administered 2011-06-15 – 2011-06-18 (×4): 17 g via ORAL
  Filled 2011-06-15 (×4): qty 1

## 2011-06-15 NOTE — Progress Notes (Signed)
Physical Therapy Evaluation Patient Details Name: [name] MRN: [id number] DOB: 02-14-1930 Today's Date: 06/15/2011  Problem List:  Patient Active Problem List  Diagnoses  . HYPERLIPIDEMIA  . ANXIETY  . HYPERTENSION  . CORONARY ARTERY DISEASE  . Rheumatoid arthritis  . OSTEOARTHRITIS  . FOOT PAIN, RIGHT  . OSTEOPENIA  . CONSTIPATION, HX OF  . Hyponatremia  . Psychosis  . UTI (urinary tract infection), uncomplicated    Past Medical History:  Past Medical History  Diagnosis Date  . Hypertension   . Dysrhythmia   . Arthritis   . Fall 06/01/11    twice this year  . Incontinence of urine     wears depends   Past Surgical History:  Past Surgical History  Procedure Date  . Toe surgery     great toe bilateral feet due to arthrits    PT Assessment/Plan/Recommendation PT Assessment Clinical Impression Statement: pt appears to be at prior functional level...ambulates with cane for functional distances, is independent with transfers and balance is WNL...Marland Kitchenwould recommend HHPT at d/c for home safety eval PT Recommendation/Assessment: All further PT needs can be met in the next venue of care PT Problem List: Other (comment) Problem List Comments: pt describes her home as being "a big old house"..safety at home is an unknown PT Recommendation Follow Up Recommendations: Home health PT Equipment Recommended: None recommended by PT PT Goals     PT Evaluation Precautions/Restrictions  Precautions Required Braces or Orthoses: No Restrictions Weight Bearing Restrictions: No Prior Functioning  Home Living Lives With: Alone Receives Help From: Family Type of Home: House Home Layout: One level Home Access: Level entry Home Adaptive Equipment: Straight cane Prior Function Level of Independence: Independent with basic ADLs;Independent with gait;Independent with homemaking with ambulation;Independent with transfers;Requires assistive device for independence Driving:  No Vocation: Retired Producer, television/film/video: Awake/alert Overall Cognitive Status: Appears within functional limits for tasks assessed Orientation Level: Oriented X4 Sensation/Coordination Sensation Light Touch: Appears Intact Stereognosis: Not tested Hot/Cold: Not tested Proprioception: Appears Intact Coordination Gross Motor Movements are Fluid and Coordinated: Yes Fine Motor Movements are Fluid and Coordinated: Yes Extremity Assessment RUE Assessment RUE Assessment: Within Functional Limits (severe hand joint deformity) LUE Assessment LUE Assessment: Within Functional Limits RLE Assessment RLE Assessment: Within Functional Limits LLE Assessment LLE Assessment: Within Functional Limits Mobility (including Balance) Bed Mobility Bed Mobility: Yes Supine to Sit: 7: Independent Sit to Supine - Right: 7: Independent Transfers Transfers: Yes Sit to Stand: 7: Independent Stand to Sit: 7: Independent Ambulation/Gait Ambulation/Gait: Yes Ambulation/Gait Assistance: 6: Modified independent (Device/Increase time) Ambulation Distance (Feet): 150 Feet Assistive device: Straight cane Gait Pattern: Within Functional Limits  Posture/Postural Control Posture/Postural Control: No significant limitations Balance Balance Assessed:  (WNL) Exercise    End of Session PT - End of Session Equipment Utilized During Treatment: Gait belt Activity Tolerance: Patient tolerated treatment well Patient left: in bed;with call bell in reach;with bed alarm set;with family/visitor present General Behavior During Session: [facility] for tasks performed Cognition: [facility] for tasks performed  Konrad Penta 06/15/2011, 1:25 PM

## 2011-06-15 NOTE — Progress Notes (Signed)
Asked patient this AM if she had any more auditory hallucinations. Last PM she reported at home she only had them during her dreams. This AM patient states "no they must have flushed all the medicine out that gave me those dreams." Will continue to monitor patient.

## 2011-06-15 NOTE — Progress Notes (Signed)
Subjective: Patient awake and alert, denies any chest pain or shortness of breath, complained of constipation for more than 5 days but no abdominal pain, no nausea or vomiting.`  Objective: Vital signs in last 24 hours: Filed Vitals:   06/14/11 1628 06/14/11 1807 06/14/11 2122 06/15/11 0555  BP: 139/72 147/71 106/62 155/83  Pulse: 66 65 63 55  Temp:  97.9 F (36.6 C) 97.4 F (36.3 C) 97.8 F (36.6 C)  TempSrc:  Oral Oral Oral  Resp: 16 20 20 20   Height:  5\' 3"  (1.6 m)    Weight:  56.7 kg (125 lb)    SpO2: 100% 100% 100% 100%   Weight change:   Intake/Output Summary (Last 24 hours) at 06/15/11 0949 Last data filed at 06/15/11 0900  Gross per 24 hour  Intake 1253.33 ml  Output      0 ml  Net 1253.33 ml   Lying on bed not on respiratory distress or shortness of breath, she is fully oriented to place time and person Neck with no lymphadenopathy and no JVP and no masses Heart S1 and S2 with I. this sounds Lung normal vesicular breathing with equal air entry Abdomen soft, nontender bowel sounds present Extremities without edema, peripheral pulses intact   Lab Results:  Basename 06/15/11 0453 06/14/11 1121  NA 125* 118*  K 3.8 4.1  CL 93* 83*  CO2 25 25  GLUCOSE 85 98  BUN 6 8  CREATININE 0.43* 0.49*  CALCIUM 9.1 10.1  MG -- --  PHOS -- --    Basename 06/15/11 0453 06/14/11 1121  AST 20 24  ALT 13 17  ALKPHOS 50 59  BILITOT 0.2* 0.4  PROT 6.5 7.7  ALBUMIN 2.8* 3.6   No results found for this basename: LIPASE:2,AMYLASE:2 in the last 72 hours  Basename 06/15/11 0453 06/14/11 1121  WBC 3.9* 3.8*  NEUTROABS -- 2.3  HGB 10.5* 10.9*  HCT 29.9* 31.1*  MCV 84.2 82.7  PLT 254 296   No results found for this basename: CKTOTAL:3,CKMB:3,CKMBINDEX:3,TROPONINI:3 in the last 72 hours No components found with this basename: POCBNP:3 No results found for this basename: DDIMER:2 in the last 72 hours No results found for this basename: HGBA1C:2 in the last 72 hours No  results found for this basename: CHOL:2,HDL:2,LDLCALC:2,TRIG:2,CHOLHDL:2,LDLDIRECT:2 in the last 72 hours No results found for this basename: TSH,T4TOTAL,FREET3,T3FREE,THYROIDAB in the last 72 hours No results found for this basename: VITAMINB12:2,FOLATE:2,FERRITIN:2,TIBC:2,IRON:2,RETICCTPCT:2 in the last 72 hours  Micro Results: No results found for this or any previous visit (from the past 240 hour(s)).  Studies/Results: Dg Chest 2 View  06/14/2011  *RADIOLOGY REPORT*  Clinical Data: Auditory hallucinations.  CHEST - 2 VIEW  Comparison: 09/13/2010 and 08/05/2005  Findings: Heart size and vascularity are normal and the lungs are clear.  There is a compression fracture of T11, new since 09/13/2010.  No other abnormalities.  IMPRESSION: Compression fracture of T11, new since 09/13/2010.  Otherwise, normal exam.  Per CMS PQRS reporting requirements (PQRS Measure 24): Given the patient's age of greater than 50 and the fracture site (hip, distal radius, or spine), the patient should be tested for osteoporosis using DXA, and the appropriate treatment considered based on the DXA results.  Original Report Authenticated By: Gwynn Burly, M.D.   Ct Head Wo Contrast  06/14/2011  *RADIOLOGY REPORT*  Clinical Data: Psychiatric evaluation.Hallucinations.  CT HEAD WITHOUT CONTRAST  Technique:  Contiguous axial images were obtained from the base of the skull through the vertex without contrast.  Comparison: The 25 12  Findings: Mild cerebral atrophy. No acute intracranial abnormality. Specifically, no hemorrhage, hydrocephalus, mass lesion, acute infarction, or significant intracranial injury.  No acute calvarial abnormality. Visualized paranasal sinuses and mastoids clear. Orbital soft tissues unremarkable.  IMPRESSION: Atrophy. No acute intracranial abnormality.  Original Report Authenticated By: Cyndie Chime, M.D.    Medications: I have reviewed the patient's current medications. Scheduled Meds:   .  cefTRIAXone (ROCEPHIN)  IV  1 g Intravenous Once  . cholecalciferol  1,000 Units Oral Daily  . ciprofloxacin  400 mg Intravenous Q12H  . ferrous fumarate  1 tablet Oral Daily  . folic acid  1 mg Oral Daily  . heparin  5,000 Units Subcutaneous Q8H  . metoprolol tartrate  25 mg Oral BID  . polyethylene glycol  17 g Oral Daily  . senna-docusate  1 tablet Oral QHS  . sodium chloride  1,000 mL Intravenous Once  . vitamin C  500 mg Oral Daily   Continuous Infusions:   . sodium chloride 50 mL/hr at 06/15/11 0642   PRN Meds:.acetaminophen, acetaminophen, ondansetron (ZOFRAN) IV, ondansetron  Assessment/Plan: 1-hyponatremia improving likely secondary to thiazide diuretics. We'll continue with IV fluid 2-psychosis likely secondary to steroids and hyponatremia and urinary tract infection, currently patient denies any hallucination. 3-UTI agree with Cipro IV 5-anemia he would get the anemia panel and stool guaiac 6-compression fracture of T11, currently patient is asymptomatic,PT evalve patient would like to be dc home.,[name] I. 06/15/2011, 9:49 AM

## 2011-06-15 NOTE — Progress Notes (Signed)
UR Chart Review Completed  

## 2011-06-16 LAB — BASIC METABOLIC PANEL
CO2: 24 mEq/L (ref 19–32)
Calcium: 8.7 mg/dL (ref 8.4–10.5)
Calcium: 8.7 mg/dL (ref 8.4–10.5)
Chloride: 91 mEq/L — ABNORMAL LOW (ref 96–112)
Creatinine, Ser: 0.55 mg/dL (ref 0.50–1.10)
GFR calc Af Amer: >90 mL/min (ref >90)
GFR calc Af Amer: >90 mL/min (ref >90)
GFR calc non Af Amer: 81 mL/min — ABNORMAL LOW (ref >90)
GFR calc non Af Amer: 87 mL/min — ABNORMAL LOW (ref >90)
Sodium: 122 mEq/L — ABNORMAL LOW (ref 135–145)

## 2011-06-16 LAB — COMPREHENSIVE METABOLIC PANEL
ALT: 11 U/L (ref 0–35)
CO2: 25 mEq/L (ref 19–32)
Calcium: 8.7 mg/dL (ref 8.4–10.5)
Creatinine, Ser: 0.48 mg/dL — ABNORMAL LOW (ref 0.50–1.10)
GFR calc Af Amer: >90 mL/min (ref >90)
GFR calc non Af Amer: >90 mL/min (ref >90)
Glucose, Bld: 85 mg/dL (ref 70–99)
Sodium: 123 mEq/L — ABNORMAL LOW (ref 135–145)

## 2011-06-16 LAB — SODIUM, URINE, RANDOM: Sodium, Ur: 106 mEq/L

## 2011-06-16 LAB — CBC
Hemoglobin: 9.5 g/dL — ABNORMAL LOW (ref 12.0–15.0)
MCHC: 34.4 g/dL (ref 30.0–36.0)

## 2011-06-16 MED ORDER — SODIUM CHLORIDE 0.9 % IV SOLN: INTRAVENOUS | Status: DC
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
## 2011-06-16 NOTE — Plan of Care (Signed)
Problem: Phase II Progression Outcomes Goal: IV changed to normal saline lock

## 2011-06-17 LAB — BASIC METABOLIC PANEL
BUN: 7 mg/dL (ref 6–23)
BUN: 6 mg/dL (ref 6–23)
CO2: 27 mEq/L (ref 19–32)
CO2: 23 mEq/L (ref 19–32)
Calcium: 9 mg/dL (ref 8.4–10.5)
Calcium: 9.2 mg/dL (ref 8.4–10.5)
Calcium: 8.6 mg/dL (ref 8.4–10.5)
Chloride: 91 mEq/L — ABNORMAL LOW (ref 96–112)
Chloride: 91 mEq/L — ABNORMAL LOW (ref 96–112)
Chloride: 89 mEq/L — ABNORMAL LOW (ref 96–112)
Creatinine, Ser: 0.41 mg/dL — ABNORMAL LOW (ref 0.50–1.10)
Creatinine, Ser: 0.46 mg/dL — ABNORMAL LOW (ref 0.50–1.10)
GFR calc Af Amer: >90 mL/min (ref >90)
GFR calc Af Amer: >90 mL/min (ref >90)
GFR calc Af Amer: >90 mL/min (ref >90)
GFR calc non Af Amer: >90 mL/min (ref >90)
Glucose, Bld: 134 mg/dL — ABNORMAL HIGH (ref 70–99)
Glucose, Bld: 124 mg/dL — ABNORMAL HIGH (ref 70–99)
Sodium: 122 mEq/L — ABNORMAL LOW (ref 135–145)
Sodium: 124 mEq/L — ABNORMAL LOW (ref 135–145)

## 2011-06-17 LAB — BASIC METABOLIC PANEL WITH GFR
BUN: 7 mg/dL (ref 6–23)
CO2: 26 meq/L (ref 19–32)
Calcium: 8.9 mg/dL (ref 8.4–10.5)
Chloride: 91 meq/L — ABNORMAL LOW (ref 96–112)
Creatinine, Ser: 0.48 mg/dL — ABNORMAL LOW (ref 0.50–1.10)
GFR calc Af Amer: >90 mL/min
GFR calc non Af Amer: >90 mL/min
Glucose, Bld: 97 mg/dL (ref 70–99)
Potassium: 3.8 meq/L (ref 3.5–5.1)
Sodium: 124 meq/L — ABNORMAL LOW (ref 135–145)

## 2011-06-17 LAB — CBC
Platelets: 232 10*3/uL (ref 150–400)
RBC: 3.52 MIL/uL — ABNORMAL LOW (ref 3.87–5.11)
RDW: 15.2 % (ref 11.5–15.5)
WBC: 2.9 10*3/uL — ABNORMAL LOW (ref 4.0–10.5)

## 2011-06-17 LAB — OSMOLALITY: Osmolality: 254 mosm/kg — ABNORMAL LOW (ref 275–300)

## 2011-06-17 LAB — OSMOLALITY, URINE: Osmolality, Ur: 434 mosm/kg (ref 390–1090)

## 2011-06-17 MED ORDER — DEMECLOCYCLINE HCL 150 MG PO TABS
150.0000 mg | ORAL_TABLET | Freq: Two times a day (BID) | ORAL | Status: DC
  Administered 2011-06-17 – 2011-06-18 (×3): 150 mg via ORAL
  Filled 2011-06-17 (×7): qty 1

## 2011-06-17 NOTE — Progress Notes (Signed)
Subjective: Denies any complaint, she is fully awake and oriented, she denies any numbness or weakness of her extremities  Objective: Vital signs in last 24 hours: Filed Vitals:   06/16/11 0541 06/16/11 1447 06/16/11 2255 06/17/11 0455  BP: 145/71 113/69 141/77 166/81  Pulse: 55 64 59 52  Temp: 98.1 F (36.7 C) 98.1 F (36.7 C) 97.9 F (36.6 C) 97.6 F (36.4 C)  TempSrc: Oral Oral Oral Oral  Resp: 18 18 18 18   Height:      Weight:      SpO2: 100% 99% 100% 100%   Weight change:   Intake/Output Summary (Last 24 hours) at 06/17/11 1404 Last data filed at 06/16/11 1700  Gross per 24 hour  Intake   1981 ml  Output    400 ml  Net   1581 ml   She is lying comfortably on bed not on respiratory distress or shortness of breath Neck supple with no lymphadenopathy and heart sound S1 and S2 with no added sounds Lungs examination normal vesicular breathing with equal air entry Abdomen soft nontender bowel sounds present Extremities without edema CNS patient awake alert oriented x3 with no focal neuro deficit   Lab Results:  [facility] 06/17/11 1141 06/17/11 0751  NA 124* 124*  K 3.8 4.3  CL 91* 91*  CO2 26 27  GLUCOSE 97 81  BUN 7 6  CREATININE 0.48* 0.47*  CALCIUM 8.9 9.2  MG -- --  PHOS -- --    Basename 06/16/11 0444 06/15/11 0453  AST 15 20  ALT 11 13  ALKPHOS 44 50  BILITOT 0.2* 0.2*  PROT 5.9* 6.5  ALBUMIN 2.5* 2.8*   No results found for this basename: LIPASE:2,AMYLASE:2 in the last 72 hours  Basename 06/17/11 0356 06/16/11 0444  WBC 2.9* 2.7*  NEUTROABS -- --  HGB 10.3* 9.5*  HCT 30.1* 27.6*  MCV 85.5 84.7  PLT 232 237    Basename 06/15/11 0453  TSH 2.561  T4TOTAL --  T3FREE --  THYROIDAB --    Basename 06/15/11 0950  VITAMINB12 503  FOLATE >20.0  FERRITIN 140  TIBC 286  IRON 58  RETICCTPCT 0.7    Micro Results: No results found for this or any previous visit (from the past 240 hour(s)).  Studies/Results: Dg Chest 2 View  06/14/2011   *RADIOLOGY REPORT*  Clinical Data: Auditory hallucinations.  CHEST - 2 VIEW  Comparison: 09/13/2010 and 08/05/2005  Findings: Heart size and vascularity are normal and the lungs are clear.  There is a compression fracture of T11, new since 09/13/2010.  No other abnormalities.  IMPRESSION: Compression fracture of T11, new since 09/13/2010.  Otherwise, normal exam.  Per CMS PQRS reporting requirements (PQRS Measure 24): Given the patient's age of greater than 50 and the fracture site (hip, distal radius, or spine), the patient should be tested for osteoporosis using DXA, and the appropriate treatment considered based on the DXA results.  Original Report Authenticated By: Gwynn Burly, M.D.   Ct Head Wo Contrast  06/14/2011  *RADIOLOGY REPORT*  Clinical Data: Psychiatric evaluation.Hallucinations.  CT HEAD WITHOUT CONTRAST  Technique:  Contiguous axial images were obtained from the base of the skull through the vertex without contrast.  Comparison: The 25 12  Findings: Mild cerebral atrophy. No acute intracranial abnormality. Specifically, no hemorrhage, hydrocephalus, mass lesion, acute infarction, or significant intracranial injury.  No acute calvarial abnormality. Visualized paranasal sinuses and mastoids clear. Orbital soft tissues unremarkable.  IMPRESSION: Atrophy. No acute intracranial abnormality.  Original Report Authenticated By: Cyndie Chime, M.D.    Medications: I have reviewed the patient's current medications. Scheduled Meds:   . cholecalciferol  1,000 Units Oral Daily  . ciprofloxacin  400 mg Intravenous Q12H  . ferrous fumarate  1 tablet Oral Daily  . folic acid  1 mg Oral Daily  . heparin  5,000 Units Subcutaneous Q8H  . metoprolol tartrate  25 mg Oral BID  . polyethylene glycol  17 g Oral Daily  . senna-docusate  1 tablet Oral QHS  . vitamin C  500 mg Oral Daily   Continuous Infusions:   . DISCONTD: sodium chloride 75 mL/hr at 06/16/11 1412  . DISCONTD: sodium chloride 50  mL/hr at 06/16/11 1653   PRN Meds:.acetaminophen, acetaminophen, ondansetron (ZOFRAN) IV, ondansetron  Assessment/Plan: 1-#1 hyponatremia which is recurrent previously felt secondary to thiazide diuretics, currently patient has low serum osmolality and within normal urine osmolarity I felt this is likely secondary to syndrome of inappropriate ADH, we would proceed with fluid restriction to 800 cc,, and to start the democycline 150 mg by mouth twice a day. He is taking BMET every 4 hour 2-leukopenia and anemia there could be a possibility of myelofibrosis cut, currently there is no evidence of an infection and there is no evidence of bleeding hemoglobin remained stable and as this would be farther followed as outpatient 3-showed of paranoid and hallucination felt it could be related to the hyponatremia and a steroid. Psych consulted to clear the patient as the patient admitted she was hallucinating  shooting somebody, CT head was negative, could be related to steroid, hyponatremia and UTI. 4-UTI on Cipro to complete 5 days of antibiotic. 6-hypertension on beta blocker  LOS: 3 days  Ami Thornsberry I. 06/17/2011, 2:04 PM

## 2011-06-18 LAB — BASIC METABOLIC PANEL
BUN: 4 mg/dL — ABNORMAL LOW (ref 6–23)
BUN: 5 mg/dL — ABNORMAL LOW (ref 6–23)
BUN: 7 mg/dL (ref 6–23)
BUN: 8 mg/dL (ref 6–23)
CO2: 24 mEq/L (ref 19–32)
CO2: 25 mEq/L (ref 19–32)
Calcium: 8.7 mg/dL (ref 8.4–10.5)
Calcium: 8.8 mg/dL (ref 8.4–10.5)
Calcium: 9.3 mg/dL (ref 8.4–10.5)
Chloride: 90 mEq/L — ABNORMAL LOW (ref 96–112)
Creatinine, Ser: 0.42 mg/dL — ABNORMAL LOW (ref 0.50–1.10)
Creatinine, Ser: 0.48 mg/dL — ABNORMAL LOW (ref 0.50–1.10)
Creatinine, Ser: 0.51 mg/dL (ref 0.50–1.10)
Creatinine, Ser: 0.58 mg/dL (ref 0.50–1.10)
GFR calc Af Amer: >90 mL/min (ref >90)
GFR calc Af Amer: >90 mL/min (ref >90)
GFR calc Af Amer: >90 mL/min (ref >90)
GFR calc Af Amer: >90 mL/min (ref >90)
GFR calc non Af Amer: 89 mL/min — ABNORMAL LOW (ref >90)
GFR calc non Af Amer: >90 mL/min (ref >90)
GFR calc non Af Amer: >90 mL/min (ref >90)
GFR calc non Af Amer: >90 mL/min (ref >90)
Glucose, Bld: 109 mg/dL — ABNORMAL HIGH (ref 70–99)
Glucose, Bld: 88 mg/dL (ref 70–99)
Glucose, Bld: 92 mg/dL (ref 70–99)
Potassium: 3.8 mEq/L (ref 3.5–5.1)
Potassium: 3.8 mEq/L (ref 3.5–5.1)
Sodium: 120 mEq/L — ABNORMAL LOW (ref 135–145)
Sodium: 124 mEq/L — ABNORMAL LOW (ref 135–145)

## 2011-06-18 LAB — CBC
MCH: 29.7 pg (ref 26.0–34.0)
MCHC: 35 g/dL (ref 30.0–36.0)
RDW: 15.2 % (ref 11.5–15.5)

## 2011-06-18 LAB — URIC ACID: Uric Acid, Serum: 2 mg/dL — ABNORMAL LOW (ref 2.4–7.0)

## 2011-06-18 MED ORDER — DEMECLOCYCLINE HCL 150 MG PO TABS
300.0000 mg | ORAL_TABLET | Freq: Three times a day (TID) | ORAL | Status: DC
  Administered 2011-06-18 – 2011-06-20 (×6): 300 mg via ORAL
  Filled 2011-06-18 (×14): qty 2

## 2011-06-18 MED ORDER — FUROSEMIDE 10 MG/ML IJ SOLN
20.0000 mg | Freq: Two times a day (BID) | INTRAMUSCULAR | Status: AC
  Administered 2011-06-18 (×2): 20 mg via INTRAVENOUS
  Filled 2011-06-18 (×2): qty 2

## 2011-06-18 MED ORDER — DEMECLOCYCLINE HCL 150 MG PO TABS
300.0000 mg | ORAL_TABLET | Freq: Two times a day (BID) | ORAL | Status: DC
  Filled 2011-06-18 (×4): qty 2

## 2011-06-18 MED ORDER — POTASSIUM CHLORIDE CRYS ER 20 MEQ PO TBCR
20.0000 meq | EXTENDED_RELEASE_TABLET | Freq: Two times a day (BID) | ORAL | Status: DC
  Administered 2011-06-18 – 2011-06-20 (×5): 20 meq via ORAL
  Filled 2011-06-18 (×5): qty 1

## 2011-06-18 MED ORDER — SODIUM CHLORIDE 1 G PO TABS
1.0000 g | ORAL_TABLET | Freq: Two times a day (BID) | ORAL | Status: DC
  Administered 2011-06-18 – 2011-06-20 (×4): 1 g via ORAL
  Filled 2011-06-18 (×9): qty 1

## 2011-06-18 NOTE — Progress Notes (Signed)
Subjective: Patient was no complain, she is completely asymptomatic, her sodium continued to fluctuate between 120 to 124  Objective: Vital signs in last 24 hours: Filed Vitals:   06/17/11 0455 06/17/11 1400 06/17/11 2300 06/18/11 0609  BP: 166/81 130/75 149/76 125/77  Pulse: 52 58 54 58  Temp: 97.6 F (36.4 C) 98.2 F (36.8 C) 97.4 F (36.3 C) 97.9 F (36.6 C)  TempSrc: Oral  Oral Oral  Resp: 18 16 18 20   Height:      Weight:      SpO2: 100% 99% 100% 97%   Weight change:   Intake/Output Summary (Last 24 hours) at 06/18/11 1232 Last data filed at 06/18/11 0900  Gross per 24 hour  Intake    560 ml  Output    800 ml  Net   -240 ml    She is awake and fully oriented, not on respiratory distress Neck supple with no lymph nodes Heart S1 and S2 with no added sounds Abdomen soft nontender bowel sounds present Extremities without edema CNS patient awake and fully oriented with no neurological deficit  Lab Results:  [facility] 06/18/11 0434 06/17/11 2336  NA 124* 122*  K 4.2 3.8  CL 93* 92*  CO2 26 24  GLUCOSE 88 92  BUN 4* 5*  CREATININE 0.51 0.42*  CALCIUM 9.0 8.7  MG -- --  PHOS -- --    Basename 06/16/11 0444  AST 15  ALT 11  ALKPHOS 44  BILITOT 0.2*  PROT 5.9*  ALBUMIN 2.5*   No results found for this basename: LIPASE:2,AMYLASE:2 in the last 72 hours  Basename 06/18/11 0434 06/17/11 0356  WBC 4.0 2.9*  NEUTROABS -- --  HGB 10.7* 10.3*  HCT 30.6* 30.1*  MCV 85.0 85.5  PLT 253 232   Micro Results: No results found for this or any previous visit (from the past 240 hour(s)).  Studies/Results: Dg Chest 2 View  06/14/2011  *RADIOLOGY REPORT*  Clinical Data: Auditory hallucinations.  CHEST - 2 VIEW  Comparison: 09/13/2010 and 08/05/2005  Findings: Heart size and vascularity are normal and the lungs are clear.  There is a compression fracture of T11, new since 09/13/2010.  No other abnormalities.  IMPRESSION: Compression fracture of T11, new since  09/13/2010.  Otherwise, normal exam.  Per CMS PQRS reporting requirements (PQRS Measure 24): Given the patient's age of greater than 50 and the fracture site (hip, distal radius, or spine), the patient should be tested for osteoporosis using DXA, and the appropriate treatment considered based on the DXA results.  Original Report Authenticated By: Gwynn Burly, M.D.   Ct Head Wo Contrast  06/14/2011  *RADIOLOGY REPORT*  Clinical Data: Psychiatric evaluation.Hallucinations.  CT HEAD WITHOUT CONTRAST  Technique:  Contiguous axial images were obtained from the base of the skull through the vertex without contrast.  Comparison: The 25 12  Findings: Mild cerebral atrophy. No acute intracranial abnormality. Specifically, no hemorrhage, hydrocephalus, mass lesion, acute infarction, or significant intracranial injury.  No acute calvarial abnormality. Visualized paranasal sinuses and mastoids clear. Orbital soft tissues unremarkable.  IMPRESSION: Atrophy. No acute intracranial abnormality.  Original Report Authenticated By: Cyndie Chime, M.D.    Medications:  I have reviewed the patient's current medications. Anti-infectives     Start     Dose/Rate Route Frequency Ordered Stop   06/18/11 2200   demeclocycline (DECLOMYCIN) tablet 300 mg        300 mg Oral Every 12 hours 06/18/11 1503  06/17/11 1430   demeclocycline (DECLOMYCIN) tablet 150 mg  Status:  Discontinued        150 mg Oral Every 12 hours 06/17/11 1415 06/18/11 1504   06/14/11 1600   ciprofloxacin (CIPRO) IVPB 400 mg  Status:  Discontinued        400 mg 200 mL/hr over 60 Minutes Intravenous Every 12 hours 06/14/11 1554 06/18/11 1508   06/14/11 1545   cefTRIAXone (ROCEPHIN) 1 g in dextrose 5 % 50 mL IVPB        1 g 100 mL/hr over 30 Minutes Intravenous  Once 06/14/11 1535 06/14/11 1614          Scheduled Meds:   . cholecalciferol  1,000 Units Oral Daily  . ciprofloxacin  400 mg Intravenous Q12H  . demeclocycline  150 mg Oral  Q12H  . ferrous fumarate  1 tablet Oral Daily  . folic acid  1 mg Oral Daily  . heparin  5,000 Units Subcutaneous Q8H  . metoprolol tartrate  25 mg Oral BID  . polyethylene glycol  17 g Oral Daily  . senna-docusate  1 tablet Oral QHS  . vitamin C  500 mg Oral Daily   Continuous Infusions:  PRN Meds:.acetaminophen, acetaminophen, ondansetron (ZOFRAN) IV, ondansetron  Assessment/Plan: 1- hyponatremia: asymptomatic , my impression is SIADH secretion drop more to 120 , patient is asymptomatic, will add lasix 20 mg iv bid, will ask nephrology to help, increase demecycline 300 mg po tid, please continue fluid restriction. Sodium tabs added bid,restrict free water, will add cortisol and TSH level to am lab. 2- anemia and leukopenia : improving and stable. 3- UTI completed treatment will dc cipro completed treatment  LOS: 4 days    [name] I. 06/18/2011, 12:32 PM

## 2011-06-18 NOTE — Consult Note (Signed)
Reason for Consult: Hyponatremia Referring Physician: Dr.Hind  [name] is an 77 y.o. female.  HPI: She is a patient wheeze her history of rheumatoid arthritis, coronary disease, hypertension and presently was brought to the hospital because of her increase confusion and some psychosis. According to the patient should a put on a medication possibly for urinary tract infection and after that she claims she started having problem with concentration and became weak, and  Confused.. Patient states that her son told her she was confused and he was brought her to the emergency room were shows admitted for hyponatremia and urinary tract infection. Patient also was having some nausea but no vomiting. Presently she is feeling better. She denies any pain and denies any difficulty breathing.  Past Medical History  Diagnosis Date  . Hypertension   . Dysrhythmia   . Arthritis   . Fall 06/01/11    twice this year  . Incontinence of urine     wears depends    Past Surgical History  Procedure Date  . Toe surgery     great toe bilateral feet due to arthrits    History reviewed. No pertinent family history.  Social History:  reports that she has never smoked. She has never used smokeless tobacco. She reports that she does not drink alcohol or use illicit drugs.  Allergies:  Allergies  Allergen Reactions  . Codeine   . Colchicine   . Famotidine   . Penicillins     Medications: I have reviewed the patient's current medications.  Results for orders placed during the hospital encounter of 06/14/11 (from the past 48 hour(s))  BASIC METABOLIC PANEL     Status: Abnormal   Collection Time   06/16/11  8:04 PM      Component Value Range Comment   Sodium 121 (*) 135 - 145 (mEq/L)    Potassium 4.4  3.5 - 5.1 (mEq/L)    Chloride 91 (*) 96 - 112 (mEq/L)    CO2 25  19 - 32 (mEq/L)    Glucose, Bld 95  70 - 99 (mg/dL)    BUN 9  6 - 23 (mg/dL)    Creatinine, Ser 4.69  0.50 - 1.10 (mg/dL)    Calcium 8.7  8.4 - 10.5 (mg/dL)    GFR calc non Af Amer 87 (*) >90 (mL/min)    GFR calc Af Amer >90  >90 (mL/min)   BASIC METABOLIC PANEL     Status: Abnormal   Collection Time   06/17/11  3:55 AM      Component Value Range Comment   Sodium 122 (*) 135 - 145 (mEq/L)    Potassium 4.3  3.5 - 5.1 (mEq/L)    Chloride 92 (*) 96 - 112 (mEq/L)    CO2 25  19 - 32 (mEq/L)    Glucose, Bld 84  70 - 99 (mg/dL)    BUN 7  6 - 23 (mg/dL)    Creatinine, Ser 6.29 (*) 0.50 - 1.10 (mg/dL)    Calcium 9.0  8.4 - 10.5 (mg/dL)    GFR calc non Af Amer >90  >90 (mL/min)    GFR calc Af Amer >90  >90 (mL/min)   CBC     Status: Abnormal   Collection Time   06/17/11  3:56 AM      Component Value Range Comment   WBC 2.9 (*) 4.0 - 10.5 (K/uL)    RBC 3.52 (*) 3.87 - 5.11 (MIL/uL)    Hemoglobin 10.3 (*)  12.0 - 15.0 (g/dL)    HCT 40.9 (*) 81.1 - 46.0 (%)    MCV 85.5  78.0 - 100.0 (fL)    MCH 29.3  26.0 - 34.0 (pg)    MCHC 34.2  30.0 - 36.0 (g/dL)    RDW 91.4  78.2 - 95.6 (%)    Platelets 232  150 - 400 (K/uL)   BASIC METABOLIC PANEL     Status: Abnormal   Collection Time   06/17/11  7:51 AM      Component Value Range Comment   Sodium 124 (*) 135 - 145 (mEq/L)    Potassium 4.3  3.5 - 5.1 (mEq/L)    Chloride 91 (*) 96 - 112 (mEq/L)    CO2 27  19 - 32 (mEq/L)    Glucose, Bld 81  70 - 99 (mg/dL)    BUN 6  6 - 23 (mg/dL)    Creatinine, Ser 2.13 (*) 0.50 - 1.10 (mg/dL)    Calcium 9.2  8.4 - 10.5 (mg/dL)    GFR calc non Af Amer >90  >90 (mL/min)    GFR calc Af Amer >90  >90 (mL/min)   BASIC METABOLIC PANEL     Status: Abnormal   Collection Time   06/17/11 11:41 AM      Component Value Range Comment   Sodium 124 (*) 135 - 145 (mEq/L)    Potassium 3.8  3.5 - 5.1 (mEq/L)    Chloride 91 (*) 96 - 112 (mEq/L)    CO2 26  19 - 32 (mEq/L)    Glucose, Bld 97  70 - 99 (mg/dL)    BUN 7  6 - 23 (mg/dL)    Creatinine, Ser 0.86 (*) 0.50 - 1.10 (mg/dL)    Calcium 8.9  8.4 - 10.5 (mg/dL)    GFR calc non Af Amer >90   >90 (mL/min)    GFR calc Af Amer >90  >90 (mL/min)   BASIC METABOLIC PANEL     Status: Abnormal   Collection Time   06/17/11  4:15 PM      Component Value Range Comment   Sodium 121 (*) 135 - 145 (mEq/L)    Potassium 3.9  3.5 - 5.1 (mEq/L)    Chloride 91 (*) 96 - 112 (mEq/L)    CO2 23  19 - 32 (mEq/L)    Glucose, Bld 134 (*) 70 - 99 (mg/dL)    BUN 7  6 - 23 (mg/dL)    Creatinine, Ser 5.78 (*) 0.50 - 1.10 (mg/dL)    Calcium 8.6  8.4 - 10.5 (mg/dL)    GFR calc non Af Amer >90  >90 (mL/min)    GFR calc Af Amer >90  >90 (mL/min)   BASIC METABOLIC PANEL     Status: Abnormal   Collection Time   06/17/11  7:26 PM      Component Value Range Comment   Sodium 120 (*) 135 - 145 (mEq/L)    Potassium 3.8  3.5 - 5.1 (mEq/L)    Chloride 89 (*) 96 - 112 (mEq/L)    CO2 23  19 - 32 (mEq/L)    Glucose, Bld 124 (*) 70 - 99 (mg/dL)    BUN 6  6 - 23 (mg/dL)    Creatinine, Ser 4.69 (*) 0.50 - 1.10 (mg/dL)    Calcium 8.6  8.4 - 10.5 (mg/dL)    GFR calc non Af Amer >90  >90 (mL/min)    GFR calc Af Amer >90  >90 (mL/min)  BASIC METABOLIC PANEL     Status: Abnormal   Collection Time   06/17/11 11:36 PM      Component Value Range Comment   Sodium 122 (*) 135 - 145 (mEq/L)    Potassium 3.8  3.5 - 5.1 (mEq/L)    Chloride 92 (*) 96 - 112 (mEq/L)    CO2 24  19 - 32 (mEq/L)    Glucose, Bld 92  70 - 99 (mg/dL)    BUN 5 (*) 6 - 23 (mg/dL)    Creatinine, Ser 9.56 (*) 0.50 - 1.10 (mg/dL)    Calcium 8.7  8.4 - 10.5 (mg/dL)    GFR calc non Af Amer >90  >90 (mL/min)    GFR calc Af Amer >90  >90 (mL/min)   BASIC METABOLIC PANEL     Status: Abnormal   Collection Time   06/18/11  4:34 AM      Component Value Range Comment   Sodium 124 (*) 135 - 145 (mEq/L)    Potassium 4.2  3.5 - 5.1 (mEq/L)    Chloride 93 (*) 96 - 112 (mEq/L)    CO2 26  19 - 32 (mEq/L)    Glucose, Bld 88  70 - 99 (mg/dL)    BUN 4 (*) 6 - 23 (mg/dL)    Creatinine, Ser 2.13  0.50 - 1.10 (mg/dL)    Calcium 9.0  8.4 - 10.5 (mg/dL)    GFR  calc non Af Amer 89 (*) >90 (mL/min)    GFR calc Af Amer >90  >90 (mL/min)   CBC     Status: Abnormal   Collection Time   06/18/11  4:34 AM      Component Value Range Comment   WBC 4.0  4.0 - 10.5 (K/uL)    RBC 3.60 (*) 3.87 - 5.11 (MIL/uL)    Hemoglobin 10.7 (*) 12.0 - 15.0 (g/dL)    HCT 08.6 (*) 57.8 - 46.0 (%)    MCV 85.0  78.0 - 100.0 (fL)    MCH 29.7  26.0 - 34.0 (pg)    MCHC 35.0  30.0 - 36.0 (g/dL)    RDW 46.9  62.9 - 52.8 (%)    Platelets 253  150 - 400 (K/uL)   BASIC METABOLIC PANEL     Status: Abnormal   Collection Time   06/18/11 12:25 PM      Component Value Range Comment   Sodium 120 (*) 135 - 145 (mEq/L)    Potassium 3.8  3.5 - 5.1 (mEq/L)    Chloride 89 (*) 96 - 112 (mEq/L)    CO2 26  19 - 32 (mEq/L)    Glucose, Bld 109 (*) 70 - 99 (mg/dL)    BUN 7  6 - 23 (mg/dL)    Creatinine, Ser 4.13 (*) 0.50 - 1.10 (mg/dL)    Calcium 8.8  8.4 - 10.5 (mg/dL)    GFR calc non Af Amer >90  >90 (mL/min)    GFR calc Af Amer >90  >90 (mL/min)     No results found.  Review of Systems  Constitutional: Negative for chills and weight loss.  Respiratory: Negative for cough and shortness of breath.   Genitourinary: Positive for dysuria and frequency.  Endo/Heme/Allergies: Positive for polydipsia.   Blood pressure 125/77, pulse 58, temperature 97.9 F (36.6 C), temperature source Oral, resp. rate 20, height 5\' 3"  (1.6 m), weight 56.7 kg (125 lb), SpO2 97.00%. Physical Exam  Constitutional: She is oriented to person, place, and time.  Eyes: Left eye exhibits no discharge.  Neck: No JVD present.  Cardiovascular: Normal rate, regular rhythm and normal heart sounds.  Exam reveals no gallop and no friction rub.   Respiratory: She has no wheezes. She has no rales.  Musculoskeletal: She exhibits no edema.  Neurological: She is alert and oriented to person, place, and time.  Skin: Skin is dry.    Assessment/Plan: Problem #1 hyponatremia: Patient serum osmolality is low hence true   hyponatremia. Patient presently normotensive, with high urine osmolality and high urine sodium. The etiology for her her hyponatremia has this moment seems to be SIADH. Since patient had nausea have also is a strong stimulant for ADH release. Presently she doesn't have any nausea. Since patient has been drinking freewater status might have exacerbated her hyponatremia. Presently her BUN is low consistent with SIADH. Problem #2 history of rheumatoid arthritis Problem #3 history of hypertension blood pressure seems to be reasonably controlled Problem #4 coronary artery disease Problem #5 history of hyperlipidemia Problem #6 history of urine tract infection presently she is a febrile. Problem #7 history of anemia. Recommendation: Patient advised not to drink free  water but can have anything contains salt. We'll put her on sodium tablets 1 g by mouth twice a day We'll increase her demeclocycline to 300 mg by mouth 3 times a day. We'll continue to follow her sodium. We'll check also a uric acid.  Alexandro Line S 06/18/2011, 4:51 PM

## 2011-06-19 LAB — BASIC METABOLIC PANEL
BUN: 10 mg/dL (ref 6–23)
CO2: 25 mEq/L (ref 19–32)
Calcium: 8.9 mg/dL (ref 8.4–10.5)
Calcium: 9.3 mg/dL (ref 8.4–10.5)
Chloride: 92 mEq/L — ABNORMAL LOW (ref 96–112)
Chloride: 91 mEq/L — ABNORMAL LOW (ref 96–112)
Chloride: 91 mEq/L — ABNORMAL LOW (ref 96–112)
Creatinine, Ser: 0.47 mg/dL — ABNORMAL LOW (ref 0.50–1.10)
GFR calc Af Amer: >90 mL/min (ref >90)
GFR calc Af Amer: >90 mL/min (ref >90)
GFR calc Af Amer: >90 mL/min (ref >90)
GFR calc Af Amer: >90 mL/min (ref >90)
GFR calc non Af Amer: 89 mL/min — ABNORMAL LOW (ref >90)
GFR calc non Af Amer: >90 mL/min (ref >90)
GFR calc non Af Amer: >90 mL/min (ref >90)
GFR calc non Af Amer: >90 mL/min (ref >90)
Glucose, Bld: 78 mg/dL (ref 70–99)
Potassium: 3.8 mEq/L (ref 3.5–5.1)
Potassium: 4 mEq/L (ref 3.5–5.1)
Potassium: 4.3 mEq/L (ref 3.5–5.1)
Potassium: 4.3 mEq/L (ref 3.5–5.1)
Sodium: 121 mEq/L — ABNORMAL LOW (ref 135–145)
Sodium: 123 mEq/L — ABNORMAL LOW (ref 135–145)
Sodium: 124 mEq/L — ABNORMAL LOW (ref 135–145)
Sodium: 124 mEq/L — ABNORMAL LOW (ref 135–145)
Sodium: 127 mEq/L — ABNORMAL LOW (ref 135–145)

## 2011-06-19 LAB — CBC
Hemoglobin: 11 g/dL — ABNORMAL LOW (ref 12.0–15.0)
Platelets: 230 10*3/uL (ref 150–400)
RBC: 3.78 MIL/uL — ABNORMAL LOW (ref 3.87–5.11)
WBC: 3 10*3/uL — ABNORMAL LOW (ref 4.0–10.5)

## 2011-06-19 LAB — CORTISOL: Cortisol, Plasma: 9.2 ug/dL

## 2011-06-19 NOTE — Progress Notes (Signed)
Patient refuses soap suds enema,has had a bowel movement .

## 2011-06-19 NOTE — Progress Notes (Signed)
Subjective: She's feeling better, she has any concern regarding fluid restriction, she denies any nausea or vomiting, her constipation resolved .  Objective: Vital signs in last 24 hours: Filed Vitals:   06/18/11 1500 06/18/11 2100 06/19/11 0527 06/19/11 1523  BP: 176/78 120/71 105/67 113/64  Pulse: 67 70 59 71  Temp: 98 F (36.7 C) 97.4 F (36.3 C) 97.2 F (36.2 C) 98.1 F (36.7 C)  TempSrc:  Oral Oral Oral  Resp: 18 18 18 16   Height:      Weight:      SpO2: 100% 100% 93% 98%   Weight change:   Intake/Output Summary (Last 24 hours) at 06/19/11 1759 Last data filed at 06/19/11 1751  Gross per 24 hour  Intake    770 ml  Output      0 ml  Net    770 ml    She is lying on bed comfortably she is alert and cooperative Lungs normal vesicular breathing with equal air entry Abdomen soft nontender bowel sounds present no masses no organomegaly Extremities without edema, and has good peripheral pulse, CNS she is fully oriented with no focal neurological deficit Lab Results:  [facility] 06/19/11 1630 06/19/11 1246  NA 127* 121*  K 4.3 3.9  CL 96 91*  CO2 25 24  GLUCOSE 110* 134*  BUN 12 12  CREATININE 0.47* 0.47*  CALCIUM 9.3 9.0  MG -- --  PHOS -- --   No results found for this basename: AST:2,ALT:2,ALKPHOS:2,BILITOT:2,PROT:2,ALBUMIN:2 in the last 72 hours No results found for this basename: LIPASE:2,AMYLASE:2 in the last 72 hours  Basename 06/19/11 0448 06/18/11 0434  WBC 3.0* 4.0  NEUTROABS -- --  HGB 11.0* 10.7*  HCT 32.5* 30.6*  MCV 86.0 85.0  PLT 230 253   No results found for this basename: CKTOTAL:3,CKMB:3,CKMBINDEX:3,TROPONINI:3 in the last 72 hours No components found with this basename: POCBNP:3 No results found for this basename: DDIMER:2 in the last 72 hours No results found for this basename: HGBA1C:2 in the last 72 hours No results found for this basename: CHOL:2,HDL:2,LDLCALC:2,TRIG:2,CHOLHDL:2,LDLDIRECT:2 in the last 72 hours No results found for  this basename: TSH,T4TOTAL,FREET3,T3FREE,THYROIDAB in the last 72 hours No results found for this basename: VITAMINB12:2,FOLATE:2,FERRITIN:2,TIBC:2,IRON:2,RETICCTPCT:2 in the last 72 hours  Micro Results: No results found for this or any previous visit (from the past 240 hour(s)).  Studies/Results: Dg Chest 2 View  06/14/2011  *RADIOLOGY REPORT*  Clinical Data: Auditory hallucinations.  CHEST - 2 VIEW  Comparison: 09/13/2010 and 08/05/2005  Findings: Heart size and vascularity are normal and the lungs are clear.  There is a compression fracture of T11, new since 09/13/2010.  No other abnormalities.  IMPRESSION: Compression fracture of T11, new since 09/13/2010.  Otherwise, normal exam.  Per CMS PQRS reporting requirements (PQRS Measure 24): Given the patient's age of greater than 50 and the fracture site (hip, distal radius, or spine), the patient should be tested for osteoporosis using DXA, and the appropriate treatment considered based on the DXA results.  Original Report Authenticated By: Gwynn Burly, M.D.   Ct Head Wo Contrast  06/14/2011  *RADIOLOGY REPORT*  Clinical Data: Psychiatric evaluation.Hallucinations.  CT HEAD WITHOUT CONTRAST  Technique:  Contiguous axial images were obtained from the base of the skull through the vertex without contrast.  Comparison: The 25 12  Findings: Mild cerebral atrophy. No acute intracranial abnormality. Specifically, no hemorrhage, hydrocephalus, mass lesion, acute infarction, or significant intracranial injury.  No acute calvarial abnormality. Visualized paranasal sinuses and mastoids clear.  Orbital soft tissues unremarkable.  IMPRESSION: Atrophy. No acute intracranial abnormality.  Original Report Authenticated By: Cyndie Chime, M.D.    Medications: I have reviewed the patient's current medications. Scheduled Meds:   . cholecalciferol  1,000 Units Oral Daily  . demeclocycline  300 mg Oral TID  . ferrous fumarate  1 tablet Oral Daily  . folic  acid  1 mg Oral Daily  . furosemide  20 mg Intravenous Q12H  . heparin  5,000 Units Subcutaneous Q8H  . metoprolol tartrate  25 mg Oral BID  . potassium chloride  20 mEq Oral BID  . senna-docusate  1 tablet Oral QHS  . sodium chloride  1 g Oral BID WC  . vitamin C  500 mg Oral Daily   Continuous Infusions:  PRN Meds:.acetaminophen, acetaminophen, ondansetron (ZOFRAN) IV, ondansetron  Assessment/Plan: 1-hyponatremia : improving secondary to SIADH, she is  on freewater restriction, on demeclocyclie and sodium chloride, her sodium currently 137, would continue current management, and it continued to improve would likely discharge with demeclocycline. If nephrology agrees, we switched bmet to twice daily 2-transient leukopenia resolved 3-anemia hemoglobin is stable 4-history of coronary artery disease is stable  LOS: 5 days  Aniesa Boback I. 06/19/2011, 5:59 PM

## 2011-06-19 NOTE — Progress Notes (Signed)
Subjective: Interval History: has no complaint of nausea or vomiting. Her main complaint seems to be constipation. She denies any abdominal pain and no difficulty breathing..  Objective: Vital signs in last 24 hours: Temp:  [97.2 F (36.2 C)-98 F (36.7 C)] 97.2 F (36.2 C) (12/23 0527) Pulse Rate:  [59-70] 59  (12/23 0527) Resp:  [18] 18  (12/23 0527) BP: (105-176)/(67-78) 105/67 mmHg (12/23 0527) SpO2:  [93 %-100 %] 93 % (12/23 0527) Weight change:   Intake/Output from previous day: 12/22 0701 - 12/23 0700 In: 840 [P.O.:840] Out: 500 [Urine:500] Intake/Output this shift:    General appearance: alert and cooperative Resp: clear to auscultation bilaterally Cardio: regular rate and rhythm, S1, S2 normal, no murmur, click, rub or gallop GI: soft, non-tender; bowel sounds normal; no masses,  no organomegaly Extremities: extremities normal, atraumatic, no cyanosis or edema  Lab Results:  [facility] 06/19/11 0448 06/18/11 0434  WBC 3.0* 4.0  HGB 11.0* 10.7*  HCT 32.5* 30.6*  PLT 230 253   BMET:  Basename 06/19/11 0448 06/19/11 0009  NA 124* 123*  K 4.0 3.8  CL 92* 91*  CO2 24 26  GLUCOSE 78 113*  BUN 10 11  CREATININE 0.48* 0.51  CALCIUM 9.1 8.9   No results found for this basename: PTH:2 in the last 72 hours Iron Studies: No results found for this basename: IRON,TIBC,TRANSFERRIN,FERRITIN in the last 72 hours  Studies/Results: No results found.  I have reviewed the patient's current medications.  Assessment/Plan: Problem #1 hyponatremia presently her her urine sodium is high source of her urine osmolality, BUN is low and uric acid is also low. 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 Presently patient is on freewater restriction, on demeclocyclie and sodium chloride. Her sodium is 124 and patient is a symptomatic.  Problem #2 rheumatoid arthritis Problem #3 history of anemia she is on the iron supplement Problem #4 history of hyperlipidemia Problem #5 history of  hypertension blood pressure seems to be reasonably controlled Problem #6 history of coronary artery disease patient is a symptomatic. Recommendation: We'll continue with free water  restriction We'll increase for sodium chloride tablet 1 g by mouth 3 times a day with meals We'll check her basic metabolic panel in the morning.  LOS: 5 days   Francheska Villeda S 06/19/2011,9:04 AM

## 2011-06-20 LAB — BASIC METABOLIC PANEL
CO2: 23 mEq/L (ref 19–32)
Chloride: 98 mEq/L (ref 96–112)
Sodium: 129 mEq/L — ABNORMAL LOW (ref 135–145)

## 2011-06-20 LAB — CBC
Platelets: 223 10*3/uL (ref 150–400)
RBC: 3.26 MIL/uL — ABNORMAL LOW (ref 3.87–5.11)
WBC: 2.7 10*3/uL — ABNORMAL LOW (ref 4.0–10.5)

## 2011-06-20 MED ORDER — DEMECLOCYCLINE HCL 300 MG PO TABS: 300.0000 mg | ORAL_TABLET | Freq: Two times a day (BID) | ORAL | Status: AC

## 2011-06-20 MED ORDER — SENNOSIDES-DOCUSATE SODIUM 8.6-50 MG PO TABS: 1.0000 | ORAL_TABLET | Freq: Every day | ORAL | Status: AC

## 2011-06-20 NOTE — Discharge Summary (Signed)
DISCHARGE SUMMARY  [name]  MR#: [id number]  DOB:[birth year]  Date of Admission: 06/14/2011 Date of Discharge: 06/20/2011  Attending Physician:Aditi Rovira I.  Patient's PCP:No primary provider on file.  Consults:Treatment Team:  Jamse Mead, MD ACT team and psychiatric Discharge Diagnoses: 1-hyponatremia felt to be secondary to syndrome of inappropriate ADH secretion 2-psychosis with hallucination felt to be secondary to UTI, steroid and hyponatremia 3-history of coronary artery disease 4-hypertension 5-rheumatoid arthritis with deformity of hands 6-UTI 7-anemia and leukopenia need to be followed as outpatient Procedure: CT head without contrast no active intracranial abnormalities Current Discharge Medication List    START taking these medications   Details  demeclocycline (DECLOMYCIN) 300 MG tablet Take 1 tablet (300 mg total) by mouth 2 (two) times daily. Qty: 60 tablet, Refills: 0    senna-docusate (SENOKOT-S) 8.6-50 MG per tablet Take 1 tablet by mouth at bedtime. Qty: 30 tablet, Refills: 0      CONTINUE these medications which have NOT CHANGED   Details  cholecalciferol (VITAMIN D) 1000 UNITS tablet Take 1,000 Units by mouth daily.      ferrous fumarate (HEMOCYTE - 106 MG FE) 325 (106 FE) MG TABS Take 1 tablet by mouth.      folic acid (FOLVITE) 1 MG tablet Take 1 mg by mouth daily.      metoprolol tartrate (LOPRESSOR) 25 MG tablet Take 25 mg by mouth 2 (two) times daily.      vitamin C (ASCORBIC ACID) 500 MG tablet Take 500 mg by mouth daily.        STOP taking these medications     prednisoLONE 5 MG TABS           Hospital Course: This is a 77 year old female who has been on good health patient started taking prednisone, then start to hear voices the voices told her to show somebody and actually she was getting a gun, she presented to the emergency room because of psychiatric problem and found to have severe hyponatremia with sodium  118, she has a previous history of hyponatremia felt to be secondary to thiazide, on August of 2012 she had a seizure when her sodium was depressed at 111. Patient presented to the hospital. 1-paranoid psychosis, this is resolved and act team consulted and accordingly psychiatric consulted and and patient cleared heart psychosis felt to be secondary to steroid, UTI and hyponatremia , 2-yponatremia patient found to have Low serum osmolality and high urine osmolality and treated with as a case of SIADH, nephrology consulted, and the demecycline added, since then her sodium show significant trend upward, her current sodium is 129,, patient advised not to drink more than 1 L of water per day, and she was discharged with demecycline.  discussed with nephrology team, she will need to followup her sodium at least every 2 weeks,  TSH within normal.urea and uric acid is low, I discussed this case with the son and daughter-in-law  and emphasizing on checking her sodium every at least 2 weeks  3-leukopenia and anemia this needs to be followed and monitored as outpatient patient is 27 a possibility of bone fibrosis is high , i did not investigate that as long as her hemoglobins and WBC remain stables. 4- CAD remain stable    Day of Discharge BP 105/59  Pulse 65  Temp(Src) 97.9 F (36.6 C) (Oral)  Resp 16  Ht 5\' 3"  (1.6 m)  Wt 58.9 kg (129 lb 13.6 oz)  BMI 23.00 kg/m2  SpO2 99%  Physical  Exam: She is lying on bed comfortably she is not on respiratory distress or shortness of breath, she is not pale or jaundiced, she is fully awake and oriented, denies any chest pain or shortness of breath, neck supple with no lymph nodes or thyromegaly, heart S1 and S2 with no added sounds, abdomen soft nontender no masses, extremities without lower limb edema peripheral pulses intact, CNS patient fully awake and oriented with no focal neurological deficit  Results for orders placed during the hospital encounter of 06/14/11  (from the past 24 hour(s))  BASIC METABOLIC PANEL     Status: Abnormal   Collection Time   06/19/11 12:46 PM      Component Value Range   Sodium 121 (*) 135 - 145 (mEq/L)   Potassium 3.9  3.5 - 5.1 (mEq/L)   Chloride 91 (*) 96 - 112 (mEq/L)   CO2 24  19 - 32 (mEq/L)   Glucose, Bld 134 (*) 70 - 99 (mg/dL)   BUN 12  6 - 23 (mg/dL)   Creatinine, Ser 0.86 (*) 0.50 - 1.10 (mg/dL)   Calcium 9.0  8.4 - 57.8 (mg/dL)   GFR calc non Af Amer >90  >90 (mL/min)   GFR calc Af Amer >90  >90 (mL/min)  BASIC METABOLIC PANEL     Status: Abnormal   Collection Time   06/19/11  4:30 PM      Component Value Range   Sodium 127 (*) 135 - 145 (mEq/L)   Potassium 4.3  3.5 - 5.1 (mEq/L)   Chloride 96  96 - 112 (mEq/L)   CO2 25  19 - 32 (mEq/L)   Glucose, Bld 110 (*) 70 - 99 (mg/dL)   BUN 12  6 - 23 (mg/dL)   Creatinine, Ser 4.69 (*) 0.50 - 1.10 (mg/dL)   Calcium 9.3  8.4 - 62.9 (mg/dL)   GFR calc non Af Amer >90  >90 (mL/min)   GFR calc Af Amer >90  >90 (mL/min)  CBC     Status: Abnormal   Collection Time   06/20/11  5:01 AM      Component Value Range   WBC 2.7 (*) 4.0 - 10.5 (K/uL)   RBC 3.26 (*) 3.87 - 5.11 (MIL/uL)   Hemoglobin 9.5 (*) 12.0 - 15.0 (g/dL)   HCT 52.8 (*) 41.3 - 46.0 (%)   MCV 86.8  78.0 - 100.0 (fL)   MCH 29.1  26.0 - 34.0 (pg)   MCHC 33.6  30.0 - 36.0 (g/dL)   RDW 24.4 (*) 01.0 - 15.5 (%)   Platelets 223  150 - 400 (K/uL)  BASIC METABOLIC PANEL     Status: Abnormal   Collection Time   06/20/11  5:01 AM      Component Value Range   Sodium 129 (*) 135 - 145 (mEq/L)   Potassium 4.0  3.5 - 5.1 (mEq/L)   Chloride 98  96 - 112 (mEq/L)   CO2 23  19 - 32 (mEq/L)   Glucose, Bld 85  70 - 99 (mg/dL)   BUN 13  6 - 23 (mg/dL)   Creatinine, Ser 2.72  0.50 - 1.10 (mg/dL)   Calcium 9.3  8.4 - 53.6 (mg/dL)   GFR calc non Af Amer 90 (*) >90 (mL/min)   GFR calc Af Amer >90  >90 (mL/min)    Disposition: Home   Follow-up Appts: Discharge Orders    Future Orders Please Complete By  Expires   Diet general      Increase  activity slowly         Follow-up with MD one week to check her sodium .   Tests Needing Follow-up:  sodium level, blood work up  Signed: Briannon Boggio I. 06/20/2011, 12:30 PM

## 2011-06-20 NOTE — Progress Notes (Signed)
CARE MANAGEMENT NOTE 06/20/2011  Patient:  [name], [name]   Account Number:  [id number]  Date Initiated:  06/15/2011  Documentation initiated by:  Mendel Corning  Subjective/Objective Assessment:   77 yr old female from home with  altered mental status. pt lives alone son checks on her has walker,  and cane     Action/Plan:   [facility]   Anticipated DC Date:  06/20/2011   Anticipated DC Plan:  HOME W HOME HEALTH SERVICES      DC Planning Services  CM consult      Choice offered to / List presented to:  C-1 Patient        HH arranged  HH-1 RN      [facility] agency  [facility]   Status of service:   Medicare Important Message given?   (If response is "NO", the following Medicare IM given date fields will be blank) Date Medicare IM given:   Date Additional Medicare IM given:    Discharge Disposition:  HOME W HOME HEALTH SERVICES  Per UR Regulation:    Comments:  06/20/2011 Mendel Corning rn bsn pt d/c home. above hh services arranged son notified that per md pt needs someone with her.

## 2011-06-20 NOTE — Progress Notes (Signed)
06/20/11 1556 Patient discharged home with sons, home health to be resumed with [facility], set up per case management. Reviewed discharge instructions with patient and sons at bedside per pt request. Given copy of instructions, med list, f/u appointment information, prescriptions. Dr Eda Paschal stated patient could continue methotrexate at home as her doctor had previously prescribed, discussed with patient. Verbalized understanding, IV site d/c'd within normal limits. Pt in stable condition, left floor in stable condition via w/c accompanied by nurse.

## 2011-06-20 NOTE — Progress Notes (Signed)
[name]  MRN: [id number]  DOB/AGE: 75-Jul-1931 75 y.o.   Admit date: 06/14/2011  Chief Complaint:  Chief Complaint  Patient presents with  . Psychiatric Evaluation    S-Pt presented on  06/14/2011 with  Chief Complaint  Patient presents with  . Psychiatric Evaluation  .    Pt today feels better.pt is happy that she is going home.  Meds    . cholecalciferol  1,000 Units Oral Daily  . demeclocycline  300 mg Oral TID  . ferrous fumarate  1 tablet Oral Daily  . folic acid  1 mg Oral Daily  . heparin  5,000 Units Subcutaneous Q8H  . metoprolol tartrate  25 mg Oral BID  . potassium chloride  20 mEq Oral BID  . senna-docusate  1 tablet Oral QHS  . sodium chloride  1 g Oral BID WC  . vitamin C  500 mg Oral Daily         WUJ:WJXBJ from the symptoms mentioned above,there are no other symptoms referable to all systems reviewed.  Physical Exam: Vital signs in last 24 hours: Temp:  [97.9 F (36.6 C)-98.3 F (36.8 C)] 97.9 F (36.6 C) (12/24 0435) Pulse Rate:  [65-71] 65  (12/24 0435) Resp:  [16-18] 16  (12/24 0435) BP: (105-133)/(59-68) 105/59 mmHg (12/24 0435) SpO2:  [97 %-99 %] 99 % (12/24 0435) Weight:  [129 lb 13.6 oz (58.9 kg)] 129 lb 13.6 oz (58.9 kg) (12/24 0435) Weight change:  Last BM Date: 06/19/11  Intake/Output from previous day: 12/23 0701 - 12/24 0700 In: 710 [P.O.:710] Out: -      Physical Exam: General appearance: alert and cooperative  Resp: clear to auscultation bilaterally  Cardio: regular rate and rhythm, S1, S2 normal, no murmur, click, rub or gallop  GI: soft, non-tender; bowel sounds normal; no masses, no organomegaly  Extremities: extremities normal, atraumatic, no cyanosis or edema   Lab Results: CBC  Basename 06/20/11 0501 06/19/11 0448  WBC 2.7* 3.0*  HGB 9.5* 11.0*  HCT 28.3* 32.5*  PLT 223 230    BMET  Basename 06/20/11 0501 06/19/11 1630  NA 129* 127*  K 4.0 4.3  CL 98 96  CO2 23 25  GLUCOSE 85 110*    BUN 13 12  CREATININE 0.50 0.47*  CALCIUM 9.3 9.3    NA Trend  123==>124==>127==>129  MICRO No results found for this or any previous visit (from the past 240 hour(s)).    Lab Results  Component Value Date   CALCIUM 9.3 06/20/2011           Impression: 1)Hyponatremia.                  Euvolemic Hyponatremia secondary to SIADH                  Responding well to Tx                  Sodium trending up.             2)HTN      BP at goal                  Rather on lower side  Medication- On Beta blockers  3)Anemia HGb at goal (9--11)   4)CAD- stable  5)Rheumatoid Arthritis  - stable   6)Acid base Co2 at goal     Plan:  Agree with Current treatment and plan. Will suggest BMP as outpt to see Na trend.  BHUTANI,MANPREET S 06/20/2011, 2:03 PM

## 2011-10-24 ENCOUNTER — Encounter (HOSPITAL_COMMUNITY): Payer: Self-pay | Admitting: *Deleted

## 2011-10-24 ENCOUNTER — Emergency Department (HOSPITAL_COMMUNITY): Payer: Medicare Other

## 2011-10-24 ENCOUNTER — Inpatient Hospital Stay (HOSPITAL_COMMUNITY)
Admission: EM | Admit: 2011-10-24 | Discharge: 2011-10-28 | DRG: 645 | Disposition: A | Discharge status: Skilled Nursing Facility | Payer: Medicare Other | Attending: Family Medicine | Admitting: Family Medicine

## 2011-10-24 DIAGNOSIS — W010XXA Fall on same level from slipping, tripping and stumbling without subsequent striking against object, initial encounter: Secondary | ICD-10-CM | POA: Diagnosis present

## 2011-10-24 DIAGNOSIS — Y92009 Unspecified place in unspecified non-institutional (private) residence as the place of occurrence of the external cause: Secondary | ICD-10-CM

## 2011-10-24 DIAGNOSIS — R5383 Other fatigue: Secondary | ICD-10-CM | POA: Diagnosis present

## 2011-10-24 DIAGNOSIS — R569 Unspecified convulsions: Secondary | ICD-10-CM | POA: Diagnosis present

## 2011-10-24 DIAGNOSIS — M542 Cervicalgia: Secondary | ICD-10-CM | POA: Diagnosis present

## 2011-10-24 DIAGNOSIS — F411 Generalized anxiety disorder: Secondary | ICD-10-CM | POA: Diagnosis present

## 2011-10-24 DIAGNOSIS — R5381 Other malaise: Secondary | ICD-10-CM | POA: Diagnosis present

## 2011-10-24 DIAGNOSIS — F29 Unspecified psychosis not due to a substance or known physiological condition: Secondary | ICD-10-CM | POA: Diagnosis present

## 2011-10-24 DIAGNOSIS — I10 Essential (primary) hypertension: Secondary | ICD-10-CM | POA: Diagnosis present

## 2011-10-24 DIAGNOSIS — S13100A Subluxation of unspecified cervical vertebrae, initial encounter: Secondary | ICD-10-CM | POA: Diagnosis present

## 2011-10-24 DIAGNOSIS — M069 Rheumatoid arthritis, unspecified: Secondary | ICD-10-CM | POA: Diagnosis present

## 2011-10-24 DIAGNOSIS — E236 Other disorders of pituitary gland: Principal | ICD-10-CM | POA: Diagnosis present

## 2011-10-24 DIAGNOSIS — E785 Hyperlipidemia, unspecified: Secondary | ICD-10-CM | POA: Diagnosis present

## 2011-10-24 DIAGNOSIS — E871 Hypo-osmolality and hyponatremia: Secondary | ICD-10-CM

## 2011-10-24 LAB — COMPREHENSIVE METABOLIC PANEL
ALT: 15 U/L (ref 0–35)
AST: 29 U/L (ref 0–37)
Albumin: 4 g/dL (ref 3.5–5.2)
Alkaline Phosphatase: 49 U/L (ref 39–117)
GFR calc Af Amer: >90 mL/min (ref >90)
Glucose, Bld: 83 mg/dL (ref 70–99)
Potassium: 3.9 mEq/L (ref 3.5–5.1)
Sodium: 120 mEq/L — ABNORMAL LOW (ref 135–145)
Total Protein: 7.4 g/dL (ref 6.0–8.3)

## 2011-10-24 LAB — DIFFERENTIAL
Eosinophils Absolute: 0.2 10*3/uL (ref 0.0–0.7)
Lymphs Abs: 1.4 10*3/uL (ref 0.7–4.0)
Neutrophils Relative %: 54 % (ref 43–77)

## 2011-10-24 LAB — URINALYSIS, ROUTINE W REFLEX MICROSCOPIC
Bilirubin Urine: NEGATIVE
Glucose, UA: NEGATIVE mg/dL
Hgb urine dipstick: NEGATIVE
pH: 6 (ref 5.0–8.0)

## 2011-10-24 LAB — URINE MICROSCOPIC-ADD ON

## 2011-10-24 LAB — CBC
MCH: 32 pg (ref 26.0–34.0)
Platelets: 250 10*3/uL (ref 150–400)
RBC: 3.44 MIL/uL — ABNORMAL LOW (ref 3.87–5.11)
WBC: 4.5 10*3/uL (ref 4.0–10.5)

## 2011-10-24 MED ORDER — SODIUM CHLORIDE 0.9 % IV SOLN: INTRAVENOUS | Status: DC

## 2011-10-24 MED ORDER — SODIUM CHLORIDE 0.9 % IV BOLUS (SEPSIS)
500.0000 mL | Freq: Once | INTRAVENOUS | Status: AC
  Administered 2011-10-25: 500 mL via INTRAVENOUS

## 2011-10-24 NOTE — ED Notes (Signed)
Pt states "I fell Sunday, I took 14 wks of therapy cause I was falling and hitting my head, Sunday when I hit my head I hit it in the back and I can't turn my head, it didn't knock me out"

## 2011-10-24 NOTE — ED Provider Notes (Signed)
History     CSN: [id number]  Arrival date & time 10/24/11  1728   First MD Initiated Contact with Patient 10/24/11 2015      Chief Complaint  Patient presents with  . Fall  . Headache    (Consider location/radiation/quality/duration/timing/severity/associated sxs/prior treatment) HPI Comments: Pt says that she was at her house and her muscles just started feeling weak and she fell to the ground, hitting her head on the floor.  This happened yesterday.  She complains of pain to her head and neck.  Says that she has been weak since starting methotrexate, which she says that she started last week, although review of records show that she has been on this before.  She denies any other injuries.  Says that her hands feel clumsier than normal and she drops things occasionally, but this has been going on for about a year.  Denies any other numbness or weakness to extremities.  No CP or SOB.  No fevers or recent illnesses.  She lives at home alone.  The history is provided by the patient.    Past Medical History  Diagnosis Date  . Hypertension   . Dysrhythmia   . Arthritis   . Fall 06/01/11    twice this year  . Incontinence of urine     wears depends    Past Surgical History  Procedure Date  . Toe surgery     great toe bilateral feet due to arthrits    No family history on file.  History  Substance Use Topics  . Smoking status: Never Smoker   . Smokeless tobacco: Never Used  . Alcohol Use: No    OB History    Grav Para Term Preterm Abortions TAB SAB Ect Mult Living                  Review of Systems  Constitutional: Negative for fever, chills, diaphoresis and fatigue.  HENT: Positive for neck pain. Negative for congestion, rhinorrhea and sneezing.   Eyes: Negative.   Respiratory: Negative for cough, chest tightness and shortness of breath.   Cardiovascular: Negative for chest pain and leg swelling.  Gastrointestinal: Negative for nausea, vomiting, abdominal pain,  diarrhea and blood in stool.  Genitourinary: Negative for frequency, hematuria, flank pain and difficulty urinating.  Musculoskeletal: Positive for joint swelling and arthralgias. Negative for back pain.       Has rhematoid arthritis  Skin: Negative for rash.  Neurological: Positive for weakness and headaches. Negative for dizziness, speech difficulty and numbness.  Psychiatric/Behavioral: Negative for confusion.    Allergies  Codeine; Colchicine; Famotidine; and Penicillins  Home Medications   Current Outpatient Rx  Name Route Sig Dispense Refill  . VITAMIN D 1000 UNITS PO TABS Oral Take 1,000 Units by mouth daily.      Marland Kitchen FERROUS FUMARATE 325 (106 FE) MG PO TABS Oral Take 1 tablet by mouth.      . FOLIC ACID 1 MG PO TABS Oral Take 1 mg by mouth daily.      Marland Kitchen METOPROLOL TARTRATE 25 MG PO TABS Oral Take 25 mg by mouth 2 (two) times daily.      Bernadette Hoit SODIUM 8.6-50 MG PO TABS Oral Take 1 tablet by mouth at bedtime. 30 tablet 0  . VITAMIN C 500 MG PO TABS Oral Take 500 mg by mouth daily.        BP 165/100  Pulse 64  Temp(Src) 98.9 F (37.2 C) (Oral)  Resp 23  Wt 125 lb (56.7 kg)  SpO2 98%  Physical Exam  Constitutional: She is oriented to person, place, and time. She appears well-developed and well-nourished.  HENT:  Head: Normocephalic and atraumatic.  Eyes: Pupils are equal, round, and reactive to light.  Neck:       Mild TTP left paraspinal area and left trapezius  Cardiovascular: Normal rate, regular rhythm and normal heart sounds.   Pulmonary/Chest: Effort normal and breath sounds normal. No respiratory distress. She has no wheezes. She has no rales. She exhibits no tenderness.  Abdominal: Soft. Bowel sounds are normal. There is no tenderness. There is no rebound and no guarding.  Musculoskeletal: Normal range of motion. She exhibits edema.       No pain to neck or back  Lymphadenopathy:    She has no cervical adenopathy.  Neurological: She is alert and  oriented to person, place, and time. She has normal strength. No cranial nerve deficit or sensory deficit. GCS eye subscore is 4. GCS verbal subscore is 5. GCS motor subscore is 6.       PT with good motor strength in upper extremities, normal sensation to LT all extremities  Skin: Skin is warm and dry. No rash noted.  Psychiatric: She has a normal mood and affect.    ED Course  Procedures (including critical care time)  Results for orders placed during the hospital encounter of 10/24/11  CBC      Component Value Range   WBC 4.5  4.0 - 10.5 (K/uL)   RBC 3.44 (*) 3.87 - 5.11 (MIL/uL)   Hemoglobin 11.0 (*) 12.0 - 15.0 (g/dL)   HCT 95.6 (*) 21.3 - 46.0 (%)   MCV 89.8  78.0 - 100.0 (fL)   MCH 32.0  26.0 - 34.0 (pg)   MCHC 35.6  30.0 - 36.0 (g/dL)   RDW 08.6  57.8 - 46.9 (%)   Platelets 250  150 - 400 (K/uL)  DIFFERENTIAL      Component Value Range   Neutrophils Relative 54  43 - 77 (%)   Neutro Abs 2.4  1.7 - 7.7 (K/uL)   Lymphocytes Relative 30  12 - 46 (%)   Lymphs Abs 1.4  0.7 - 4.0 (K/uL)   Monocytes Relative 10  3 - 12 (%)   Monocytes Absolute 0.5  0.1 - 1.0 (K/uL)   Eosinophils Relative 5  0 - 5 (%)   Eosinophils Absolute 0.2  0.0 - 0.7 (K/uL)   Basophils Relative 0  0 - 1 (%)   Basophils Absolute 0.0  0.0 - 0.1 (K/uL)  COMPREHENSIVE METABOLIC PANEL      Component Value Range   Sodium 120 (*) 135 - 145 (mEq/L)   Potassium 3.9  3.5 - 5.1 (mEq/L)   Chloride 87 (*) 96 - 112 (mEq/L)   CO2 23  19 - 32 (mEq/L)   Glucose, Bld 83  70 - 99 (mg/dL)   BUN 12  6 - 23 (mg/dL)   Creatinine, Ser 6.29  0.50 - 1.10 (mg/dL)   Calcium 9.5  8.4 - 52.8 (mg/dL)   Total Protein 7.4  6.0 - 8.3 (g/dL)   Albumin 4.0  3.5 - 5.2 (g/dL)   AST 29  0 - 37 (U/L)   ALT 15  0 - 35 (U/L)   Alkaline Phosphatase 49  39 - 117 (U/L)   Total Bilirubin 0.4  0.3 - 1.2 (mg/dL)   GFR calc non Af Amer 88 (*) >90 (mL/min)   GFR  calc Af Amer >90  >90 (mL/min)   Ct Head Wo Contrast  10/24/2011  *RADIOLOGY  REPORT*  Clinical Data:  Neck pain, fall, dizziness, history hypertension, rheumatoid arthritis  CT HEAD WITHOUT CONTRAST CT CERVICAL SPINE WITHOUT CONTRAST  Technique:  Multidetector CT imaging of the head and cervical spine was performed following the standard protocol without intravenous contrast.  Multiplanar CT image reconstructions of the cervical spine were also generated.  Comparison:  CT head 11/12/2010, CT cervical spine 07/27/2010  CT HEAD  Findings: Generalized atrophy. Stable ventricular morphology. No midline shift or mass effect. Tip of odontoid process and foramen magnum. No intracranial hemorrhage, mass lesion, or evidence of acute infarction. No definite extra-axial fluid collections. Visualized paranasal sinuses and mastoid air cells clear. Calvaria intact.  IMPRESSION: No acute intracranial abnormalities, see below  CT CERVICAL SPINE  Findings: Visualized skull base intact. Chronic malalignment of C1-C2 with posterior subluxation of C2 with respect C1. Predental space measures 10 mm AP. Posterior displacement of C2 places the tip of the odontoid process within the foramen magnum and causes marked AP narrowing of the canal, with residual AP diameter of  5.3 mm between the posterior margin of the odontoid and the anterior margin of the posterior arch of C1. This is likely causing marked compression of the cervicomedullary junction. However this finding is not significantly changed since the prior exam.  Prevertebral soft tissues are normal appearance. Bones appear demineralized. Vertebral body heights maintained without fracture or additional subluxation. Significant degenerative changes of the C1-C2 articulations are identified, greater on the right. Biapical lung scarring identified.  Osseous demineralization.  IMPRESSION: Chronic retrolisthesis of C2 versus C1 with marked widening of the predental space to 10 mm AP and marked narrowing of the spinal canal to 5.3 mm AP diameter between the  odontoid process and posterior arch C1. This is likely causing marked compression of the cervicomedullary junction but is not significantly changed since previous study of 07/27/2010. No new osseous findings identified.  Original Report Authenticated By: Lollie Marrow, M.D.   Ct Cervical Spine Wo Contrast  10/24/2011  *RADIOLOGY REPORT*  Clinical Data:  Neck pain, fall, dizziness, history hypertension, rheumatoid arthritis  CT HEAD WITHOUT CONTRAST CT CERVICAL SPINE WITHOUT CONTRAST  Technique:  Multidetector CT imaging of the head and cervical spine was performed following the standard protocol without intravenous contrast.  Multiplanar CT image reconstructions of the cervical spine were also generated.  Comparison:  CT head 11/12/2010, CT cervical spine 07/27/2010  CT HEAD  Findings: Generalized atrophy. Stable ventricular morphology. No midline shift or mass effect. Tip of odontoid process and foramen magnum. No intracranial hemorrhage, mass lesion, or evidence of acute infarction. No definite extra-axial fluid collections. Visualized paranasal sinuses and mastoid air cells clear. Calvaria intact.  IMPRESSION: No acute intracranial abnormalities, see below  CT CERVICAL SPINE  Findings: Visualized skull base intact. Chronic malalignment of C1-C2 with posterior subluxation of C2 with respect C1. Predental space measures 10 mm AP. Posterior displacement of C2 places the tip of the odontoid process within the foramen magnum and causes marked AP narrowing of the canal, with residual AP diameter of  5.3 mm between the posterior margin of the odontoid and the anterior margin of the posterior arch of C1. This is likely causing marked compression of the cervicomedullary junction. However this finding is not significantly changed since the prior exam.  Prevertebral soft tissues are normal appearance. Bones appear demineralized. Vertebral body heights maintained without fracture or additional subluxation. Significant  degenerative changes of the C1-C2 articulations are identified, greater on the right. Biapical lung scarring identified.  Osseous demineralization.  IMPRESSION: Chronic retrolisthesis of C2 versus C1 with marked widening of the predental space to 10 mm AP and marked narrowing of the spinal canal to 5.3 mm AP diameter between the odontoid process and posterior arch C1. This is likely causing marked compression of the cervicomedullary junction but is not significantly changed since previous study of 07/27/2010. No new osseous findings identified.  Original Report Authenticated By: Lollie Marrow, M.D.     Ct Head Wo Contrast  10/24/2011  *RADIOLOGY REPORT*  Clinical Data:  Neck pain, fall, dizziness, history hypertension, rheumatoid arthritis  CT HEAD WITHOUT CONTRAST CT CERVICAL SPINE WITHOUT CONTRAST  Technique:  Multidetector CT imaging of the head and cervical spine was performed following the standard protocol without intravenous contrast.  Multiplanar CT image reconstructions of the cervical spine were also generated.  Comparison:  CT head 11/12/2010, CT cervical spine 07/27/2010  CT HEAD  Findings: Generalized atrophy. Stable ventricular morphology. No midline shift or mass effect. Tip of odontoid process and foramen magnum. No intracranial hemorrhage, mass lesion, or evidence of acute infarction. No definite extra-axial fluid collections. Visualized paranasal sinuses and mastoid air cells clear. Calvaria intact.  IMPRESSION: No acute intracranial abnormalities, see below  CT CERVICAL SPINE  Findings: Visualized skull base intact. Chronic malalignment of C1-C2 with posterior subluxation of C2 with respect C1. Predental space measures 10 mm AP. Posterior displacement of C2 places the tip of the odontoid process within the foramen magnum and causes marked AP narrowing of the canal, with residual AP diameter of  5.3 mm between the posterior margin of the odontoid and the anterior margin of the posterior arch  of C1. This is likely causing marked compression of the cervicomedullary junction. However this finding is not significantly changed since the prior exam.  Prevertebral soft tissues are normal appearance. Bones appear demineralized. Vertebral body heights maintained without fracture or additional subluxation. Significant degenerative changes of the C1-C2 articulations are identified, greater on the right. Biapical lung scarring identified.  Osseous demineralization.  IMPRESSION: Chronic retrolisthesis of C2 versus C1 with marked widening of the predental space to 10 mm AP and marked narrowing of the spinal canal to 5.3 mm AP diameter between the odontoid process and posterior arch C1. This is likely causing marked compression of the cervicomedullary junction but is not significantly changed since previous study of 07/27/2010. No new osseous findings identified.  Original Report Authenticated By: Lollie Marrow, M.D.   Ct Cervical Spine Wo Contrast  10/24/2011  *RADIOLOGY REPORT*  Clinical Data:  Neck pain, fall, dizziness, history hypertension, rheumatoid arthritis  CT HEAD WITHOUT CONTRAST CT CERVICAL SPINE WITHOUT CONTRAST  Technique:  Multidetector CT imaging of the head and cervical spine was performed following the standard protocol without intravenous contrast.  Multiplanar CT image reconstructions of the cervical spine were also generated.  Comparison:  CT head 11/12/2010, CT cervical spine 07/27/2010  CT HEAD  Findings: Generalized atrophy. Stable ventricular morphology. No midline shift or mass effect. Tip of odontoid process and foramen magnum. No intracranial hemorrhage, mass lesion, or evidence of acute infarction. No definite extra-axial fluid collections. Visualized paranasal sinuses and mastoid air cells clear. Calvaria intact.  IMPRESSION: No acute intracranial abnormalities, see below  CT CERVICAL SPINE  Findings: Visualized skull base intact. Chronic malalignment of C1-C2 with posterior  subluxation of C2 with respect C1. Predental space measures 10 mm AP. Posterior displacement  of C2 places the tip of the odontoid process within the foramen magnum and causes marked AP narrowing of the canal, with residual AP diameter of  5.3 mm between the posterior margin of the odontoid and the anterior margin of the posterior arch of C1. This is likely causing marked compression of the cervicomedullary junction. However this finding is not significantly changed since the prior exam.  Prevertebral soft tissues are normal appearance. Bones appear demineralized. Vertebral body heights maintained without fracture or additional subluxation. Significant degenerative changes of the C1-C2 articulations are identified, greater on the right. Biapical lung scarring identified.  Osseous demineralization.  IMPRESSION: Chronic retrolisthesis of C2 versus C1 with marked widening of the predental space to 10 mm AP and marked narrowing of the spinal canal to 5.3 mm AP diameter between the odontoid process and posterior arch C1. This is likely causing marked compression of the cervicomedullary junction but is not significantly changed since previous study of 07/27/2010. No new osseous findings identified.  Original Report Authenticated By: Lollie Marrow, M.D.     1. Hyponatremia       MDM  Pt with marked hyponatremia, could be cause of her weakness.  Was admitted for similar spell in December.  Pt with marked c-spine abnormality, but does not appear changed from last CT.  No neuro deficits or significant bony tenderness to neck.  I reviewed records and could not find a note where this has been addressed in past and pt does not seem to be aware of it, but does not appear to have an acute change.  Will consult hospitalist for admission.  PMD is Dorris Fetch with [employer]        Rolan Bucco, MD 10/24/11 [phone]

## 2011-10-24 NOTE — ED Notes (Signed)
MD at bedside. 

## 2011-10-25 ENCOUNTER — Inpatient Hospital Stay (HOSPITAL_COMMUNITY)
Admit: 2011-10-25 | Discharge: 2011-10-25 | Disposition: A | Discharge status: Home / Self Care | Payer: Medicare Other | Attending: Internal Medicine | Admitting: Internal Medicine

## 2011-10-25 ENCOUNTER — Inpatient Hospital Stay (HOSPITAL_COMMUNITY): Payer: Medicare Other

## 2011-10-25 LAB — TSH: TSH: 3.226 u[IU]/mL (ref 0.350–4.500)

## 2011-10-25 LAB — BASIC METABOLIC PANEL
BUN: 8 mg/dL (ref 6–23)
BUN: 7 mg/dL (ref 6–23)
BUN: 6 mg/dL (ref 6–23)
BUN: 6 mg/dL (ref 6–23)
CO2: 23 mEq/L (ref 19–32)
CO2: 23 mEq/L (ref 19–32)
CO2: 24 mEq/L (ref 19–32)
Calcium: 9 mg/dL (ref 8.4–10.5)
Calcium: 8.9 mg/dL (ref 8.4–10.5)
Creatinine, Ser: 0.49 mg/dL — ABNORMAL LOW (ref 0.50–1.10)
Creatinine, Ser: 0.42 mg/dL — ABNORMAL LOW (ref 0.50–1.10)
Creatinine, Ser: 0.47 mg/dL — ABNORMAL LOW (ref 0.50–1.10)
GFR calc Af Amer: >90 mL/min (ref >90)
GFR calc non Af Amer: >90 mL/min (ref >90)
GFR calc non Af Amer: >90 mL/min (ref >90)
GFR calc non Af Amer: >90 mL/min (ref >90)
GFR calc non Af Amer: >90 mL/min (ref >90)
Glucose, Bld: 117 mg/dL — ABNORMAL HIGH (ref 70–99)
Glucose, Bld: 109 mg/dL — ABNORMAL HIGH (ref 70–99)
Glucose, Bld: 104 mg/dL — ABNORMAL HIGH (ref 70–99)
Glucose, Bld: 104 mg/dL — ABNORMAL HIGH (ref 70–99)
Potassium: 3.7 mEq/L (ref 3.5–5.1)
Sodium: 121 mEq/L — ABNORMAL LOW (ref 135–145)
Sodium: 126 mEq/L — ABNORMAL LOW (ref 135–145)

## 2011-10-25 LAB — CBC
MCH: 32 pg (ref 26.0–34.0)
MCHC: 35.9 g/dL (ref 30.0–36.0)
MCV: 89.1 fL (ref 78.0–100.0)
Platelets: 256 10*3/uL (ref 150–400)
RBC: 3.66 MIL/uL — ABNORMAL LOW (ref 3.87–5.11)
RDW: 13.8 % (ref 11.5–15.5)

## 2011-10-25 MED ORDER — PHENYTOIN SODIUM EXTENDED 100 MG PO CAPS
100.0000 mg | ORAL_CAPSULE | Freq: Three times a day (TID) | ORAL | Status: DC
  Administered 2011-10-25 – 2011-10-27 (×8): 100 mg via ORAL
  Filled 2011-10-25 (×12): qty 1

## 2011-10-25 MED ORDER — GADOBENATE DIMEGLUMINE 529 MG/ML IV SOLN
15.0000 mL | Freq: Once | INTRAVENOUS | Status: AC | PRN
  Administered 2011-10-25: 11 mL via INTRAVENOUS

## 2011-10-25 MED ORDER — ACETAMINOPHEN 325 MG PO TABS: 650.0000 mg | ORAL_TABLET | Freq: Four times a day (QID) | ORAL | Status: DC | PRN

## 2011-10-25 MED ORDER — VITAMIN D3 25 MCG (1000 UNIT) PO TABS
1000.0000 [IU] | ORAL_TABLET | Freq: Every day | ORAL | Status: DC
  Administered 2011-10-25 – 2011-10-28 (×4): 1000 [IU] via ORAL
  Filled 2011-10-25 (×4): qty 1

## 2011-10-25 MED ORDER — METOPROLOL TARTRATE 25 MG PO TABS
25.0000 mg | ORAL_TABLET | Freq: Two times a day (BID) | ORAL | Status: DC
  Administered 2011-10-26 – 2011-10-28 (×3): 25 mg via ORAL
  Filled 2011-10-25 (×9): qty 1

## 2011-10-25 MED ORDER — ACETAMINOPHEN 650 MG RE SUPP: 650.0000 mg | Freq: Four times a day (QID) | RECTAL | Status: DC | PRN

## 2011-10-25 MED ORDER — ZOLPIDEM TARTRATE 5 MG PO TABS
5.0000 mg | ORAL_TABLET | Freq: Every evening | ORAL | Status: DC | PRN
  Filled 2011-10-25: qty 1

## 2011-10-25 MED ORDER — SENNOSIDES-DOCUSATE SODIUM 8.6-50 MG PO TABS
1.0000 | ORAL_TABLET | Freq: Every day | ORAL | Status: DC
  Administered 2011-10-25 – 2011-10-27 (×3): 1 via ORAL
  Filled 2011-10-25 (×4): qty 1

## 2011-10-25 MED ORDER — DEXTROSE-NACL 5-0.9 % IV SOLN
INTRAVENOUS | Status: DC
  Administered 2011-10-25: 02:00:00 via INTRAVENOUS

## 2011-10-25 MED ORDER — ASPIRIN EC 81 MG PO TBEC
81.0000 mg | DELAYED_RELEASE_TABLET | Freq: Every day | ORAL | Status: DC
Start: 2011-10-25 — End: 2011-10-28
  Administered 2011-10-25 – 2011-10-28 (×4): 81 mg via ORAL
  Filled 2011-10-25 (×5): qty 1

## 2011-10-25 MED ORDER — SODIUM CHLORIDE 3 % IV SOLN
INTRAVENOUS | Status: AC
  Administered 2011-10-25: 11:00:00 via INTRAVENOUS
  Filled 2011-10-25: qty 500

## 2011-10-25 MED ORDER — LORAZEPAM 2 MG/ML IJ SOLN
1.0000 mg | Freq: Once | INTRAMUSCULAR | Status: AC | PRN
  Administered 2011-10-25: 1 mg via INTRAVENOUS
  Filled 2011-10-25: qty 1

## 2011-10-25 MED ORDER — ALUM & MAG HYDROXIDE-SIMETH 200-200-20 MG/5ML PO SUSP: 30.0000 mL | Freq: Four times a day (QID) | ORAL | Status: DC | PRN

## 2011-10-25 MED ORDER — VITAMIN C 500 MG PO TABS
500.0000 mg | ORAL_TABLET | Freq: Every day | ORAL | Status: DC
  Administered 2011-10-25 – 2011-10-28 (×4): 500 mg via ORAL
  Filled 2011-10-25 (×4): qty 1

## 2011-10-25 MED ORDER — SODIUM CHLORIDE 0.9 % IV SOLN
1100.0000 mg | Freq: Once | INTRAVENOUS | Status: AC
  Administered 2011-10-25: 1100 mg via INTRAVENOUS
  Filled 2011-10-25: qty 22

## 2011-10-25 MED ORDER — ENOXAPARIN SODIUM 40 MG/0.4ML ~~LOC~~ SOLN
40.0000 mg | SUBCUTANEOUS | Status: DC
  Administered 2011-10-25 – 2011-10-28 (×4): 40 mg via SUBCUTANEOUS
  Filled 2011-10-25 (×4): qty 0.4

## 2011-10-25 MED ORDER — ONDANSETRON HCL 4 MG PO TABS: 4.0000 mg | ORAL_TABLET | Freq: Four times a day (QID) | ORAL | Status: DC | PRN

## 2011-10-25 MED ORDER — ONDANSETRON HCL 4 MG/2ML IJ SOLN: 4.0000 mg | Freq: Four times a day (QID) | INTRAMUSCULAR | Status: DC | PRN

## 2011-10-25 NOTE — Progress Notes (Addendum)
Sedated after ativan admin. Agitated during MRI, 1mg  ativan iv given. Na+ improving, urine osmo pending. Updates to family at bedside.

## 2011-10-25 NOTE — H&P (Signed)
PCP: Dr. Bufford Spikes of [facility]   Chief Complaint: Weakness and falling   HPI: [name] is an 78 y.o. female with history of psychiatric disorders including anxiety and prior psychosis, severe rheumatoid arthritis with cervical subluxation, prior history of hyponatremia, frequent falls, noncompliant with medication versus confusion with her medications, brought into the emergency room by her son as she fell yesterday hitting her head in the occiput. She stated that she been feeling weak for about 5 days. She denied headache, palpitation, vertigo, chest pain or shortness of breath, focal neurological weakness, cough, fever or chills. Evaluation in emergency room included a head CT which showed no acute process, a cervical CT shows severe subluxation of C1 and C2 that is unchanged from one year ago. Her serology showed hyponatremia with a serum sodium of 120 mEq per liter. The UA showed no evidence of infection. She has a normal white count, with hemoglobin of 11 g per decaliter, normal renal function, and normal liver function tests. It was thought that perhaps her serum sodium was the cause of her weakness, and because she lives alone, hospitalist was asked to admit her for further evaluation and treatment.  Rewiew of Systems:  The patient denies anorexia, fever, weight loss,, vision loss, decreased hearing, hoarseness, chest pain, syncope, dyspnea on exertion, peripheral edema, balance deficits, hemoptysis, abdominal pain, melena, hematochezia, severe indigestion/heartburn, hematuria, incontinence, genital sores, suspicious skin lesions, transient blindness, difficulty walking, depression, unusual weight change, abnormal bleeding, enlarged lymph nodes, angioedema, and breast masses.   Past Medical History  Diagnosis Date  . Hypertension   . Dysrhythmia   . Arthritis   . Fall 06/01/11    twice this year  . Incontinence of urine     wears depends    Past Surgical History   Procedure Date  . Toe surgery     great toe bilateral feet due to arthrits    Medications:  HOME MEDS: Prior to Admission medications   Medication Sig Start Date End Date Taking? Authorizing Provider  cholecalciferol (VITAMIN D) 1000 UNITS tablet Take 1,000 Units by mouth daily.     Yes Historical Provider, MD  ferrous fumarate (HEMOCYTE - 106 MG FE) 325 (106 FE) MG TABS Take 1 tablet by mouth.     Yes Historical Provider, MD  folic acid (FOLVITE) 1 MG tablet Take 1 mg by mouth daily.     Yes Historical Provider, MD  metoprolol tartrate (LOPRESSOR) 25 MG tablet Take 25 mg by mouth 2 (two) times daily.     Yes Historical Provider, MD  senna-docusate (SENOKOT-S) 8.6-50 MG per tablet Take 1 tablet by mouth at bedtime. 06/20/11 06/19/12 Yes Hind I Elsaid, MD  vitamin C (ASCORBIC ACID) 500 MG tablet Take 500 mg by mouth daily.     Yes Historical Provider, MD     Allergies:  Allergies  Allergen Reactions  . Codeine Anxiety  . Colchicine Anxiety  . Famotidine Anxiety  . Penicillins Anxiety    Social History:   reports that she has never smoked. She has never used smokeless tobacco. She reports that she does not drink alcohol or use illicit drugs.  Family History: No family history on file.   Physical Exam: Filed Vitals:   10/24/11 1832 10/24/11 1838  BP:  165/100  Pulse:  64  Temp:  98.9 F (37.2 C)  TempSrc:  Oral  Resp:  23  Weight: 56.7 kg (125 lb)   SpO2:  98%   Blood pressure  165/100, pulse 64, temperature 98.9 F (37.2 C), temperature source Oral, resp. rate 23, weight 56.7 kg (125 lb), SpO2 98.00%.  GEN:  Pleasant person lying in the stretcher in no acute distress; cooperative with exam PSYCH:  alert and oriented x4; does not appear anxious or depressed; affect is appropriate. Conversation is very tangential. HEENT: Mucous membranes pink and anicteric; PERRLA; EOM intact; no cervical lymphadenopathy nor thyromegaly or carotid bruit; no JVD; Breasts:: Not  examined CHEST WALL: No tenderness CHEST: Normal respiration, clear to auscultation bilaterally HEART: Regular rate and rhythm; no murmurs rubs or gallops BACK: No kyphosis or scoliosis; no CVA tenderness ABDOMEN: soft non-tender; no masses, no organomegaly, normal abdominal bowel sounds; no pannus; no intertriginous candida. Rectal Exam: Not done EXTREMITIES: Severe joint deformity consistent with rheumatoid arthritis; no edema; no ulcerations. Genitalia: not examined PULSES: 2+ and symmetric SKIN: Normal hydration no rash or ulceration CNS: Cranial nerves 2-12 grossly intact no focal lateralizing neurologic deficit   Labs & Imaging Results for orders placed during the hospital encounter of 10/24/11 (from the past 48 hour(s))  CBC     Status: Abnormal   Collection Time   10/24/11  9:16 PM      Component Value Range Comment   WBC 4.5  4.0 - 10.5 (K/uL)    RBC 3.44 (*) 3.87 - 5.11 (MIL/uL)    Hemoglobin 11.0 (*) 12.0 - 15.0 (g/dL)    HCT 81.1 (*) 91.4 - 46.0 (%)    MCV 89.8  78.0 - 100.0 (fL)    MCH 32.0  26.0 - 34.0 (pg)    MCHC 35.6  30.0 - 36.0 (g/dL)    RDW 78.2  95.6 - 21.3 (%)    Platelets 250  150 - 400 (K/uL)   DIFFERENTIAL     Status: Normal   Collection Time   10/24/11  9:16 PM      Component Value Range Comment   Neutrophils Relative 54  43 - 77 (%)    Neutro Abs 2.4  1.7 - 7.7 (K/uL)    Lymphocytes Relative 30  12 - 46 (%)    Lymphs Abs 1.4  0.7 - 4.0 (K/uL)    Monocytes Relative 10  3 - 12 (%)    Monocytes Absolute 0.5  0.1 - 1.0 (K/uL)    Eosinophils Relative 5  0 - 5 (%)    Eosinophils Absolute 0.2  0.0 - 0.7 (K/uL)    Basophils Relative 0  0 - 1 (%)    Basophils Absolute 0.0  0.0 - 0.1 (K/uL)   COMPREHENSIVE METABOLIC PANEL     Status: Abnormal   Collection Time   10/24/11  9:16 PM      Component Value Range Comment   Sodium 120 (*) 135 - 145 (mEq/L)    Potassium 3.9  3.5 - 5.1 (mEq/L)    Chloride 87 (*) 96 - 112 (mEq/L)    CO2 23  19 - 32 (mEq/L)     Glucose, Bld 83  70 - 99 (mg/dL)    BUN 12  6 - 23 (mg/dL)    Creatinine, Ser 0.86  0.50 - 1.10 (mg/dL)    Calcium 9.5  8.4 - 10.5 (mg/dL)    Total Protein 7.4  6.0 - 8.3 (g/dL)    Albumin 4.0  3.5 - 5.2 (g/dL)    AST 29  0 - 37 (U/L)    ALT 15  0 - 35 (U/L)    Alkaline Phosphatase 49  39 -  117 (U/L)    Total Bilirubin 0.4  0.3 - 1.2 (mg/dL)    GFR calc non Af Amer 88 (*) >90 (mL/min)    GFR calc Af Amer >90  >90 (mL/min)   URINALYSIS, ROUTINE W REFLEX MICROSCOPIC     Status: Abnormal   Collection Time   10/24/11 11:13 PM      Component Value Range Comment   Color, Urine YELLOW  YELLOW     APPearance CLOUDY (*) CLEAR     Specific Gravity, Urine 1.026  1.005 - 1.030     pH 6.0  5.0 - 8.0     Glucose, UA NEGATIVE  NEGATIVE (mg/dL)    Hgb urine dipstick NEGATIVE  NEGATIVE     Bilirubin Urine NEGATIVE  NEGATIVE     Ketones, ur TRACE (*) NEGATIVE (mg/dL)    Protein, ur NEGATIVE  NEGATIVE (mg/dL)    Urobilinogen, UA 0.2  0.0 - 1.0 (mg/dL)    Nitrite NEGATIVE  NEGATIVE     Leukocytes, UA SMALL (*) NEGATIVE    URINE MICROSCOPIC-ADD ON     Status: Abnormal   Collection Time   10/24/11 11:13 PM      Component Value Range Comment   Squamous Epithelial / LPF FEW (*) RARE     WBC, UA 0-2  <3 (WBC/hpf)    Bacteria, UA RARE  RARE     Crystals CA OXALATE CRYSTALS (*) NEGATIVE     Urine-Other MUCOUS PRESENT      Ct Head Wo Contrast  10/24/2011  *RADIOLOGY REPORT*  Clinical Data:  Neck pain, fall, dizziness, history hypertension, rheumatoid arthritis  CT HEAD WITHOUT CONTRAST CT CERVICAL SPINE WITHOUT CONTRAST  Technique:  Multidetector CT imaging of the head and cervical spine was performed following the standard protocol without intravenous contrast.  Multiplanar CT image reconstructions of the cervical spine were also generated.  Comparison:  CT head 11/12/2010, CT cervical spine 07/27/2010  CT HEAD  Findings: Generalized atrophy. Stable ventricular morphology. No midline shift or mass effect.  Tip of odontoid process and foramen magnum. No intracranial hemorrhage, mass lesion, or evidence of acute infarction. No definite extra-axial fluid collections. Visualized paranasal sinuses and mastoid air cells clear. Calvaria intact.  IMPRESSION: No acute intracranial abnormalities, see below  CT CERVICAL SPINE  Findings: Visualized skull base intact. Chronic malalignment of C1-C2 with posterior subluxation of C2 with respect C1. Predental space measures 10 mm AP. Posterior displacement of C2 places the tip of the odontoid process within the foramen magnum and causes marked AP narrowing of the canal, with residual AP diameter of  5.3 mm between the posterior margin of the odontoid and the anterior margin of the posterior arch of C1. This is likely causing marked compression of the cervicomedullary junction. However this finding is not significantly changed since the prior exam.  Prevertebral soft tissues are normal appearance. Bones appear demineralized. Vertebral body heights maintained without fracture or additional subluxation. Significant degenerative changes of the C1-C2 articulations are identified, greater on the right. Biapical lung scarring identified.  Osseous demineralization.  IMPRESSION: Chronic retrolisthesis of C2 versus C1 with marked widening of the predental space to 10 mm AP and marked narrowing of the spinal canal to 5.3 mm AP diameter between the odontoid process and posterior arch C1. This is likely causing marked compression of the cervicomedullary junction but is not significantly changed since previous study of 07/27/2010. No new osseous findings identified.  Original Report Authenticated By: Lollie Marrow, M.D.   Ct  Cervical Spine Wo Contrast  10/24/2011  *RADIOLOGY REPORT*  Clinical Data:  Neck pain, fall, dizziness, history hypertension, rheumatoid arthritis  CT HEAD WITHOUT CONTRAST CT CERVICAL SPINE WITHOUT CONTRAST  Technique:  Multidetector CT imaging of the head and cervical  spine was performed following the standard protocol without intravenous contrast.  Multiplanar CT image reconstructions of the cervical spine were also generated.  Comparison:  CT head 11/12/2010, CT cervical spine 07/27/2010  CT HEAD  Findings: Generalized atrophy. Stable ventricular morphology. No midline shift or mass effect. Tip of odontoid process and foramen magnum. No intracranial hemorrhage, mass lesion, or evidence of acute infarction. No definite extra-axial fluid collections. Visualized paranasal sinuses and mastoid air cells clear. Calvaria intact.  IMPRESSION: No acute intracranial abnormalities, see below  CT CERVICAL SPINE  Findings: Visualized skull base intact. Chronic malalignment of C1-C2 with posterior subluxation of C2 with respect C1. Predental space measures 10 mm AP. Posterior displacement of C2 places the tip of the odontoid process within the foramen magnum and causes marked AP narrowing of the canal, with residual AP diameter of  5.3 mm between the posterior margin of the odontoid and the anterior margin of the posterior arch of C1. This is likely causing marked compression of the cervicomedullary junction. However this finding is not significantly changed since the prior exam.  Prevertebral soft tissues are normal appearance. Bones appear demineralized. Vertebral body heights maintained without fracture or additional subluxation. Significant degenerative changes of the C1-C2 articulations are identified, greater on the right. Biapical lung scarring identified.  Osseous demineralization.  IMPRESSION: Chronic retrolisthesis of C2 versus C1 with marked widening of the predental space to 10 mm AP and marked narrowing of the spinal canal to 5.3 mm AP diameter between the odontoid process and posterior arch C1. This is likely causing marked compression of the cervicomedullary junction but is not significantly changed since previous study of 07/27/2010. No new osseous findings identified.   Original Report Authenticated By: Lollie Marrow, M.D.      Assessment Present on Admission:  .Hyponatremia .Psychosis .Rheumatoid arthritis .HYPERTENSION .HYPERLIPIDEMIA .Cervical subluxation .ANXIETY   PLAN: Will admit her. Start normal saline and will try to correct her serum sodium. Once corrected, reevaluate to see if she can be at home safely. At some point, her methotrexate should be restarted. She does have very tangential conversation, and it is difficult to determine if she has true psychosis. The cervical subluxation seems chronic and typical of rheumatoid disease. If her blood pressure is elevated, I would not resume her HCTZ. Perhaps another antihypertensive medication would be more appropriate for her. She is stable, full code, and will be [facility].   Other plans as per orders.    Demetris Meinhardt 10/25/2011, 12:13 AM

## 2011-10-25 NOTE — Progress Notes (Signed)
RN called to report that patient had a very transient (few seconds) of focal facial twitches, highly suggestive of focal seizure.  Will obtain MRI with and without contrast in the morning, along with EEG.  No anticonvulsive was started at this time.

## 2011-10-25 NOTE — Progress Notes (Signed)
Patient now had an episode of TC Sz with incontinence and post ictal period.  Hemodynamically stable. Episode was witnessed by RN.  It lasted about 2 minutes.  She is now more alert.  I have spoken with Neurologist, who was kind enough to consult.  He will start her on Dilantin.  I will transfer her to SDU.  This may have been the reason for her falls.  Since hyponatremia was more chronic, no hypertonic saline will be started.

## 2011-10-25 NOTE — Progress Notes (Signed)
Portable EEG completed

## 2011-10-25 NOTE — Progress Notes (Addendum)
Sedated after ativan admin. Hypertonic saline infusing. Foley to bedside drain due to incontinence/ confusion. Bed alarm to protect pt from falls.

## 2011-10-25 NOTE — Progress Notes (Addendum)
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 Not on Psychotropic medications, HCTZ Dced back in August Unclear if anti-epileptics needed, Seizure precautions  Zannie Cove, MD [facility] [phone]

## 2011-10-25 NOTE — Significant Event (Signed)
Rapid Response Event Note  Overview: Time Called: 0410 Arrival Time: 0415 Event Type: Neurologic  Initial Focused Assessment: Arrived and patient obtunded and v/s stable. Pt has a NRB on and staff at bedside witnessed seizures focal followed by tonic clonic. Pt obtunded at present and pupils round small and sluggish. It was reported that pt's sats decreased with seizure. Pt woke within approx. 10 minutes and was verbal, able to follow commands.   Interventions: observation and transfer to stepdown.Dr Conley Rolls saw pt in room.    Event Summary: Name of Physician Notified: Dr. Conley Rolls at 0430    at    Outcome: Transferred (Comment)  Event End Time: 1610  Bayard Hugger

## 2011-10-25 NOTE — ED Notes (Signed)
Patient attempted to void in cup and missed cup or forgot to go in cup.  

## 2011-10-25 NOTE — Procedures (Signed)
EEG NUMBER:  REFERRING PHYSICIAN:  Dr. Nedra Hai.  HISTORY:  A 78 year old female with seizure.  MEDICATIONS:  Dilantin, Ativan, Lopressor, Lovenox, aspirin, vitamin C, and vitamin D.  CONDITIONS OF RECORDING:  This is a 16 channel EEG carried out with the patient in the lethargic state.  DESCRIPTION:  The background activity is marred by muscle and movement artifact.  Due to the predominance of muscle and movement artifact at times, the background activity was difficult to evaluate.  This is particularly the case over the right temporal region.  There were episodes during the tracing though when the posterior background rhythm could be evaluated.  For the majority of the tracing, this was slow at a 7 Hz theta activity seen from the parieto-occipital and posterotemporal regions although on rare occasions, the posterior background rhythm, did reach a 9 Hz alpha rhythm.  Theta rhythms were most predominant from the central and temporal regions.  Faster rhythms were noted anteriorly and at times, superimposed on more posterior rhythms.  No evidence of stage II sleep was noted.  Hypoventilation and intermittent photic stimulation were not performed secondary to patient's ability to tolerate procedures.  IMPRESSION:  This EEG is characterized by slowing which is consistent with the patient being lethargic at the time of EEG acquisition.  The patient was able to achieve a normal posterior background rhythm although difficult to sustain.  No epileptiform activity was noted.          ______________________________ Thana Farr, MD    [id number] D:  10/25/2011 18:05:07  T:  10/25/2011 22:00:30  Job #:  [id number]

## 2011-10-25 NOTE — Progress Notes (Signed)
CSW met with Pt's son and daughter in law to check in and assess needs. Son, [name] frustrated as Pt was in hospital in Dec. For similar reason. He states Pt recently had medications changed by her MD. Pt lives alone, but family checks on her frequently. Support provided to family. They are aware CSW available to assist.  Vennie Homans, LCSWA 10/25/2011 11:44 AM

## 2011-10-25 NOTE — Consult Note (Signed)
Reason for Consult: "seizures"  HPI: [name] is an 78 y.o. Female who had some focal facial twitching that later progressed to a generalized  tonic-clonic seizure with incontinence and post-ictal period. Patient does not have a history of epilepsy.  Past Medical History  Diagnosis Date  . Hypertension   . Dysrhythmia   . Arthritis   . Fall 06/01/11    twice this year  . Incontinence of urine     wears depends   Medications: I have reviewed the patient's current medications.  Past Surgical History  Procedure Date  . Toe surgery     great toe bilateral feet due to arthrits   No family history on file.  Social History:  reports that she has never smoked. She has never used smokeless tobacco. She reports that she does not drink alcohol or use illicit drugs.  Allergies:  Allergies  Allergen Reactions  . Codeine Anxiety  . Colchicine Anxiety  . Famotidine Anxiety  . Penicillins Anxiety   ROS: as above  Blood pressure 161/77, pulse 69, temperature 98 F (36.7 C), temperature source Oral, resp. rate 17, height 5\' 2"  (1.575 m), weight 56.7 kg (125 lb), SpO2 100.00%.  Neurological exam: AAO*2. No aphasia. Followed simple commands. Cranial nerves: EOMI, PERRL. Visual fields were full. Sensation to V1 through V3 areas of the face was intact and symmetric throughout. There was no facial asymmetry. Hearing to finger rub was equal and symmetrical bilaterally. Shoulder shrug was 5/5 and symmetric bilaterally. Head rotation was 5/5 bilaterally. There was no dysarthria or palatal deviation. Coordination: finger-to-nose were intact and symmetric bilaterally. Reflexes: were 1+ in upper extremities and trace at the knees and 0 at the ankles. Plantar response was mute bilaterally. Gait: deferred.  Results for orders placed during the hospital encounter of 10/24/11 (from the past 48 hour(s))  CBC     Status: Abnormal   Collection Time   10/24/11  9:16 PM      Component Value Range  Comment   WBC 4.5  4.0 - 10.5 (K/uL)    RBC 3.44 (*) 3.87 - 5.11 (MIL/uL)    Hemoglobin 11.0 (*) 12.0 - 15.0 (g/dL)    HCT 95.6 (*) 21.3 - 46.0 (%)    MCV 89.8  78.0 - 100.0 (fL)    MCH 32.0  26.0 - 34.0 (pg)    MCHC 35.6  30.0 - 36.0 (g/dL)    RDW 08.6  57.8 - 46.9 (%)    Platelets 250  150 - 400 (K/uL)   DIFFERENTIAL     Status: Normal   Collection Time   10/24/11  9:16 PM      Component Value Range Comment   Neutrophils Relative 54  43 - 77 (%)    Neutro Abs 2.4  1.7 - 7.7 (K/uL)    Lymphocytes Relative 30  12 - 46 (%)    Lymphs Abs 1.4  0.7 - 4.0 (K/uL)    Monocytes Relative 10  3 - 12 (%)    Monocytes Absolute 0.5  0.1 - 1.0 (K/uL)    Eosinophils Relative 5  0 - 5 (%)    Eosinophils Absolute 0.2  0.0 - 0.7 (K/uL)    Basophils Relative 0  0 - 1 (%)    Basophils Absolute 0.0  0.0 - 0.1 (K/uL)   COMPREHENSIVE METABOLIC PANEL     Status: Abnormal   Collection Time   10/24/11  9:16 PM      Component Value Range Comment  Sodium 120 (*) 135 - 145 (mEq/L)    Potassium 3.9  3.5 - 5.1 (mEq/L)    Chloride 87 (*) 96 - 112 (mEq/L)    CO2 23  19 - 32 (mEq/L)    Glucose, Bld 83  70 - 99 (mg/dL)    BUN 12  6 - 23 (mg/dL)    Creatinine, Ser 5.78  0.50 - 1.10 (mg/dL)    Calcium 9.5  8.4 - 10.5 (mg/dL)    Total Protein 7.4  6.0 - 8.3 (g/dL)    Albumin 4.0  3.5 - 5.2 (g/dL)    AST 29  0 - 37 (U/L)    ALT 15  0 - 35 (U/L)    Alkaline Phosphatase 49  39 - 117 (U/L)    Total Bilirubin 0.4  0.3 - 1.2 (mg/dL)    GFR calc non Af Amer 88 (*) >90 (mL/min)    GFR calc Af Amer >90  >90 (mL/min)   URINALYSIS, ROUTINE W REFLEX MICROSCOPIC     Status: Abnormal   Collection Time   10/24/11 11:13 PM      Component Value Range Comment   Color, Urine YELLOW  YELLOW     APPearance CLOUDY (*) CLEAR     Specific Gravity, Urine 1.026  1.005 - 1.030     pH 6.0  5.0 - 8.0     Glucose, UA NEGATIVE  NEGATIVE (mg/dL)    Hgb urine dipstick NEGATIVE  NEGATIVE     Bilirubin Urine NEGATIVE  NEGATIVE      Ketones, ur TRACE (*) NEGATIVE (mg/dL)    Protein, ur NEGATIVE  NEGATIVE (mg/dL)    Urobilinogen, UA 0.2  0.0 - 1.0 (mg/dL)    Nitrite NEGATIVE  NEGATIVE     Leukocytes, UA SMALL (*) NEGATIVE    URINE MICROSCOPIC-ADD ON     Status: Abnormal   Collection Time   10/24/11 11:13 PM      Component Value Range Comment   Squamous Epithelial / LPF FEW (*) RARE     WBC, UA 0-2  <3 (WBC/hpf)    Bacteria, UA RARE  RARE     Crystals CA OXALATE CRYSTALS (*) NEGATIVE     Urine-Other MUCOUS PRESENT     CBC     Status: Abnormal   Collection Time   10/25/11  6:00 AM      Component Value Range Comment   WBC 2.9 (*) 4.0 - 10.5 (K/uL)    RBC 3.66 (*) 3.87 - 5.11 (MIL/uL)    Hemoglobin 11.7 (*) 12.0 - 15.0 (g/dL)    HCT 46.9 (*) 62.9 - 46.0 (%)    MCV 89.1  78.0 - 100.0 (fL)    MCH 32.0  26.0 - 34.0 (pg)    MCHC 35.9  30.0 - 36.0 (g/dL)    RDW 52.8  41.3 - 24.4 (%)    Platelets 256  150 - 400 (K/uL)    Ct Head Wo Contrast  10/24/2011  *RADIOLOGY REPORT*  Clinical Data:  Neck pain, fall, dizziness, history hypertension, rheumatoid arthritis  CT HEAD WITHOUT CONTRAST CT CERVICAL SPINE WITHOUT CONTRAST  Technique:  Multidetector CT imaging of the head and cervical spine was performed following the standard protocol without intravenous contrast.  Multiplanar CT image reconstructions of the cervical spine were also generated.  Comparison:  CT head 11/12/2010, CT cervical spine 07/27/2010  CT HEAD  Findings: Generalized atrophy. Stable ventricular morphology. No midline shift or mass effect. Tip of odontoid process and foramen  magnum. No intracranial hemorrhage, mass lesion, or evidence of acute infarction. No definite extra-axial fluid collections. Visualized paranasal sinuses and mastoid air cells clear. Calvaria intact.  IMPRESSION: No acute intracranial abnormalities, see below  CT CERVICAL SPINE  Findings: Visualized skull base intact. Chronic malalignment of C1-C2 with posterior subluxation of C2 with respect  C1. Predental space measures 10 mm AP. Posterior displacement of C2 places the tip of the odontoid process within the foramen magnum and causes marked AP narrowing of the canal, with residual AP diameter of  5.3 mm between the posterior margin of the odontoid and the anterior margin of the posterior arch of C1. This is likely causing marked compression of the cervicomedullary junction. However this finding is not significantly changed since the prior exam.  Prevertebral soft tissues are normal appearance. Bones appear demineralized. Vertebral body heights maintained without fracture or additional subluxation. Significant degenerative changes of the C1-C2 articulations are identified, greater on the right. Biapical lung scarring identified.  Osseous demineralization.  IMPRESSION: Chronic retrolisthesis of C2 versus C1 with marked widening of the predental space to 10 mm AP and marked narrowing of the spinal canal to 5.3 mm AP diameter between the odontoid process and posterior arch C1. This is likely causing marked compression of the cervicomedullary junction but is not significantly changed since previous study of 07/27/2010. No new osseous findings identified.  Original Report Authenticated By: Lollie Marrow, M.D.   Ct Cervical Spine Wo Contrast  10/24/2011  *RADIOLOGY REPORT*  Clinical Data:  Neck pain, fall, dizziness, history hypertension, rheumatoid arthritis  CT HEAD WITHOUT CONTRAST CT CERVICAL SPINE WITHOUT CONTRAST  Technique:  Multidetector CT imaging of the head and cervical spine was performed following the standard protocol without intravenous contrast.  Multiplanar CT image reconstructions of the cervical spine were also generated.  Comparison:  CT head 11/12/2010, CT cervical spine 07/27/2010  CT HEAD  Findings: Generalized atrophy. Stable ventricular morphology. No midline shift or mass effect. Tip of odontoid process and foramen magnum. No intracranial hemorrhage, mass lesion, or evidence of  acute infarction. No definite extra-axial fluid collections. Visualized paranasal sinuses and mastoid air cells clear. Calvaria intact.  IMPRESSION: No acute intracranial abnormalities, see below  CT CERVICAL SPINE  Findings: Visualized skull base intact. Chronic malalignment of C1-C2 with posterior subluxation of C2 with respect C1. Predental space measures 10 mm AP. Posterior displacement of C2 places the tip of the odontoid process within the foramen magnum and causes marked AP narrowing of the canal, with residual AP diameter of  5.3 mm between the posterior margin of the odontoid and the anterior margin of the posterior arch of C1. This is likely causing marked compression of the cervicomedullary junction. However this finding is not significantly changed since the prior exam.  Prevertebral soft tissues are normal appearance. Bones appear demineralized. Vertebral body heights maintained without fracture or additional subluxation. Significant degenerative changes of the C1-C2 articulations are identified, greater on the right. Biapical lung scarring identified.  Osseous demineralization.  IMPRESSION: Chronic retrolisthesis of C2 versus C1 with marked widening of the predental space to 10 mm AP and marked narrowing of the spinal canal to 5.3 mm AP diameter between the odontoid process and posterior arch C1. This is likely causing marked compression of the cervicomedullary junction but is not significantly changed since previous study of 07/27/2010. No new osseous findings identified.  Original Report Authenticated By: Lollie Marrow, M.D.   Assessment/Plan: 78 years old woman with first life time generalized tonic clonic seizure  that started as a focal seizure, but then had a generalized spread. 1) MRI brain 2) EEG 3) We have loaded the patient with Dilantin and level is pending 4) In this patient with history of dementia, I would lean towards continuing seizure treatment indefinitely due to increased  seizure risk even with normal MRI and EEG  Shamir Tuzzolino 10/25/2011, 6:40 AM

## 2011-10-26 DIAGNOSIS — W19XXXA Unspecified fall, initial encounter: Secondary | ICD-10-CM

## 2011-10-26 DIAGNOSIS — E871 Hypo-osmolality and hyponatremia: Secondary | ICD-10-CM

## 2011-10-26 DIAGNOSIS — R569 Unspecified convulsions: Secondary | ICD-10-CM

## 2011-10-26 LAB — COMPREHENSIVE METABOLIC PANEL
Albumin: 3.2 g/dL — ABNORMAL LOW (ref 3.5–5.2)
Alkaline Phosphatase: 43 U/L (ref 39–117)
BUN: 8 mg/dL (ref 6–23)
CO2: 23 mEq/L (ref 19–32)
Chloride: 97 mEq/L (ref 96–112)
Creatinine, Ser: 0.49 mg/dL — ABNORMAL LOW (ref 0.50–1.10)
GFR calc Af Amer: >90 mL/min (ref >90)
GFR calc non Af Amer: >90 mL/min (ref >90)
Glucose, Bld: 74 mg/dL (ref 70–99)
Potassium: 3.6 mEq/L (ref 3.5–5.1)
Total Bilirubin: 0.3 mg/dL (ref 0.3–1.2)

## 2011-10-26 LAB — CBC
MCV: 91.6 fL (ref 78.0–100.0)
Platelets: 228 10*3/uL (ref 150–400)
RBC: 3.44 MIL/uL — ABNORMAL LOW (ref 3.87–5.11)
RDW: 14.4 % (ref 11.5–15.5)
WBC: 3 10*3/uL — ABNORMAL LOW (ref 4.0–10.5)

## 2011-10-26 LAB — BASIC METABOLIC PANEL
BUN: 8 mg/dL (ref 6–23)
CO2: 21 mEq/L (ref 19–32)
Chloride: 95 mEq/L — ABNORMAL LOW (ref 96–112)
Chloride: 94 mEq/L — ABNORMAL LOW (ref 96–112)
Creatinine, Ser: 0.6 mg/dL (ref 0.50–1.10)
GFR calc Af Amer: >90 mL/min (ref >90)
GFR calc Af Amer: >90 mL/min (ref >90)
GFR calc non Af Amer: >90 mL/min (ref >90)
Potassium: 3.6 mEq/L (ref 3.5–5.1)
Sodium: 127 mEq/L — ABNORMAL LOW (ref 135–145)
Sodium: 126 mEq/L — ABNORMAL LOW (ref 135–145)

## 2011-10-26 LAB — OSMOLALITY: Osmolality: 260 mOsm/kg — ABNORMAL LOW (ref 275–300)

## 2011-10-26 NOTE — Progress Notes (Signed)
CARE MANAGEMENT NOTE 10/26/2011  Patient:  [name], [name]   Account Number:  [id number]  Date Initiated:  10/26/2011  Documentation initiated by:  Wafa Martes  Subjective/Objective Assessment:   patietn admitted due to progressive weakness, confusion and fall, na found to be 120 and was started on .3%NACL2     Action/Plan:   Lives at home alone, has children nearby for support/Son is concerned about recent medication change and her NA level. States this what happens when her medication is readjusted.   Anticipated DC Date:  10/29/2011   Anticipated DC Plan:  HOME/SELF CARE  In-house referral  NA      DC Planning Services  NA      [facility] Choice  NA   Choice offered to / List presented to:  NA   DME arranged  NA      DME agency  NA     HH arranged  NA      HH agency  NA   Status of service:  In process, will continue to follow Medicare Important Message given?  NA - LOS <3 / Initial given by admissions (If response is "NO", the following Medicare IM given date fields will be blank) Date Medicare IM given:   Date Additional Medicare IM given:    Discharge Disposition:    Per UR Regulation:  Reviewed for med. necessity/level of care/duration of stay  If discussed at Long Length of Stay Meetings, dates discussed:    Comments:  [id number]/Deven Furia Earlene Plater, RN, BSN, CCM No discharge needs present at time of this review at the sdu/icu level. Case Management [id number]

## 2011-10-26 NOTE — Evaluation (Signed)
Physical Therapy Evaluation Patient Details Name: [name] MRN: [id number] DOB: 1930/03/18 Today's Date: 10/26/2011 Time: 6295-2841 PT Time Calculation (min): 21 min  PT Assessment / Plan / Recommendation Clinical Impression  Pt is 78 yo female with hyponatremia, falls, and past h/o RA who would benefit from acute PT to address her unsafe mobility OOB, generalized weakness, and decreased independence.  Recommend SNF for rehab, at this point, pt agreeable to this.  PT to follow.    PT Assessment  Patient needs continued PT services    Follow Up Recommendations  Skilled nursing facility;Supervision/Assistance - 24 hour    Equipment Recommendations  Defer to next venue    Frequency Min 3X/week    Precautions / Restrictions Precautions Precautions: Fall Restrictions Weight Bearing Restrictions: No   Pertinent Vitals/Pain VSS 8/10 left sided head pain      Mobility  Bed Mobility Bed Mobility: Supine to Sit Supine to Sit: HOB elevated;5: Supervision (30deg) Details for Bed Mobility Assistance: pt performs bed mobility slowly but without physical assist Transfers Transfers: Sit to Stand;Stand to Sit Sit to Stand: 4: Min guard;From bed;With upper extremity assist Stand to Sit: 3: Mod assist;With upper extremity assist;To chair/3-in-1 Details for Transfer Assistance: pt unable to rise without physical assist, HHA to left hand and hand at pelvis for trunk extension Ambulation/Gait Ambulation/Gait Assistance: 1: +2 Total assist Ambulation/Gait: Patient Percentage: 50 Ambulation Distance (Feet): 12 Feet Assistive device: Straight cane Ambulation/Gait Assistance Details: +2 assist to support pt, right lean with right knee flexed, fwd flexed posture.  Pt began stepping feet on her own but then became very anxious and needed vc's for each step the rest of the way to chair and was actually resisting fwd motion.  She did better when therapists were on each side and she became  anxious when therapist was behind her with hands on hips and she could not see me. Gait Pattern: Step-to pattern;Shuffle;Decreased weight shift to left;Lateral trunk lean to right;Trunk flexed Gait velocity: very slow Stairs: No Wheelchair Mobility Wheelchair Mobility: No         PT Goals Acute Rehab PT Goals PT Goal Formulation: With patient Time For Goal Achievement: 11/09/11 Potential to Achieve Goals: Fair Pt will go Supine/Side to Sit: with supervision;with HOB 0 degrees PT Goal: Supine/Side to Sit - Progress: Goal set today Pt will Sit at Edge of Bed: 3-5 min;with no upper extremity support;with modified independence PT Goal: Sit at Edge Of Bed - Progress: Goal set today Pt will go Sit to Supine/Side: with supervision;with HOB 0 degrees PT Goal: Sit to Supine/Side - Progress: Goal set today Pt will go Sit to Stand: with supervision PT Goal: Sit to Stand - Progress: Goal set today Pt will go Stand to Sit: with min assist PT Goal: Stand to Sit - Progress: Goal set today Pt will Stand: 1 - 2 min;with supervision;with unilateral upper extremity support PT Goal: Stand - Progress: Goal set today Pt will Ambulate: 51 - 150 feet;with min assist;with least restrictive assistive device PT Goal: Ambulate - Progress: Goal set today Pt will Perform Home Exercise Program: with supervision, verbal cues required/provided PT Goal: Perform Home Exercise Program - Progress: Goal set today  Visit Information  Last PT Received On: 10/26/11 Assistance Needed: +2    Subjective Data  Subjective: "they tell me I was ugly yesterday and I'm so embarrassed.  That's not in my nature" Patient Stated Goal: to be able to get up and walk, to return home  Prior Functioning  Home Living Lives With: Alone Available Help at Discharge: Family (children nearby) Type of Home: House Home Layout: Two level;Able to live on main level with bedroom/bathroom Home Adaptive Equipment: Straight cane;Walker -  rolling Prior Function Level of Independence: Independent with assistive device(s) Able to Take Stairs?: Yes Driving: No Vocation: Unemployed Comments: pt cares for self but has had multiple falls and was for similar problems recently Communication Communication: No difficulties    Cognition  Overall Cognitive Status: History of cognitive impairments - further impaired Area of Impairment: Memory;Following commands;Safety/judgement;Awareness of errors;Awareness of deficits;Problem solving Arousal/Alertness: Other (comment) (awake but not fully alert) Orientation Level: Oriented X4 / Intact Behavior During Session: Anxious Memory: Decreased recall of precautions Memory Deficits: recall of events from yesterday not present Following Commands: Follows one step commands inconsistently;Follows multi-step commands inconsistently Safety/Judgement: Decreased awareness of safety precautions;Decreased safety judgement for tasks assessed;Decreased awareness of need for assistance Safety/Judgement - Other Comments: pt grabbing for objects and not realizing that she was not standing up all the way.  Also having motor planning issues during ambulation. Awareness of Errors: Assistance required to identify errors made;Assistance required to correct errors made Awareness of Deficits: pt kept right knee bent throughout sit to stand and ambulation but she was not aware of it and could not explain why, she did not report pain, and she could straighten it with cues Problem Solving: could not figure out how to get herself into chair Cognition - Other Comments: pt kept one eye closed throughout session and she was aware of this but couldn't explain why    Extremity/Trunk Assessment Right Upper Extremity Assessment RUE ROM/Strength/Tone: Deficits RUE ROM/Strength/Tone Deficits: deficits due to RA, seems to be at baseline with UE's Left Upper Extremity Assessment LUE ROM/Strength/Tone: Deficits LUE  ROM/Strength/Tone Deficits: see RUE note Right Lower Extremity Assessment RLE ROM/Strength/Tone: Deficits;Unable to fully assess RLE ROM/Strength/Tone Deficits: could not fully assess due to pt not following all commands, noted weakness with transfers and ambulation, decreased quad strength, hip flex 3/5 (noted swelling right knee) RLE Sensation: Deficits RLE Sensation Deficits: poor proprioception RLE Coordination: Deficits RLE Coordination Deficits: poor coordination of mvmt with ambulation Left Lower Extremity Assessment LLE ROM/Strength/Tone: Deficits;Unable to fully assess LLE ROM/Strength/Tone Deficits: unable to fully assess due to cognition but deficits noted with sit to stand and ambulation LLE Sensation: Deficits LLE Sensation Deficits: poor proprioception LLE Coordination: Deficits LLE Coordination Deficits: poor LE coordination with gait Trunk Assessment Trunk Assessment: Kyphotic   Balance Balance Balance Assessed: Yes Static Sitting Balance Static Sitting - Balance Support: Bilateral upper extremity supported;Feet supported Static Sitting - Level of Assistance: 3: Mod assist Static Sitting - Comment/# of Minutes: 5/ posterior lean, able to sit with as little as min-guard but only for about 20 sed before leaning back again Static Standing Balance Static Standing - Balance Support: Bilateral upper extremity supported;During functional activity Static Standing - Level of Assistance: 1: +2 Total assist;Patient percentage (comment) (50%)  End of Session PT - End of Session Equipment Utilized During Treatment: Gait belt Activity Tolerance: Treatment limited secondary to agitation Patient left: in chair;with call bell/phone within reach Nurse Communication: Mobility status   Jireh Elmore, [place] 10/26/2011, 3:44 PM  Lyanne Co, PT  Acute [employer]  [phone]

## 2011-10-26 NOTE — Progress Notes (Signed)
VSS, more alert/oriented this am. Dr Cena Benton in to eval pt this am. Na 129 with am labs, serum osmo pending.

## 2011-10-26 NOTE — Progress Notes (Signed)
Subjective: Patient alert and responds to questions.  States that she fell yesterday.  No acute issues reported.  This am her blood pressure was decreased and on repeat and taken properly was 135/68.  Objective: Filed Vitals:   10/26/11 0300 10/26/11 0446 10/26/11 0500 10/26/11 0700  BP: 92/54 132/63 124/65 90/47  Pulse: 57 66 60 59  Temp:  98.7 F (37.1 C)    TempSrc:  Oral    Resp: 12 15 12 12   Height:      Weight:      SpO2: 100% 98% 99% 100%   Weight change: -5.9 kg (-13 lb 0.1 oz)  Intake/Output Summary (Last 24 hours) at 10/26/11 0814 Last data filed at 10/26/11 0600  Gross per 24 hour  Intake    958 ml  Output    150 ml  Net    808 ml    General: Alert, awake, oriented x 3, in no acute distress.  HEENT: No bruits, no goiter.  Heart: Regular rate and rhythm, without murmurs, rubs, gallops.  Lungs: Clear to auscultation, no wheezes Abdomen: Soft, nontender, nondistended, positive bowel sounds.  Neuro: Grossly intact, nonfocal.   Lab Results:  [facility] 10/26/11 0331 10/25/11 1827  NA 129* 126*  K 3.6 3.7  CL 97 93*  CO2 23 24  GLUCOSE 74 104*  BUN 8 6  CREATININE 0.49* 0.49*  CALCIUM 8.8 8.7  MG -- --  PHOS -- --    Basename 10/26/11 0331 10/24/11 2116  AST 24 29  ALT 12 15  ALKPHOS 43 49  BILITOT 0.3 0.4  PROT 6.3 7.4  ALBUMIN 3.2* 4.0   No results found for this basename: LIPASE:2,AMYLASE:2 in the last 72 hours  Basename 10/26/11 0331 10/25/11 0600 10/24/11 2116  WBC 3.0* 2.9* --  NEUTROABS -- -- 2.4  HGB 11.0* 11.7* --  HCT 31.5* 32.6* --  MCV 91.6 89.1 --  PLT 228 256 --   No results found for this basename: CKTOTAL:3,CKMB:3,CKMBINDEX:3,TROPONINI:3 in the last 72 hours No components found with this basename: POCBNP:3 No results found for this basename: DDIMER:2 in the last 72 hours No results found for this basename: HGBA1C:2 in the last 72 hours No results found for this basename: CHOL:2,HDL:2,LDLCALC:2,TRIG:2,CHOLHDL:2,LDLDIRECT:2 in  the last 72 hours  Basename 10/25/11 0600  TSH 3.226  T4TOTAL --  T3FREE --  THYROIDAB --   No results found for this basename: VITAMINB12:2,FOLATE:2,FERRITIN:2,TIBC:2,IRON:2,RETICCTPCT:2 in the last 72 hours  Micro Results: Recent Results (from the past 240 hour(s))  MRSA PCR SCREENING     Status: Normal   Collection Time   10/25/11  4:54 AM      Component Value Range Status Comment   MRSA by PCR NEGATIVE  NEGATIVE  Final     Studies/Results: Ct Head Wo Contrast  10/24/2011  *RADIOLOGY REPORT*  Clinical Data:  Neck pain, fall, dizziness, history hypertension, rheumatoid arthritis  CT HEAD WITHOUT CONTRAST CT CERVICAL SPINE WITHOUT CONTRAST  Technique:  Multidetector CT imaging of the head and cervical spine was performed following the standard protocol without intravenous contrast.  Multiplanar CT image reconstructions of the cervical spine were also generated.  Comparison:  CT head 11/12/2010, CT cervical spine 07/27/2010  CT HEAD  Findings: Generalized atrophy. Stable ventricular morphology. No midline shift or mass effect. Tip of odontoid process and foramen magnum. No intracranial hemorrhage, mass lesion, or evidence of acute infarction. No definite extra-axial fluid collections. Visualized paranasal sinuses and mastoid air cells clear. Calvaria intact.  IMPRESSION: No  acute intracranial abnormalities, see below  CT CERVICAL SPINE  Findings: Visualized skull base intact. Chronic malalignment of C1-C2 with posterior subluxation of C2 with respect C1. Predental space measures 10 mm AP. Posterior displacement of C2 places the tip of the odontoid process within the foramen magnum and causes marked AP narrowing of the canal, with residual AP diameter of  5.3 mm between the posterior margin of the odontoid and the anterior margin of the posterior arch of C1. This is likely causing marked compression of the cervicomedullary junction. However this finding is not significantly changed since the prior  exam.  Prevertebral soft tissues are normal appearance. Bones appear demineralized. Vertebral body heights maintained without fracture or additional subluxation. Significant degenerative changes of the C1-C2 articulations are identified, greater on the right. Biapical lung scarring identified.  Osseous demineralization.  IMPRESSION: Chronic retrolisthesis of C2 versus C1 with marked widening of the predental space to 10 mm AP and marked narrowing of the spinal canal to 5.3 mm AP diameter between the odontoid process and posterior arch C1. This is likely causing marked compression of the cervicomedullary junction but is not significantly changed since previous study of 07/27/2010. No new osseous findings identified.  Original Report Authenticated By: Lollie Marrow, M.D.   Ct Cervical Spine Wo Contrast  10/24/2011  *RADIOLOGY REPORT*  Clinical Data:  Neck pain, fall, dizziness, history hypertension, rheumatoid arthritis  CT HEAD WITHOUT CONTRAST CT CERVICAL SPINE WITHOUT CONTRAST  Technique:  Multidetector CT imaging of the head and cervical spine was performed following the standard protocol without intravenous contrast.  Multiplanar CT image reconstructions of the cervical spine were also generated.  Comparison:  CT head 11/12/2010, CT cervical spine 07/27/2010  CT HEAD  Findings: Generalized atrophy. Stable ventricular morphology. No midline shift or mass effect. Tip of odontoid process and foramen magnum. No intracranial hemorrhage, mass lesion, or evidence of acute infarction. No definite extra-axial fluid collections. Visualized paranasal sinuses and mastoid air cells clear. Calvaria intact.  IMPRESSION: No acute intracranial abnormalities, see below  CT CERVICAL SPINE  Findings: Visualized skull base intact. Chronic malalignment of C1-C2 with posterior subluxation of C2 with respect C1. Predental space measures 10 mm AP. Posterior displacement of C2 places the tip of the odontoid process within the foramen  magnum and causes marked AP narrowing of the canal, with residual AP diameter of  5.3 mm between the posterior margin of the odontoid and the anterior margin of the posterior arch of C1. This is likely causing marked compression of the cervicomedullary junction. However this finding is not significantly changed since the prior exam.  Prevertebral soft tissues are normal appearance. Bones appear demineralized. Vertebral body heights maintained without fracture or additional subluxation. Significant degenerative changes of the C1-C2 articulations are identified, greater on the right. Biapical lung scarring identified.  Osseous demineralization.  IMPRESSION: Chronic retrolisthesis of C2 versus C1 with marked widening of the predental space to 10 mm AP and marked narrowing of the spinal canal to 5.3 mm AP diameter between the odontoid process and posterior arch C1. This is likely causing marked compression of the cervicomedullary junction but is not significantly changed since previous study of 07/27/2010. No new osseous findings identified.  Original Report Authenticated By: Lollie Marrow, M.D.   Mr Laqueta Jean ZO Contrast  10/25/2011  *RADIOLOGY REPORT*  Clinical Data: Confusion.  Possible seizure.  Prior fall. Dizziness.  High blood pressure.  MRI HEAD WITHOUT AND WITH CONTRAST  Technique:  Multiplanar, multiecho pulse sequences of  the brain and surrounding structures were obtained according to standard protocol without and with intravenous contrast  Contrast: 11mL MULTIHANCE GADOBENATE DIMEGLUMINE 529 MG/ML IV SOLN  Comparison: 10/24/2011 head CT.  No comparison MR.  Findings: Motion degraded exam.  No acute infarct.  Marked widening of the predental space with 13.7 mm gap.  The dens projects superiorly and posteriorly causing marked compression of the cervical medullary junction. Suggestion of increased signal within the compressed cervical medullary junction which may represent edema and / or gliosis.  No  intracranial hemorrhage.  Global atrophy without hydrocephalus.  Small vessel disease type changes.  No intracranial mass or abnormal enhancement.  No seizure focus detected.  No evidence of mesial temporal sclerosis.  Major intracranial vascular structures are patent.  IMPRESSION: Motion degraded exam.  No acute infarct.  Marked widening of the predental space with 13.7 mm gap.  The dens projects superiorly and posteriorly causing marked compression of the cervical medullary junction. Suggestion of increased signal within the compressed cervical medullary junction which may represent edema and / or gliosis.  Original Report Authenticated By: Fuller Canada, M.D.    Medications: I have reviewed the patient's current medications.   Patient Active Hospital Problem List: Hyponatremia (06/14/2011)  1) Patient is currently improving off of the methotrexate and with fluid restriction.  Currently suspecting SIADH as cause.  Etiology uncertain at this juncture.  Patient is currently KVO normal saline at 10cc/hr.  Will continue to monitor patient and given improvement in condition will transfer out of step down ICU to floor today.  Seizure:  Likely related to hyponatremia.  Neurology is following at this juncture and we will f/u with their recommendations.  HYPERLIPIDEMIA (06/26/2007) Stable will continue current regimen.  ANXIETY (06/26/2007) Stable will continue to monitor.  HYPERTENSION (06/26/2007) Patient is on metoprolol.  Her blood pressure while she was sleeping was low this am but on repeat was 135/68.  May continue her blood pressure medication.  Rheumatoid arthritis (06/26/2007) Patient is off of her methotrexate.  Son feels like this medication is causing more harm than good.  Will continue to hold for now.  Psychosis (06/14/2011) Resolved and may have been related to her hyponatremia.  Cervical subluxation (10/24/2011) Reportedly unchanged from one year ago.     LOS: 2 days    Penny Pia M.D.  [facility] 10/26/2011, 8:14 AM

## 2011-10-26 NOTE — Progress Notes (Signed)
Sodium level increasing, more alert/oriented in past 24 hours. Assisted up to chair max x 2, unsteady on feet. Appetite remains poor, but improving.

## 2011-10-27 DIAGNOSIS — R569 Unspecified convulsions: Secondary | ICD-10-CM

## 2011-10-27 DIAGNOSIS — W19XXXA Unspecified fall, initial encounter: Secondary | ICD-10-CM

## 2011-10-27 DIAGNOSIS — E871 Hypo-osmolality and hyponatremia: Secondary | ICD-10-CM

## 2011-10-27 LAB — BASIC METABOLIC PANEL
CO2: 23 mEq/L (ref 19–32)
CO2: 26 mEq/L (ref 19–32)
CO2: 25 mEq/L (ref 19–32)
Calcium: 8.9 mg/dL (ref 8.4–10.5)
Chloride: 96 mEq/L (ref 96–112)
Chloride: 94 mEq/L — ABNORMAL LOW (ref 96–112)
Chloride: 93 mEq/L — ABNORMAL LOW (ref 96–112)
GFR calc Af Amer: >90 mL/min (ref >90)
GFR calc Af Amer: >90 mL/min (ref >90)
GFR calc non Af Amer: 84 mL/min — ABNORMAL LOW (ref >90)
GFR calc non Af Amer: 85 mL/min — ABNORMAL LOW (ref >90)
Glucose, Bld: 100 mg/dL — ABNORMAL HIGH (ref 70–99)
Potassium: 4.1 mEq/L (ref 3.5–5.1)
Potassium: 3.8 mEq/L (ref 3.5–5.1)
Potassium: 4.1 mEq/L (ref 3.5–5.1)
Sodium: 127 mEq/L — ABNORMAL LOW (ref 135–145)
Sodium: 128 mEq/L — ABNORMAL LOW (ref 135–145)
Sodium: 126 mEq/L — ABNORMAL LOW (ref 135–145)

## 2011-10-27 MED ORDER — SODIUM CHLORIDE 1 G PO TABS
1.0000 g | ORAL_TABLET | Freq: Two times a day (BID) | ORAL | Status: DC
  Administered 2011-10-27 – 2011-10-28 (×3): 1 g via ORAL
  Filled 2011-10-27 (×8): qty 1

## 2011-10-27 NOTE — Progress Notes (Signed)
Clinical Social Work Department CLINICAL SOCIAL WORK PLACEMENT NOTE 10/27/2011  Patient:  [name], [name]  Account Number:  [id number] Admit date:  10/24/2011  Clinical Social Worker:  Doroteo Glassman  Date/time:  10/27/2011 12:00 M  Clinical Social Work is seeking post-discharge placement for this patient at the following level of care:   SKILLED NURSING   (*CSW will update this form in Epic as items are completed)   10/27/2011  Patient/family provided with [facility] Department of Clinical Social Work's list of facilities offering this level of care within the geographic area requested by the patient (or if unable, by the patient's family).  10/27/2011  Patient/family informed of their freedom to choose among providers that offer the needed level of care, that participate in Medicare, Medicaid or managed care program needed by the patient, have an available bed and are willing to accept the patient.  10/27/2011  Patient/family informed of [facility]' ownership interest in [facility], as well as of the fact that they are under no obligation to receive care at this facility.  PASARR submitted to [employer]DS on 10/27/2011 PASARR number received from [employer] on   FL2 transmitted to all facilities in geographic area requested by pt/family on  10/27/2011 FL2 transmitted to all facilities within larger geographic area on   Patient informed that his/her managed care company has contracts with or will negotiate with  certain facilities, including the following:     Patient/family informed of bed offers received:   Patient chooses bed at  Physician recommends and patient chooses bed at    Patient to be transferred to  on   Patient to be transferred to facility by   The following physician request were entered in Epic:   Additional Comments:

## 2011-10-27 NOTE — Progress Notes (Signed)
Clinical Social Work Department BRIEF PSYCHOSOCIAL ASSESSMENT 10/27/2011  Patient:  [name], [name]     Account Number:  [id number]     Admit date:  10/24/2011  Clinical Social Worker:  Doroteo Glassman  Date/Time:  10/27/2011 12:00 M  Referred by:  Physician  Date Referred:  10/27/2011 Referred for  SNF Placement   Other Referral:   Interview type:  Other - See comment Other interview type:   Son, [name], at bedside    PSYCHOSOCIAL DATA Living Status:  ALONE Admitted from facility:   Level of care:   Primary support name:  [name] Primary support relationship to patient:  CHILD, ADULT Degree of support available:   Adequate    CURRENT CONCERNS Current Concerns  Post-Acute Placement   Other Concerns:    SOCIAL WORK ASSESSMENT / PLAN CSW obtained permission to fax Pt's information to [place] facilities.  CSW to meet with [name] tomorrow at 11 to discuss offers.   Assessment/plan status:  Information/Referral to [facility] Other assessment/ plan:   Information/referral to community resources:    PATIENT'S/FAMILY'S RESPONSE TO PLAN OF CARE: [name] and Pt thanked CSW for the information provided.

## 2011-10-27 NOTE — Progress Notes (Signed)
Subjective: Spoke with patient and son concerning placement after hospitalization.  Patient feels like she does not want to go to SNF and has major concerns.  I have indicated that this would be a transient measure and that it does not mean that she will permanently stay there.  Son is in agreement that patient should transition to SNF once discharged.    Otherwise no acute issues overnight.   Objective: Filed Vitals:   10/27/11 0555 10/27/11 1016 10/27/11 1325 10/27/11 1354  BP: 90/48 99/42 114/73 101/63  Pulse: 72 45 76 79  Temp: 98.7 F (37.1 C)  98 F (36.7 C) 98 F (36.7 C)  TempSrc: Oral  Oral Oral  Resp: 18  18 16   Height:      Weight:      SpO2: 100%  100% 100%   Weight change: 5.446 kg (12 lb 0.1 oz)  Intake/Output Summary (Last 24 hours) at 10/27/11 1746 Last data filed at 10/27/11 1435  Gross per 24 hour  Intake    240 ml  Output    200 ml  Net     40 ml    General: Alert, awake, oriented x 3, in no acute distress.  HEENT: No bruits, no goiter.  Heart: Regular rate and rhythm, without murmurs, rubs, gallops.  Lungs: Clear to auscultation, no wheezes  Abdomen: Soft, nontender, nondistended, positive bowel sounds.  Neuro: Grossly intact, nonfocal.   Lab Results:  Basename 10/27/11 1425 10/27/11 0915  NA 127* 128*  K 4.1 3.8  CL 94* 96  CO2 26 24  GLUCOSE 105* 100*  BUN 17 14  CREATININE 0.61 0.58  CALCIUM 8.3* 8.8  MG -- --  PHOS -- --    Basename 10/26/11 0331 10/24/11 2116  AST 24 29  ALT 12 15  ALKPHOS 43 49  BILITOT 0.3 0.4  PROT 6.3 7.4  ALBUMIN 3.2* 4.0   No results found for this basename: LIPASE:2,AMYLASE:2 in the last 72 hours  Basename 10/26/11 0331 10/25/11 0600 10/24/11 2116  WBC 3.0* 2.9* --  NEUTROABS -- -- 2.4  HGB 11.0* 11.7* --  HCT 31.5* 32.6* --  MCV 91.6 89.1 --  PLT 228 256 --   No results found for this basename: CKTOTAL:3,CKMB:3,CKMBINDEX:3,TROPONINI:3 in the last 72 hours No components found with this basename:  POCBNP:3 No results found for this basename: DDIMER:2 in the last 72 hours No results found for this basename: HGBA1C:2 in the last 72 hours No results found for this basename: CHOL:2,HDL:2,LDLCALC:2,TRIG:2,CHOLHDL:2,LDLDIRECT:2 in the last 72 hours  Basename 10/25/11 0600  TSH 3.226  T4TOTAL --  T3FREE --  THYROIDAB --   No results found for this basename: VITAMINB12:2,FOLATE:2,FERRITIN:2,TIBC:2,IRON:2,RETICCTPCT:2 in the last 72 hours  Micro Results: Recent Results (from the past 240 hour(s))  MRSA PCR SCREENING     Status: Normal   Collection Time   10/25/11  4:54 AM      Component Value Range Status Comment   MRSA by PCR NEGATIVE  NEGATIVE  Final     Studies/Results: No results found.  Medications: I have reviewed the patient's current medications.   Hyponatremia (06/14/2011)   1) Patient is currently improving off of the methotrexate and with fluid restriction. Currently suspecting SIADH as cause. Etiology uncertain at this juncture. Patient is currently KVO normal saline at 10cc/hr.   I have also fluid restricted patient and will administer oral salt tablets.  Seizure: Likely related to hyponatremia. Neurology is following at this juncture and we will f/u with  their recommendations. Also would appreciate disposition plans for this particular patient from a neurological standpoint.  HYPERLIPIDEMIA (06/26/2007) Stable will continue current regimen.   ANXIETY (06/26/2007) Stable will continue to monitor.   HYPERTENSION (06/26/2007) Patient is on metoprolol. Her blood pressure while she was sleeping was low this am but on repeat was 135/68. May continue her blood pressure medication.   Rheumatoid arthritis (06/26/2007) Patient is off of her methotrexate. Son feels like this medication is causing more harm than good. Will continue to hold for now.   Psychosis (06/14/2011) Resolved and may have been related to her hyponatremia.   Cervical subluxation  (10/24/2011) Reportedly unchanged from one year ago.   Disposition:  Have discussed at length with patient and son.  Plan would be to discharge to SNF tomorrow or home should the patient decide not to go to SNF as she seems competent to make her own decisions at this juncture.  Patient will decide tomorrow.  Would also discharge on antiepileptic medication of choice from neuro.    LOS: 3 days   Penny Pia M.D.  [facility] 10/27/2011, 5:46 PM

## 2011-10-28 DIAGNOSIS — R569 Unspecified convulsions: Secondary | ICD-10-CM

## 2011-10-28 DIAGNOSIS — G40309 Generalized idiopathic epilepsy and epileptic syndromes, not intractable, without status epilepticus: Secondary | ICD-10-CM

## 2011-10-28 DIAGNOSIS — W19XXXA Unspecified fall, initial encounter: Secondary | ICD-10-CM

## 2011-10-28 DIAGNOSIS — E871 Hypo-osmolality and hyponatremia: Secondary | ICD-10-CM

## 2011-10-28 LAB — BASIC METABOLIC PANEL
BUN: 11 mg/dL (ref 6–23)
CO2: 25 mEq/L (ref 19–32)
Chloride: 95 mEq/L — ABNORMAL LOW (ref 96–112)
Creatinine, Ser: 0.52 mg/dL (ref 0.50–1.10)
Glucose, Bld: 80 mg/dL (ref 70–99)

## 2011-10-28 LAB — CBC
HCT: 28.4 % — ABNORMAL LOW (ref 36.0–46.0)
Hemoglobin: 9.8 g/dL — ABNORMAL LOW (ref 12.0–15.0)
MCV: 92.2 fL (ref 78.0–100.0)
Platelets: 214 10*3/uL (ref 150–400)
RBC: 3.08 MIL/uL — ABNORMAL LOW (ref 3.87–5.11)
WBC: 4 10*3/uL (ref 4.0–10.5)

## 2011-10-28 LAB — PHENYTOIN LEVEL, TOTAL: Phenytoin Lvl: 23.5 ug/mL — ABNORMAL HIGH (ref 10.0–20.0)

## 2011-10-28 MED ORDER — PHENYTOIN SODIUM EXTENDED 100 MG PO CAPS
200.0000 mg | ORAL_CAPSULE | Freq: Every day | ORAL | Status: DC
  Filled 2011-10-28: qty 2

## 2011-10-28 MED ORDER — PHENYTOIN SODIUM EXTENDED 100 MG PO CAPS: 300.0000 mg | ORAL_CAPSULE | Freq: Every day | ORAL | Status: DC

## 2011-10-28 MED ORDER — PHENYTOIN SODIUM EXTENDED 100 MG PO CAPS: 200.0000 mg | ORAL_CAPSULE | Freq: Every day | ORAL | Status: DC

## 2011-10-28 MED ORDER — SODIUM CHLORIDE 1 G PO TABS: 1.0000 g | ORAL_TABLET | Freq: Two times a day (BID) | ORAL | Status: DC

## 2011-10-28 MED ORDER — PHENYTOIN SODIUM EXTENDED 200 MG PO CAPS: 200.0000 mg | ORAL_CAPSULE | Freq: Every day | ORAL | Status: DC

## 2011-10-28 NOTE — Progress Notes (Signed)
Called report to [facility] RN.  All belongings with patient.  No changes since am assessment.  Copied chart with patient.  IV removed from right forearm.

## 2011-10-28 NOTE — Progress Notes (Signed)
MEDICATION RELATED CONSULT NOTE - INITIAL   Pharmacy Consult for Phenytoin Indication: Seizures on Admission  Patient Measurements: Height: 5\' 2"  (157.5 cm) Weight: 124 lb (56.246 kg) IBW/kg (Calculated) : 50.1   Vital Signs: Temp: 98.5 F (36.9 C) (05/03 0659) Temp src: Oral (05/03 0659) BP: 125/69 mmHg (05/03 0659) Pulse Rate: 72  (05/03 0659)  Labs:  Basename 10/28/11 0520 10/27/11 1754 10/27/11 1425 10/26/11 0331  WBC 4.0 -- -- 3.0*  HGB 9.8* -- -- 11.0*  HCT 28.4* -- -- 31.5*  PLT 214 -- -- 228  APTT -- -- -- --  CREATININE 0.52 0.57 0.61 --  LABCREA -- -- -- --  CREATININE 0.52 0.57 0.61 --  CREAT24HRUR -- -- -- --  MG -- -- -- --  PHOS -- -- -- --  ALBUMIN 2.9* -- -- 3.2*  PROT -- -- -- 6.3  ALBUMIN 2.9* -- -- 3.2*  AST -- -- -- 24  ALT -- -- -- 12  ALKPHOS -- -- -- 43  BILITOT -- -- -- 0.3  BILIDIR -- -- -- --  IBILI -- -- -- --   Assessment:  31 YOF admitted with hyponatremia and seizures.  Sodium levels slowly resolving.  Patient was loaded with phenytoin 4/30 and started on maintenance dose of phenytoin ER capsule 100 mg TID.  Today's phenytoin level = 34.6 (corrected for albumin 2.9).  This level is considered toxic and neuro c/s noted patient was positive for nystagmus today (2/2 elevated phenytoin level).  CrCl~45 mL/min and stable.  No further seizures reported.  Goal of Therapy:  Phenytoin level 10-20 mcg/mL  Plan:   Phenytoin ER capsule 200 mg po qhs.  Once daily dosing is appropriate for the ER formulation in this patient with CrCl~45 ml/min.  Recheck phenytoin level early next week.  Clance Boll 10/28/2011,10:14 AM

## 2011-10-28 NOTE — Progress Notes (Signed)
Spoke with Pt's son via phone.    Pt's son stating that, since [facility] is closest to his home, he'd like for Pt to go there.    Channing Mutters indicated that he was near the facility, at this time, and CSW encouraged him to go and tour.  Contacted Melissa, CSW at [facility].  Melissa indicated that they had a bed available for Pt and asked CSW to notify her when MD feels d/c is imminent.  CSW to continue to follow.  Providence Crosby, LCSWA Clinical Social Work [phone]

## 2011-10-28 NOTE — Progress Notes (Signed)
[facility] PROGRESS NOTE    SUBJECTIVE   Patient has no complaints.  When asked if she has diplopia she states "no" but will shut one eye when looking at me.  After talking she admitted she has vertical diplopia. Positive nystagmus seen when she looks at me.   OBJECTIVE   Vital signs in last 24 hours: Temp:  [97.9 F (36.6 C)-98.5 F (36.9 C)] 98.5 F (36.9 C) (05/03 0659) Pulse Rate:  [45-79] 72  (05/03 0659) Resp:  [16-18] 18  (05/03 0659) BP: (99-125)/(42-73) 125/69 mmHg (05/03 0659) SpO2:  [98 %-100 %] 100 % (05/03 0659)  Intake/Output from previous day: 05/02 0701 - 05/03 0700 In: 240 [P.O.:240] Out: 525 [Urine:525] Intake/Output this shift: Total I/O In: -  Out: 125 [Urine:125] Nutritional status: Cardiac  Past Medical History  Diagnosis Date  . Hypertension   . Dysrhythmia   . Arthritis   . Fall 06/01/11    twice this year  . Incontinence of urine     wears depends    Neurologic Exam:   Mental Status: Alert, oriented, thought content appropriate.  Speech fluent without evidence of aphasia. Able to follow 3 step commands without difficulty. Cranial Nerves: II-Visual fields grossly intact. Complains of diplopia III/IV/VI-Extraocular movements intact.  Pupils reactive bilaterally. Positive nystagmus when looking forward.  V/VII-Smile symmetric VIII-grossly intact IX/X-normal gag XI-bilateral shoulder shrug XII-midline tongue extension Motor: 5/5 bilaterally with normal tone and bulk, positive arthritic changes to fingers making grip weak Sensory: Pinprick and light touch intact throughout, bilaterally Deep Tendon Reflexes: 1+ and symmetric throughout Plantars: mutebilaterally Cerebellar: Normal finger-to-nose, normal heel-to-shin test.    Lab Results: Results for orders placed during the hospital encounter of 10/24/11 (from the past 24 hour(s))  BASIC METABOLIC PANEL     Status: Abnormal   Collection  Time   10/27/11  2:25 PM      Component Value Range   Sodium 127 (*) 135 - 145 (mEq/L)   Potassium 4.1  3.5 - 5.1 (mEq/L)   Chloride 94 (*) 96 - 112 (mEq/L)   CO2 26  19 - 32 (mEq/L)   Glucose, Bld 105 (*) 70 - 99 (mg/dL)   BUN 17  6 - 23 (mg/dL)   Creatinine, Ser 1.61  0.50 - 1.10 (mg/dL)   Calcium 8.3 (*) 8.4 - 10.5 (mg/dL)   GFR calc non Af Amer 84 (*) >90 (mL/min)   GFR calc Af Amer >90  >90 (mL/min)  BASIC METABOLIC PANEL     Status: Abnormal   Collection Time   10/27/11  5:54 PM      Component Value Range   Sodium 126 (*) 135 - 145 (mEq/L)   Potassium 4.1  3.5 - 5.1 (mEq/L)   Chloride 93 (*) 96 - 112 (mEq/L)   CO2 25  19 - 32 (mEq/L)   Glucose, Bld 131 (*) 70 - 99 (mg/dL)   BUN 18  6 - 23 (mg/dL)   Creatinine, Ser 0.96  0.50 - 1.10 (mg/dL)   Calcium 8.4  8.4 - 04.5 (mg/dL)   GFR calc non Af Amer 86 (*) >90 (mL/min)   GFR calc Af Amer >90  >90 (mL/min)  CBC     Status: Abnormal   Collection Time   10/28/11  5:20  AM      Component Value Range   WBC 4.0  4.0 - 10.5 (K/uL)   RBC 3.08 (*) 3.87 - 5.11 (MIL/uL)   Hemoglobin 9.8 (*) 12.0 - 15.0 (g/dL)   HCT 91.4 (*) 78.2 - 46.0 (%)   MCV 92.2  78.0 - 100.0 (fL)   MCH 31.8  26.0 - 34.0 (pg)   MCHC 34.5  30.0 - 36.0 (g/dL)   RDW 95.6  21.3 - 08.6 (%)   Platelets 214  150 - 400 (K/uL)  BASIC METABOLIC PANEL     Status: Abnormal   Collection Time   10/28/11  5:20 AM      Component Value Range   Sodium 128 (*) 135 - 145 (mEq/L)   Potassium 3.9  3.5 - 5.1 (mEq/L)   Chloride 95 (*) 96 - 112 (mEq/L)   CO2 25  19 - 32 (mEq/L)   Glucose, Bld 80  70 - 99 (mg/dL)   BUN 11  6 - 23 (mg/dL)   Creatinine, Ser 5.78  0.50 - 1.10 (mg/dL)   Calcium 8.7  8.4 - 46.9 (mg/dL)   GFR calc non Af Amer 89 (*) >90 (mL/min)   GFR calc Af Amer >90  >90 (mL/min)  PHENYTOIN LEVEL, TOTAL     Status: Abnormal   Collection Time   10/28/11  5:20 AM      Component Value Range   Phenytoin Lvl 23.5 (*) 10.0 - 20.0 (ug/mL)  ALBUMIN     Status: Abnormal    Collection Time   10/28/11  5:20 AM      Component Value Range   Albumin 2.9 (*) 3.5 - 5.2 (g/dL)   Lipid Panel No results found for this basename: CHOL,TRIG,HDL,CHOLHDL,VLDL,LDLCALC in the last 72 hours  Studies/Results: No results found.  Medications:     Scheduled:   . aspirin EC  81 mg Oral Daily  . cholecalciferol  1,000 Units Oral Daily  . enoxaparin  40 mg Subcutaneous Q24H  . metoprolol tartrate  25 mg Oral BID  . phenytoin  300 mg Oral QHS  . senna-docusate  1 tablet Oral QHS  . sodium chloride  1 g Oral BID WC  . vitamin C  500 mg Oral Daily  . DISCONTD: phenytoin  100 mg Oral TID    Assessment/Plan:    78 YO female who presents with seizure. No history of seizure in past. EEG showed generalized slowing. Dilantin was started and current dilantin level 23.5 corrected was 34.6.  MRI shows no acute infarct but significant global atrophy.   I have held dilantin dose for today and have consulted pharmacy to adjust dilantin.   Sodium levels slowly resolving.  No further seizures.   Recommend: Continue Dilantin with pharmacy to follow and make recommendations. Patient should follow up with neurologist out patient as she may be able to come off medication in future.    I have discussed with Dr. Raymond Gurney.   Felicie Morn PA-C [facility] [phone]  10/28/2011, 9:30 AM

## 2011-10-28 NOTE — Progress Notes (Signed)
Per MD, Pt likely med ready for d/c today.  Attempted to meet with Pt's son, Channing Mutters, at 1100 in Pt's room.  Per Pt, Channing Mutters has yet to be by.  Contacted Roy via phone.  Informed Roy that, per MD, it's likely that Pt will d/c today.  Discussed bed offers with Channing Mutters.  Channing Mutters stated that he should be at [facility] by 11:45 and asked if CSW could meet with him then.  Attempted to meet with Channing Mutters in Pt's room.  Channing Mutters still hasn't arrived.    Discussed bed offers with Pt.  Pt interested in the facility that's closest to [place].  [facility] is closest to [place].  Pt stated that she'd like to go there but asked CSW to confirm with her son.  Text-paged MD requesting D/c summary.  CSW to continue to follow.  Providence Crosby, LCSWA Clinical Social Work [phone]

## 2011-10-28 NOTE — Discharge Summary (Signed)
[name],[name] Female, 78 y.o., Nov 23, 1929  Admit date: 10/24/2011 Discharge date: 10/28/2011  Primary Care Physician:  [facility]   Discharge Diagnoses:   No resolved problems to display.  Active Hospital Problems  Diagnoses Date Noted   . Cervical subluxation 10/24/2011   . Hyponatremia 06/14/2011   . Psychosis 06/14/2011   . Rheumatoid arthritis 06/26/2007   . HYPERTENSION 06/26/2007   . HYPERLIPIDEMIA 06/26/2007   . ANXIETY 06/26/2007     Resolved Hospital Problems  Diagnoses Date Noted Date Resolved     DISCHARGE MEDICATION: Medication List  As of 10/28/2011  2:04 PM   STOP taking these medications         metoprolol tartrate 25 MG tablet         TAKE these medications         cholecalciferol 1000 UNITS tablet   Commonly known as: VITAMIN D   Take 1,000 Units by mouth daily.      ferrous fumarate 325 (106 FE) MG Tabs   Commonly known as: HEMOCYTE - 106 mg FE   Take 1 tablet by mouth.      folic acid 1 MG tablet   Commonly known as: FOLVITE   Take 1 mg by mouth daily.      phenytoin 200 MG ER capsule   Commonly known as: DILANTIN   Take 1 capsule (200 mg total) by mouth at bedtime.   Start taking on: 10/29/2011      senna-docusate 8.6-50 MG per tablet   Commonly known as: Senokot-Harmon   Take 1 tablet by mouth at bedtime.      sodium chloride 1 G tablet   Take 1 tablet (1 g total) by mouth 2 (two) times daily with a meal.      vitamin C 500 MG tablet   Commonly known as: ASCORBIC ACID   Take 500 mg by mouth daily.              Consults: Treatment Team:  Kym Groom, MD   SIGNIFICANT DIAGNOSTIC STUDIES:  Ct Head Wo Contrast  10/24/2011  *RADIOLOGY REPORT*  Clinical Data:  Neck pain, fall, dizziness, history hypertension, rheumatoid arthritis  CT HEAD WITHOUT CONTRAST CT CERVICAL SPINE WITHOUT CONTRAST  Technique:  Multidetector CT imaging of the head and cervical spine was performed following the standard protocol  without intravenous contrast.  Multiplanar CT image reconstructions of the cervical spine were also generated.  Comparison:  CT head 11/12/2010, CT cervical spine 07/27/2010  CT HEAD  Findings: Generalized atrophy. Stable ventricular morphology. No midline shift or mass effect. Tip of odontoid process and foramen magnum. No intracranial hemorrhage, mass lesion, or evidence of acute infarction. No definite extra-axial fluid collections. Visualized paranasal sinuses and mastoid air cells clear. Calvaria intact.  IMPRESSION: No acute intracranial abnormalities, see below  CT CERVICAL SPINE  Findings: Visualized skull base intact. Chronic malalignment of C1-C2 with posterior subluxation of C2 with respect C1. Predental space measures 10 mm AP. Posterior displacement of C2 places the tip of the odontoid process within the foramen magnum and causes marked AP narrowing of the canal, with residual AP diameter of  5.3 mm between the posterior margin of the odontoid and the anterior margin of the posterior arch of C1. This is likely causing marked compression of the cervicomedullary junction. However this finding is not significantly changed since the prior exam.  Prevertebral soft tissues are normal appearance. Bones appear demineralized. Vertebral body heights maintained without fracture or  additional subluxation. Significant degenerative changes of the C1-C2 articulations are identified, greater on the right. Biapical lung scarring identified.  Osseous demineralization.  IMPRESSION: Chronic retrolisthesis of C2 versus C1 with marked widening of the predental space to 10 mm AP and marked narrowing of the spinal canal to 5.3 mm AP diameter between the odontoid process and posterior arch C1. This is likely causing marked compression of the cervicomedullary junction but is not significantly changed since previous study of 07/27/2010. No new osseous findings identified.  Original Report Authenticated By: Lollie Marrow, M.D.    Ct Cervical Spine Wo Contrast  10/24/2011  *RADIOLOGY REPORT*  Clinical Data:  Neck pain, fall, dizziness, history hypertension, rheumatoid arthritis  CT HEAD WITHOUT CONTRAST CT CERVICAL SPINE WITHOUT CONTRAST  Technique:  Multidetector CT imaging of the head and cervical spine was performed following the standard protocol without intravenous contrast.  Multiplanar CT image reconstructions of the cervical spine were also generated.  Comparison:  CT head 11/12/2010, CT cervical spine 07/27/2010  CT HEAD  Findings: Generalized atrophy. Stable ventricular morphology. No midline shift or mass effect. Tip of odontoid process and foramen magnum. No intracranial hemorrhage, mass lesion, or evidence of acute infarction. No definite extra-axial fluid collections. Visualized paranasal sinuses and mastoid air cells clear. Calvaria intact.  IMPRESSION: No acute intracranial abnormalities, see below  CT CERVICAL SPINE  Findings: Visualized skull base intact. Chronic malalignment of C1-C2 with posterior subluxation of C2 with respect C1. Predental space measures 10 mm AP. Posterior displacement of C2 places the tip of the odontoid process within the foramen magnum and causes marked AP narrowing of the canal, with residual AP diameter of  5.3 mm between the posterior margin of the odontoid and the anterior margin of the posterior arch of C1. This is likely causing marked compression of the cervicomedullary junction. However this finding is not significantly changed since the prior exam.  Prevertebral soft tissues are normal appearance. Bones appear demineralized. Vertebral body heights maintained without fracture or additional subluxation. Significant degenerative changes of the C1-C2 articulations are identified, greater on the right. Biapical lung scarring identified.  Osseous demineralization.  IMPRESSION: Chronic retrolisthesis of C2 versus C1 with marked widening of the predental space to 10 mm AP and marked narrowing  of the spinal canal to 5.3 mm AP diameter between the odontoid process and posterior arch C1. This is likely causing marked compression of the cervicomedullary junction but is not significantly changed since previous study of 07/27/2010. No new osseous findings identified.  Original Report Authenticated By: Lollie Marrow, M.D.   Mr Laqueta Jean WU Contrast  10/25/2011  *RADIOLOGY REPORT*  Clinical Data: Confusion.  Possible seizure.  Prior fall. Dizziness.  High blood pressure.  MRI HEAD WITHOUT AND WITH CONTRAST  Technique:  Multiplanar, multiecho pulse sequences of the brain and surrounding structures were obtained according to standard protocol without and with intravenous contrast  Contrast: 11mL MULTIHANCE GADOBENATE DIMEGLUMINE 529 MG/ML IV SOLN  Comparison: 10/24/2011 head CT.  No comparison MR.  Findings: Motion degraded exam.  No acute infarct.  Marked widening of the predental space with 13.7 mm gap.  The dens projects superiorly and posteriorly causing marked compression of the cervical medullary junction. Suggestion of increased signal within the compressed cervical medullary junction which may represent edema and / or gliosis.  No intracranial hemorrhage.  Global atrophy without hydrocephalus.  Small vessel disease type changes.  No intracranial mass or abnormal enhancement.  No seizure focus detected.  No evidence of  mesial temporal sclerosis.  Major intracranial vascular structures are patent.  IMPRESSION: Motion degraded exam.  No acute infarct.  Marked widening of the predental space with 13.7 mm gap.  The dens projects superiorly and posteriorly causing marked compression of the cervical medullary junction. Suggestion of increased signal within the compressed cervical medullary junction which may represent edema and / or gliosis.  Original Report Authenticated By: Fuller Canada, M.D.      Recent Results (from the past 240 hour(Harmon))  MRSA PCR SCREENING     Status: Normal   Collection Time    10/25/11  4:54 AM      Component Value Range Status Comment   MRSA by PCR NEGATIVE  NEGATIVE  Final     BRIEF ADMITTING H & P:J erneata Harmon Friedlander is an 78 y.o. female with history of psychiatric disorders including anxiety and prior psychosis, severe rheumatoid arthritis with cervical subluxation, prior history of hyponatremia, frequent falls, noncompliant with medication versus confusion with her medications, brought into the emergency room by her son as she fell yesterday hitting her head in the occiput.Evaluation in emergency room included a head CT which showed no acute process, a cervical CT shows severe subluxation of C1 and C2 that is unchanged from one year ago. Her serology showed hyponatremia with a serum sodium of 120 mEq per liter.  Neurology was called and patient was started on Dilantin of which they recommended that patient continue until reevaluated by a neurologist as outpatient.  Seizure: Likely related to hyponatremia.   Dilantin level was high.  So Dilantin will be held today 5/3 and initiated tomorrow.  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  Patients sodium improved with sodium tablets and fluid restriction.  Should follow up with primary care physician in ~1 week or sooner for repeat lab testing.  HYPERLIPIDEMIA (06/26/2007) Stable will continue current regimen.   ANXIETY (06/26/2007) Stable will continue to monitor.   HYPERTENSION (06/26/2007) Blood pressure has been low normal while sleeping and as such will discontinue metoprolol until evaluated by patient'Harmon primary care doctor.  Patient aware and agreeable  Rheumatoid arthritis (06/26/2007) Patient is off of her methotrexate. Son feels like this medication is causing more harm than good. Will continue to hold for now.   Psychosis (06/14/2011) Resolved and may have been related to her hyponatremia.   Cervical subluxation  (10/24/2011) Reportedly unchanged from one year ago.     No resolved problems to display.  Active Hospital Problems  Diagnoses Date Noted   . Cervical subluxation 10/24/2011   . Hyponatremia 06/14/2011   . Psychosis 06/14/2011   . Rheumatoid arthritis 06/26/2007   . HYPERTENSION 06/26/2007   . HYPERLIPIDEMIA 06/26/2007   . ANXIETY 06/26/2007     Resolved Hospital Problems  Diagnoses Date Noted Date Resolved     Disposition and Follow-up: As indicated below Discharge Orders    Future Orders Please Complete By Expires   Diet - low sodium heart healthy      Increase activity slowly      Discharge instructions      Comments:   Please stop taking metoprolol  Follow up with Neurologist in 1-2 weeks  Follow up with primary care physician in ~1 week or sooner should any other concerns arise.  Repeat dilantin level in 1 week.  Limit fluid intake to ~ 33 ounces per day   Call MD for:  difficulty breathing, headache or visual disturbances      Call MD for:  persistant dizziness or light-headedness      Call MD for:  extreme fatigue           DISCHARGE EXAM:  General: Alert, awake, oriented x3, in no acute distress. HEENT: EOMI, Nystagmus noted, atraumatic normocephalic No bruits, no goiter. Heart: Regular rate and rhythm, without murmurs, rubs, gallops. Lungs: Clear to auscultation bilaterally. Abdomen: Soft, nontender, nondistended, positive bowel sounds. Extremities: No clubbing cyanosis or edema with positive pedal pulses. Neuro: Grossly intact, nonfocal.   Blood pressure 125/69, pulse 72, temperature 98.5 F (36.9 C), temperature source Oral, resp. rate 18, height 5\' 2"  (1.575 m), weight 56.246 kg (124 lb), SpO2 100.00%.   Basename 10/28/11 0520 10/27/11 1754  NA 128* 126*  K 3.9 4.1  CL 95* 93*  CO2 25 25  GLUCOSE 80 131*  BUN 11 18  CREATININE 0.52 0.57  CALCIUM 8.7 8.4  MG -- --  PHOS -- --    Basename 10/28/11 0520 10/26/11 0331  AST -- 24  ALT --  12  ALKPHOS -- 43  BILITOT -- 0.3  PROT -- 6.3  ALBUMIN 2.9* 3.2*   No results found for this basename: LIPASE:2,AMYLASE:2 in the last 72 hours  Basename 10/28/11 0520 10/26/11 0331  WBC 4.0 3.0*  NEUTROABS -- --  HGB 9.8* 11.0*  HCT 28.4* 31.5*  MCV 92.2 91.6  PLT 214 228    Signed: Penny Pia M.D. 10/28/2011, 2:04 PM

## 2011-10-28 NOTE — Progress Notes (Signed)
Physical Therapy Treatment Patient Details Name: [name] MRN: [id number] DOB: July 14, 1929 Today's Date: 10/28/2011 Time: 9629-5284 PT Time Calculation (min): 27 min 2 gt  PT Assessment / Plan / Recommendation Comments on Treatment Session  pt in bed, closing one eye then the other, "my vision is cloudy in my L eye". Pt required min assist to get to EOB, and required +2 assist to follow with chair;  pt=50 % to propogate forward 15 feet with RW.  Pt had posterior and R lean, unequal weight shift, and apprehensive to ambulate: "wait a minute, wait a minute".  Pt very pleasant to work with, just unable to ambulate without assistance.    Follow Up Recommendations  Skilled nursing facility    Equipment Recommendations  Defer to next venue    Frequency     Plan      Precautions / Restrictions   fall risk  Pertinent Vitals/Pain No c/o pain    Mobility  Bed Mobility Bed Mobility: Supine to Sit Supine to Sit: HOB elevated;4: Min assist Details for Bed Mobility Assistance: assisted pt with LE off EOB Transfers Transfers: Sit to Stand;Stand to Sit Sit to Stand: 4: Min assist;With upper extremity assist;From bed;From elevated surface Stand to Sit: 4: Min assist;With upper extremity assist;To chair/3-in-1 Details for Transfer Assistance: VC and manual assisted UE/hand placement to push off bed to stand with RW Ambulation/Gait Ambulation/Gait Assistance: 1: +2 Total assist Ambulation/Gait: Patient Percentage: 50 Ambulation Distance (Feet): 15 Feet Assistive device: Rolling walker Ambulation/Gait Assistance Details: +2 assist to follow with chair.  Pt has posterior and R lean, unequal weight shift, required VC for gait pattern and stepping LLE.  Gait Pattern: Step-to pattern;Shuffle;Lateral trunk lean to right;Decreased weight shift to left Gait velocity: slow General Gait Details: "wait a minute, wait a minute", reluctant to move Stairs: No Wheelchair Mobility Wheelchair  Mobility: No        PT Goals Acute Rehab PT Goals PT Goal Formulation: With patient Pt will go Supine/Side to Sit: with supervision PT Goal: Supine/Side to Sit - Progress: Progressing toward goal Pt will go Sit to Stand: with supervision PT Goal: Sit to Stand - Progress: Progressing toward goal Pt will go Stand to Sit: with min assist PT Goal: Stand to Sit - Progress: Progressing toward goal Pt will Ambulate: 51 - 150 feet;with min assist;with least restrictive assistive device PT Goal: Ambulate - Progress: Progressing toward goal  Visit Information  Last PT Received On: 10/28/11 Assistance Needed: +2    Subjective Data  Subjective: Pt in bed, kept closing one eye,asked her why and she said "my vision is cloudy in my left eye. Must be that sunlight." (blinds were closed on window) Patient Stated Goal: "don't send me to no little place" refering to SNF   Cognition          End of Session PT - End of Session Equipment Utilized During Treatment: Gait belt Activity Tolerance: Patient tolerated treatment well Patient left: in chair;with call bell/phone within reach Nurse Communication: Mobility status    Hiram Comber, SPTA 10/28/2011, 3:06 PM  Felecia Shelling  PTA [facility]  Acute  Rehab Pager     [phone]

## 2011-11-11 ENCOUNTER — Encounter (HOSPITAL_COMMUNITY): Payer: Self-pay | Admitting: *Deleted

## 2011-11-11 ENCOUNTER — Emergency Department (HOSPITAL_COMMUNITY): Payer: Medicare Other

## 2011-11-11 ENCOUNTER — Inpatient Hospital Stay (HOSPITAL_COMMUNITY)
Admission: EM | Admit: 2011-11-11 | Discharge: 2011-11-25 | DRG: 643 | Disposition: A | Discharge status: Skilled Nursing Facility | Payer: Medicare Other | Attending: Family Medicine | Admitting: Family Medicine

## 2011-11-11 DIAGNOSIS — R4182 Altered mental status, unspecified: Secondary | ICD-10-CM

## 2011-11-11 DIAGNOSIS — I10 Essential (primary) hypertension: Secondary | ICD-10-CM | POA: Diagnosis present

## 2011-11-11 DIAGNOSIS — E236 Other disorders of pituitary gland: Secondary | ICD-10-CM

## 2011-11-11 DIAGNOSIS — M069 Rheumatoid arthritis, unspecified: Secondary | ICD-10-CM

## 2011-11-11 DIAGNOSIS — R64 Cachexia: Secondary | ICD-10-CM | POA: Diagnosis present

## 2011-11-11 DIAGNOSIS — G9340 Encephalopathy, unspecified: Secondary | ICD-10-CM | POA: Diagnosis present

## 2011-11-11 DIAGNOSIS — E876 Hypokalemia: Secondary | ICD-10-CM | POA: Diagnosis present

## 2011-11-11 DIAGNOSIS — G9341 Metabolic encephalopathy: Secondary | ICD-10-CM | POA: Diagnosis present

## 2011-11-11 DIAGNOSIS — D509 Iron deficiency anemia, unspecified: Secondary | ICD-10-CM | POA: Diagnosis present

## 2011-11-11 DIAGNOSIS — N390 Urinary tract infection, site not specified: Secondary | ICD-10-CM | POA: Diagnosis present

## 2011-11-11 DIAGNOSIS — S13100A Subluxation of unspecified cervical vertebrae, initial encounter: Secondary | ICD-10-CM | POA: Diagnosis present

## 2011-11-11 DIAGNOSIS — F411 Generalized anxiety disorder: Secondary | ICD-10-CM | POA: Diagnosis present

## 2011-11-11 DIAGNOSIS — R443 Hallucinations, unspecified: Secondary | ICD-10-CM | POA: Diagnosis present

## 2011-11-11 DIAGNOSIS — E782 Mixed hyperlipidemia: Secondary | ICD-10-CM

## 2011-11-11 DIAGNOSIS — B9689 Other specified bacterial agents as the cause of diseases classified elsewhere: Secondary | ICD-10-CM | POA: Diagnosis present

## 2011-11-11 DIAGNOSIS — E871 Hypo-osmolality and hyponatremia: Secondary | ICD-10-CM | POA: Diagnosis present

## 2011-11-11 DIAGNOSIS — M5020 Other cervical disc displacement, unspecified cervical region: Secondary | ICD-10-CM | POA: Diagnosis present

## 2011-11-11 DIAGNOSIS — R410 Disorientation, unspecified: Secondary | ICD-10-CM

## 2011-11-11 HISTORY — DX: Unspecified psychosis not due to a substance or known physiological condition: F29

## 2011-11-11 HISTORY — DX: Rheumatoid arthritis, unspecified: M06.9

## 2011-11-11 HISTORY — DX: Hypo-osmolality and hyponatremia: E87.1

## 2011-11-11 LAB — URINE MICROSCOPIC-ADD ON

## 2011-11-11 LAB — CBC
Hemoglobin: 11.3 g/dL — ABNORMAL LOW (ref 12.0–15.0)
MCHC: 36.2 g/dL — ABNORMAL HIGH (ref 30.0–36.0)
Platelets: 311 10*3/uL (ref 150–400)

## 2011-11-11 LAB — URINALYSIS, ROUTINE W REFLEX MICROSCOPIC
Ketones, ur: 40 mg/dL — AB
Nitrite: POSITIVE — AB
Specific Gravity, Urine: 1.021 (ref 1.005–1.030)
pH: 6 (ref 5.0–8.0)

## 2011-11-11 LAB — BASIC METABOLIC PANEL
Chloride: 87 mEq/L — ABNORMAL LOW (ref 96–112)
Creatinine, Ser: 0.48 mg/dL — ABNORMAL LOW (ref 0.50–1.10)
GFR calc Af Amer: >90 mL/min (ref >90)
Potassium: 4 mEq/L (ref 3.5–5.1)

## 2011-11-11 MED ORDER — ACETAMINOPHEN 325 MG PO TABS
650.0000 mg | ORAL_TABLET | Freq: Four times a day (QID) | ORAL | Status: DC | PRN
  Administered 2011-11-12: 650 mg via ORAL
  Filled 2011-11-11: qty 2

## 2011-11-11 MED ORDER — ONDANSETRON HCL 4 MG PO TABS: 4.0000 mg | ORAL_TABLET | Freq: Four times a day (QID) | ORAL | Status: DC | PRN

## 2011-11-11 MED ORDER — DOCUSATE SODIUM 100 MG PO CAPS
100.0000 mg | ORAL_CAPSULE | Freq: Two times a day (BID) | ORAL | Status: DC
  Administered 2011-11-12 – 2011-11-25 (×22): 100 mg via ORAL
  Filled 2011-11-11 (×29): qty 1

## 2011-11-11 MED ORDER — PHENYTOIN SODIUM EXTENDED 100 MG PO CAPS
200.0000 mg | ORAL_CAPSULE | Freq: Every day | ORAL | Status: DC
  Administered 2011-11-12 – 2011-11-24 (×11): 200 mg via ORAL
  Filled 2011-11-11 (×14): qty 2

## 2011-11-11 MED ORDER — CIPROFLOXACIN IN D5W 200 MG/100ML IV SOLN
200.0000 mg | Freq: Two times a day (BID) | INTRAVENOUS | Status: AC
  Administered 2011-11-11 – 2011-11-15 (×9): 200 mg via INTRAVENOUS
  Filled 2011-11-11 (×11): qty 100

## 2011-11-11 MED ORDER — ENOXAPARIN SODIUM 40 MG/0.4ML ~~LOC~~ SOLN
40.0000 mg | Freq: Every day | SUBCUTANEOUS | Status: DC
  Administered 2011-11-12 – 2011-11-24 (×11): 40 mg via SUBCUTANEOUS
  Filled 2011-11-11 (×15): qty 0.4

## 2011-11-11 MED ORDER — ONDANSETRON HCL 4 MG/2ML IJ SOLN: 4.0000 mg | Freq: Four times a day (QID) | INTRAMUSCULAR | Status: DC | PRN

## 2011-11-11 MED ORDER — SENNA 8.6 MG PO TABS
1.0000 | ORAL_TABLET | Freq: Two times a day (BID) | ORAL | Status: DC
  Administered 2011-11-12 – 2011-11-25 (×22): 8.6 mg via ORAL
  Filled 2011-11-11 (×23): qty 1

## 2011-11-11 MED ORDER — SODIUM CHLORIDE 0.9 % IJ SOLN
3.0000 mL | Freq: Two times a day (BID) | INTRAMUSCULAR | Status: DC
  Administered 2011-11-12: 3 mL via INTRAVENOUS

## 2011-11-11 MED ORDER — SODIUM CHLORIDE 0.9 % IV SOLN: 250.0000 mL | INTRAVENOUS | Status: DC | PRN

## 2011-11-11 MED ORDER — SODIUM CHLORIDE 1 G PO TABS
1.0000 g | ORAL_TABLET | Freq: Two times a day (BID) | ORAL | Status: DC
  Administered 2011-11-12: 1 g via ORAL
  Filled 2011-11-11 (×3): qty 1

## 2011-11-11 MED ORDER — SODIUM CHLORIDE 0.9 % IJ SOLN: 3.0000 mL | INTRAMUSCULAR | Status: DC | PRN

## 2011-11-11 MED ORDER — ACETAMINOPHEN 650 MG RE SUPP: 650.0000 mg | Freq: Four times a day (QID) | RECTAL | Status: DC | PRN

## 2011-11-11 MED ORDER — SODIUM CHLORIDE 0.9 % IJ SOLN
3.0000 mL | Freq: Two times a day (BID) | INTRAMUSCULAR | Status: DC
  Administered 2011-11-12 – 2011-11-15 (×6): 3 mL via INTRAVENOUS
  Administered 2011-11-19: 10 mL via INTRAVENOUS
  Administered 2011-11-21: 3 mL via INTRAVENOUS

## 2011-11-11 MED ORDER — FOLIC ACID 1 MG PO TABS
1.0000 mg | ORAL_TABLET | Freq: Every day | ORAL | Status: DC
  Administered 2011-11-12 – 2011-11-25 (×10): 1 mg via ORAL
  Filled 2011-11-11 (×14): qty 1

## 2011-11-11 NOTE — ED Notes (Signed)
Floor RN unavailable for report at this time. Will return call.

## 2011-11-11 NOTE — ED Notes (Signed)
MD resident at bedside

## 2011-11-11 NOTE — H&P (Signed)
PCP:  REED, TIFFANY, DO, DO   Per NH notes and prior admissions  Chief Complaint:  Altered mental status  HPI: 81yoF with h/o severe RA with cervical subluxation, h/o hypoNa thought  due to SIADH, frequent falls, prior psychosis in setting of UTI, low Na,  and steroid use who now presents with hypoNa, confusion, hallucinations  and found to have lower than baseline low sodium and UTI.   Pt was admitted to [facility] in 05/2011 with psychotic features after having  started prednisone, and found to have hypoNa 118. Noted to have h/o  hypoNa due to thiazides, and had seizure 01/2011 with Na 111. Psychosis  thought due to steroids, UTI at that time, and hypoNa. Renal also  consulted and felt due to SIADH so started demeclocycline, increased to  129. She was then admitted 10/24/2011 with a fall and occiput hit, and Na  120. She was given Na tablets and fluid restriction and this improved.  Dilantin level was high, decreased to Dilantin ER 200 HS.   Pt cannot give history, she is alert and can follow simple commands but  is speaking gibberish, refusing to answer questions. Per ED discussion,  discussion with friends who were here earlier but not now, and NH notes,  she lives at [facility] and at baseline will take her meds, is  communicative. For past 2d, not taking meds, more confused,  hallucinating.   In the ED vitals were stable. Labs with hypoNa 121 down from mid-high  120's earlier this month, hypoCl 87. Renal normal. CBC stable. UA with  infection, positive nitrite, large LE, too numerous to count WBC's, many  bacteria. Nothing was given to the pt in the ED.   ROS otherwise not obtainable.    Past Medical History  Diagnosis Date  . Hypertension   . Dysrhythmia   . Rheumatoid arthritis     With known cervical subluxation  . Fall 06/01/11    twice this year  . Incontinence of urine     wears depends  . 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 Psychoses 05/2011    Hallucinations and AMS in setting of low Na, UTI, and steroid use     Past Surgical History  Procedure Date  . Toe surgery     great toe bilateral feet due to arthrits    Medications:  HOME MEDS: Per NH records  Prior to Admission medications   Medication Sig Start Date End Date Taking? Authorizing Provider  cholecalciferol (VITAMIN D) 1000 UNITS tablet Take 1,000 Units by mouth daily.     Yes Historical Provider, MD  ferrous fumarate (HEMOCYTE - 106 MG FE) 325 (106 FE) MG TABS Take 1 tablet by mouth daily.    Yes Historical Provider, MD  folic acid (FOLVITE) 1 MG tablet Take 1 mg by mouth daily.     Yes Historical Provider, MD  LORazepam (ATIVAN IJ) Inject 1 Syringe as directed once.   Yes Historical Provider, MD  phenytoin (DILANTIN) 200 MG ER capsule Take 1 capsule (200 mg total) by mouth at bedtime. 10/29/11 10/28/12 Yes Penny Pia, MD  senna-docusate (SENOKOT-S) 8.6-50 MG per tablet Take 1 tablet by mouth at bedtime. 06/20/11 06/19/12 Yes Hind I Elsaid, MD  sodium chloride 1 G tablet Take 1 tablet (1 g total) by mouth 2 (two) times daily with a meal. 10/28/11 10/27/12 Yes Penny Pia, MD  vitamin C (ASCORBIC ACID) 500 MG tablet Take 500 mg by mouth daily.  Yes Historical Provider, MD    Allergies:  Allergies  Allergen Reactions  . Codeine Anxiety  . Colchicine Anxiety  . Famotidine Anxiety  . Penicillins Anxiety    Social History:   reports that she has never smoked. She has never used smokeless tobacco. She reports that she does not drink alcohol or use illicit drugs.  Lives at [facility].  Marvin Siddle [phone]. [facility] 336 [phone]. Lewanda Rife niece [phone]  Family History: History reviewed. No pertinent family history.  Physical Exam: Filed Vitals:   11/11/11 1711 11/11/11 2050 11/11/11 2130  BP: 148/69 136/69 140/72  Pulse: 77 81 78  Temp: 98.7 F (37.1 C) 98.9 F (37.2 C)   TempSrc: Oral Oral   Resp: 16 16 18   SpO2: 98% 100% 100%   Blood  pressure 140/72, pulse 78, temperature 98.9 F (37.2 C), temperature source Oral, resp. rate 18, SpO2 100.00%.  Gen: elderly, frail appearing F who appears delirous, is alert and can  follow simple commands but cannot participate in conversation.  Cardiopulmonary is stable. Is speaking gibberish to herself on entering  the room. HEENT: Pupils round but constricted, EOMI grossly, sclera clear. Mouth  moist but cannot fully assess Lungs: Anteriorly grossly clear, no w/c/r, good air movement, no  increased WOB or accessory muscles use Heart: regular, S1/2 clear, no m/g heard, no heaves, grossly normal Abd: Soft, not tender, not distended, no grimacing to palpation, benign Extrem: Thin, wamr, radials palpable, severe RA changes bilaterally  noted with large MCP's, PIP's, Z-line deformity noted, wrist swellings.  No BLE edema noted.  Neuro: ALert and looking around, can follow commands to open eyes, lift  arms, open mouth, but inattnetive, ignores most questions, and when she  does answer it's gibberish nonsense. However, CN 2-12 intact, speech is  clear and not slurred. Able to lift BUE's up a bit, but doesn't fully  follow further commands, and simply refuses to move her legs.    Labs & Imaging Results for orders placed during the hospital encounter of 11/11/11 (from the past 48 hour(s))  BASIC METABOLIC PANEL     Status: Abnormal   Collection Time   11/11/11  6:13 PM      Component Value Range Comment   Sodium 121 (*) 135 - 145 (mEq/L)    Potassium 4.0  3.5 - 5.1 (mEq/L)    Chloride 87 (*) 96 - 112 (mEq/L)    CO2 24  19 - 32 (mEq/L)    Glucose, Bld 93  70 - 99 (mg/dL)    BUN 17  6 - 23 (mg/dL)    Creatinine, Ser 0.86 (*) 0.50 - 1.10 (mg/dL)    Calcium 9.0  8.4 - 10.5 (mg/dL)    GFR calc non Af Amer >90  >90 (mL/min)    GFR calc Af Amer >90  >90 (mL/min)   CBC     Status: Abnormal   Collection Time   11/11/11  6:13 PM      Component Value Range Comment   WBC 4.6  4.0 - 10.5  (K/uL)    RBC 3.55 (*) 3.87 - 5.11 (MIL/uL)    Hemoglobin 11.3 (*) 12.0 - 15.0 (g/dL)    HCT 57.8 (*) 46.9 - 46.0 (%)    MCV 87.9  78.0 - 100.0 (fL)    MCH 31.8  26.0 - 34.0 (pg)    MCHC 36.2 (*) 30.0 - 36.0 (g/dL)    RDW 62.9  52.8 -  15.5 (%)    Platelets 311  150 - 400 (K/uL)   URINALYSIS, ROUTINE W REFLEX MICROSCOPIC     Status: Abnormal   Collection Time   11/11/11  6:31 PM      Component Value Range Comment   Color, Urine YELLOW  YELLOW     APPearance CLOUDY (*) CLEAR     Specific Gravity, Urine 1.021  1.005 - 1.030     pH 6.0  5.0 - 8.0     Glucose, UA NEGATIVE  NEGATIVE (mg/dL)    Hgb urine dipstick TRACE (*) NEGATIVE     Bilirubin Urine NEGATIVE  NEGATIVE     Ketones, ur 40 (*) NEGATIVE (mg/dL)    Protein, ur NEGATIVE  NEGATIVE (mg/dL)    Urobilinogen, UA 0.2  0.0 - 1.0 (mg/dL)    Nitrite POSITIVE (*) NEGATIVE     Leukocytes, UA LARGE (*) NEGATIVE    URINE MICROSCOPIC-ADD ON     Status: Abnormal   Collection Time   11/11/11  6:31 PM      Component Value Range Comment   Squamous Epithelial / LPF RARE  RARE     WBC, UA TOO NUMEROUS TO COUNT  <3 (WBC/hpf)    RBC / HPF 3-6  <3 (RBC/hpf)    Bacteria, UA MANY (*) RARE     Dg Chest Port 1 View  11/11/2011  *RADIOLOGY REPORT*  Clinical Data: Altered mental status  PORTABLE CHEST - 1 VIEW  Comparison: 06/14/2011  Findings: High-riding right greater than left humeral head could signify rotator cuff tendinopathy or could be positional.  Heart size is normal.  Cardiac leads obscure detail. Aorta is ectatic and unfolded.  No focal pulmonary opacity.  No pleural effusion. Right- sided skin fold is noted but no pneumothorax is seen.  IMPRESSION: No acute cardiopulmonary process.  Original Report Authenticated By: Harrel Lemon, M.D.    ECG: NSR 80 bpm, normal axis, normal P and PR, no Q waves, narrow QRS,  no ST deviations, normal T waves, overall not ischemic. Normal ECG.   Impression Present on Admission:  .Hyponatremia .UTI  (urinary tract infection), uncomplicated .Psychosis .Hallucinations  81yoF with h/o severe RA with cervical subluxation, h/o hypoNa thought  due to SIADH, frequent falls, prior psychosis in setting of UTI, low Na,  and steroid use who now presents with hypoNa, confusion, hallucinations  and found to have lower than baseline low sodium and UTI.   1. Altered mental status: Not clear her mental status baseline or what  element dementia plays, but she seems delirious to me. Likely UTI,  hypoNa (same as 05/2011 admission), although UTI is probably more the  culprit as her Na is not drastically belwo baseline.  - Needs CXR, EKG to complete w/u. Treat UTI, hypoNa as below - Get dilantin level with next labs. Neuro checks   2. UTI: Needs UCx to be gathered. No prior culture data to guide  decision, despite 05/2011 UTI. This appears to have been treated with  cipro, so we'll start with this.  - Get UCx, start cipro.   3. HypoNa, h/o SIADH: Na 121 from baseline of mid-high 120's. Unclear  underlying SIADH etiology - Free water restrict, continue salt tablets. These maneuvers appears to  improve her during recent 09/2011 admission. Increase salt if not  improving. Trending BMET  - Orthostatics   4. H/o seizures from hypoNa: continue home dilantin per med rec for now  pending level   5. H/o RA: per prior  d/c summaries, they stopped MTX. Continue folate.   Telemetry, [facility] team 4 Lovenox DVT prophy Full code per NH records   Other plans as per orders.   Raihana Balderrama 11/11/2011, 9:38 PM

## 2011-11-11 NOTE — ED Provider Notes (Signed)
History     CSN: [id number]  Arrival date & time 11/11/11  1612   First MD Initiated Contact with Patient 11/11/11 1738      Chief Complaint  Patient presents with  . Altered Mental Status   HPI  Patient is a 78 year old lady with a past medical history of hypertension, rheumatoid arthritis, hyponatremia, history of falls and psychosis secondary to hyponatremia and a steroid usage, who presents with altered mental status.  Patient was recently hospitalized from April 29 until May 3 due to noncompliant with her medications, hyponatremia, as well as the altered mental status. She was evaluated with a CT scan of the head, was found to have a subluxation of C1 and C2, hyponatremia with a sodium level for 120. Neurology was consulted and the patient was thought to have psychosis secondary to hyponatremia and steroid to usage. Patient was started on Dilantin. After discharge,  patient was doing well until 2 days ago per her nurse (patient is a resident in [facility]). At her baseline, she communicates well, is orientated, takes her medications regularly with the help of nurse. In the past 2 days, patient becomes progressively confused, and has hallucination, talking to herself all the times, refuses to take her medications.   Denies fever, chills, headaches,  cough, chest pain, SOB,  abdominal pain,diarrhea, constipation, dysuria, urgency, frequency, hematuria, joint pain or leg swelling. .  Past Medical History  Diagnosis Date  . Hypertension   . Dysrhythmia   . Arthritis   . Fall 06/01/11    twice this year  . Incontinence of urine     wears depends    Past Surgical History  Procedure Date  . Toe surgery     great toe bilateral feet due to arthrits    History reviewed. No pertinent family history.  History  Substance Use Topics  . Smoking status: Never Smoker   . Smokeless tobacco: Never Used  . Alcohol Use: No    OB History    Grav Para Term Preterm Abortions  TAB SAB Ect Mult Living                  Review of Systems: difficult to do ROS due to being nonoperative in answering any questions.   Allergies  Codeine; Colchicine; Famotidine; and Penicillins  Home Medications   Current Outpatient Rx  Name Route Sig Dispense Refill  . VITAMIN D 1000 UNITS PO TABS Oral Take 1,000 Units by mouth daily.      Marland Kitchen FERROUS FUMARATE 325 (106 FE) MG PO TABS Oral Take 1 tablet by mouth daily.     Marland Kitchen FOLIC ACID 1 MG PO TABS Oral Take 1 mg by mouth daily.      . ATIVAN IJ Injection Inject 1 Syringe as directed once.    Marland Kitchen PHENYTOIN SODIUM EXTENDED 200 MG PO CAPS Oral Take 1 capsule (200 mg total) by mouth at bedtime. 20 capsule 0  . SENNOSIDES-DOCUSATE SODIUM 8.6-50 MG PO TABS Oral Take 1 tablet by mouth at bedtime. 30 tablet 0  . SODIUM CHLORIDE 1 G PO TABS Oral Take 1 tablet (1 g total) by mouth 2 (two) times daily with a meal. 60 tablet 0  . VITAMIN C 500 MG PO TABS Oral Take 500 mg by mouth daily.        BP 148/69  Pulse 77  Temp(Src) 98.7 F (37.1 C) (Oral)  Resp 16  SpO2 98%  Physical Exam  General: resting in bed, not  in acute distress. Talking to her self with sentences which do not make any sense.  HEENT: PERRL, EOMI, no scleral icterus Cardiac: S1/S2, RRR, No murmurs, gallops or rubs Pulm: Good air movement bilaterally, Clear to auscultation bilaterally, No rales, wheezing, rhonchi or rubs. Abd: Soft,  nondistended, nontender, no rebound pain, no organomegaly, BS present Ext: has 2+ non-pitting edema. No rashes, 2+DP/PT pulse bilaterally Neuro: oriented only to person (knows current president). Cranial nerves II-XII grossly intact, muscle strength 5/5 in all extremities. Increased sensitivity of pain to light tough in her legs.     ED Course  Procedures (including critical care time)  Etiology for patient's AMS and hallucination is not very clear now. But may be due to similar issues with previous admission, including psychosis 2/2  hyponatremia. Another differential diagnosis is delirium 2/2 possible infection , such as UTI. Per her nurse in [facility], patient did not have fall. Ct-head is not needed at this moment given her recent negative work up (except for C1 an C2 subluxation.  Will check her BMP for possible electrolyte disturbance and urinalysis for possible UTI. Will check her blood dilantin level.    Labs Reviewed  BASIC METABOLIC PANEL  URINALYSIS, ROUTINE W REFLEX MICROSCOPIC  CBC  PHENYTOIN LEVEL, FREE   No results found.   No diagnosis found.    MDM          Lorretta Harp, MD 11/11/11 1816

## 2011-11-11 NOTE — ED Notes (Addendum)
Per EMS pt in from [facility] with c/o AMS x's 1 week. Pt alert but cursing and unable to answer questions appropriately.

## 2011-11-11 NOTE — ED Notes (Signed)
[id number]<AV> Expected date:<BR> Expected time: 4:12 PM<BR> Means of arrival:<BR> Comments:<BR> M10 - 81yoF AMS, hallucinating x2days: hx psychosis issues

## 2011-11-12 ENCOUNTER — Encounter (HOSPITAL_COMMUNITY): Payer: Self-pay | Admitting: *Deleted

## 2011-11-12 DIAGNOSIS — G9340 Encephalopathy, unspecified: Secondary | ICD-10-CM | POA: Diagnosis present

## 2011-11-12 DIAGNOSIS — R197 Diarrhea, unspecified: Secondary | ICD-10-CM

## 2011-11-12 DIAGNOSIS — E236 Other disorders of pituitary gland: Secondary | ICD-10-CM

## 2011-11-12 DIAGNOSIS — M069 Rheumatoid arthritis, unspecified: Secondary | ICD-10-CM

## 2011-11-12 DIAGNOSIS — E782 Mixed hyperlipidemia: Secondary | ICD-10-CM

## 2011-11-12 LAB — BASIC METABOLIC PANEL
CO2: 24 mEq/L (ref 19–32)
CO2: 25 mEq/L (ref 19–32)
CO2: 25 mEq/L (ref 19–32)
CO2: 25 mEq/L (ref 19–32)
Calcium: 8.8 mg/dL (ref 8.4–10.5)
Calcium: 8.5 mg/dL (ref 8.4–10.5)
Calcium: 8.4 mg/dL (ref 8.4–10.5)
Chloride: 85 mEq/L — ABNORMAL LOW (ref 96–112)
Chloride: 88 mEq/L — ABNORMAL LOW (ref 96–112)
Chloride: 89 mEq/L — ABNORMAL LOW (ref 96–112)
Chloride: 89 mEq/L — ABNORMAL LOW (ref 96–112)
Creatinine, Ser: 0.47 mg/dL — ABNORMAL LOW (ref 0.50–1.10)
GFR calc Af Amer: >90 mL/min (ref >90)
Glucose, Bld: 90 mg/dL (ref 70–99)
Glucose, Bld: 115 mg/dL — ABNORMAL HIGH (ref 70–99)
Potassium: 3.8 mEq/L (ref 3.5–5.1)
Potassium: 4.2 mEq/L (ref 3.5–5.1)
Sodium: 119 mEq/L — CL (ref 135–145)
Sodium: 121 mEq/L — ABNORMAL LOW (ref 135–145)
Sodium: 122 mEq/L — ABNORMAL LOW (ref 135–145)

## 2011-11-12 LAB — PHENYTOIN LEVEL, TOTAL: Phenytoin Lvl: 11.2 ug/mL (ref 10.0–20.0)

## 2011-11-12 LAB — SODIUM, URINE, RANDOM: Sodium, Ur: 69 mEq/L

## 2011-11-12 MED ORDER — SODIUM CHLORIDE 1 G PO TABS
1.0000 g | ORAL_TABLET | Freq: Three times a day (TID) | ORAL | Status: DC
Start: 2011-11-13 — End: 2011-11-13
  Administered 2011-11-13 (×3): 1 g via ORAL
  Filled 2011-11-12 (×4): qty 1

## 2011-11-12 MED ORDER — SODIUM CHLORIDE 0.9 % IV SOLN
INTRAVENOUS | Status: DC
  Administered 2011-11-12 – 2011-11-13 (×3): via INTRAVENOUS

## 2011-11-12 NOTE — Progress Notes (Signed)
CRITICAL VALUE ALERT  Critical value received:  Sodium 119  Date of notification:  11/12/11  Time of notification:  0135  Critical value read back:yes  Nurse who received alert:  Mathis Fare  MD notified (1st page):  Mary lynch  Time of first page:  0137  MD notified (2nd page):  Time of second page:  Responding MD:  Elray Mcgregor  Time MD responded:  [phone]

## 2011-11-12 NOTE — Progress Notes (Addendum)
Subjective: Patient is confused.  Tearful at times.  Denies any specific pain.  Objective: Vital signs in last 24 hours: Filed Vitals:   11/11/11 2130 11/11/11 2304 11/11/11 2305 11/12/11 0706  BP: 140/72 143/76 168/76 108/62  Pulse: 78 72 78 72  Temp:  98 F (36.7 C)  98.5 F (36.9 C)  TempSrc:  Oral  Oral  Resp: 18 16  16   Height:   5\' 2"  (1.575 m)   Weight:   51.3 kg (113 lb 1.5 oz)   SpO2: 100% 100%  100%   Weight change:  No intake or output data in the 24 hours ending 11/12/11 1232  Physical Exam: General: Awake, not oriented x3, No acute distress. HEENT: EOMI. Neck: Supple CV: S1 and S2 Lungs: Clear to ascultation bilaterally Abdomen: Soft, Nontender, Nondistended, +bowel sounds. Ext: Good pulses. Trace edema.  Lab Results:  Basename 11/12/11 0710 11/11/11 2357  NA 122* 119*  K 4.2 3.8  CL 88* 85*  CO2 25 25  GLUCOSE 79 97  BUN 12 14  CREATININE 0.50 0.47*  CALCIUM 8.8 9.1  MG -- --  PHOS -- --   No results found for this basename: AST:2,ALT:2,ALKPHOS:2,BILITOT:2,PROT:2,ALBUMIN:2 in the last 72 hours No results found for this basename: LIPASE:2,AMYLASE:2 in the last 72 hours  Basename 11/11/11 1813  WBC 4.6  NEUTROABS --  HGB 11.3*  HCT 31.2*  MCV 87.9  PLT 311   No results found for this basename: CKTOTAL:3,CKMB:3,CKMBINDEX:3,TROPONINI:3 in the last 72 hours No components found with this basename: POCBNP:3 No results found for this basename: DDIMER:2 in the last 72 hours No results found for this basename: HGBA1C:2 in the last 72 hours No results found for this basename: CHOL:2,HDL:2,LDLCALC:2,TRIG:2,CHOLHDL:2,LDLDIRECT:2 in the last 72 hours No results found for this basename: TSH,T4TOTAL,FREET3,T3FREE,THYROIDAB in the last 72 hours No results found for this basename: VITAMINB12:2,FOLATE:2,FERRITIN:2,TIBC:2,IRON:2,RETICCTPCT:2 in the last 72 hours  Micro Results: No results found for this or any previous visit (from the past 240  hour(s)).  Studies/Results: Dg Chest Port 1 View  11/11/2011  *RADIOLOGY REPORT*  Clinical Data: Altered mental status  PORTABLE CHEST - 1 VIEW  Comparison: 06/14/2011  Findings: High-riding right greater than left humeral head could signify rotator cuff tendinopathy or could be positional.  Heart size is normal.  Cardiac leads obscure detail. Aorta is ectatic and unfolded.  No focal pulmonary opacity.  No pleural effusion. Right- sided skin fold is noted but no pneumothorax is seen.  IMPRESSION: No acute cardiopulmonary process.  Original Report Authenticated By: Harrel Lemon, M.D.    Medications: I have reviewed the patient's current medications. Scheduled Meds:   . ciprofloxacin  200 mg Intravenous Q12H  . docusate sodium  100 mg Oral BID  . enoxaparin  40 mg Subcutaneous QHS  . folic acid  1 mg Oral Daily  . phenytoin  200 mg Oral QHS  . senna  1 tablet Oral BID  . sodium chloride  3 mL Intravenous Q12H  . sodium chloride  3 mL Intravenous Q12H  . sodium chloride  1 g Oral BID WC   Continuous Infusions:  PRN Meds:.sodium chloride, acetaminophen, acetaminophen, ondansetron (ZOFRAN) IV, ondansetron, sodium chloride  Assessment/Plan: Altered mental status/acute delirium/metabolic encephalopathy Etiology unclear.  Likely due to urinary tract infection and hyponatremia.  Patient had MRI of the brain on 10/25/2011 which showed no acute infarct.  Continue treatment for urinary tract infection.  Suspect as hyponatremia improves mentation likely to improve.  Continue to monitor.  Discussed  with patient's son who reports that her mentation is worsened with the last 3 days and was similar to previous hospitalization.  Recurrent hyponatremia Etiology unclear.  Send urine sodium, creatinine, osmolarity.  Sent for plasma osmolarity.  Fluid restrict the patient to 1.5 L of normal saline.  Continue normal saline.  Continue salt tablets.  If sodium does not improve by tomorrow consider getting a  renal consultation.  Patient on phenytoin which may cause hyponatremia.  A suspected to be due to SIADH, underlying etiology unclear.  Urinary tract infection Continue ciprofloxacin.  Antibiotics since 11/11/2011.  Urine culture pending.  History of seizures from hyponatremia Continue Dilantin for now.  History of rheumatoid arthritis Methotrexate has been discontinued.  Continue Folate.  Hypertension Stable.  Not on any antihypertensive medications.  Anemia Thought to be due to iron deficiency.  Supplemental iron held.  Hyperlipidemia Stable.  History of cervical subluxation Unchanged from one year ago.  Prophylaxis Lovenox.  CODE STATUS Full code per son.  Patient had instructed the son that she would not wish to be on life support long-term.  Disposition Pending.  Son updated over the telephone.   LOS: 1 day  Aleasha Fregeau A, MD 11/12/2011, 12:32 PM

## 2011-11-13 DIAGNOSIS — M069 Rheumatoid arthritis, unspecified: Secondary | ICD-10-CM

## 2011-11-13 DIAGNOSIS — E782 Mixed hyperlipidemia: Secondary | ICD-10-CM

## 2011-11-13 DIAGNOSIS — E236 Other disorders of pituitary gland: Secondary | ICD-10-CM

## 2011-11-13 DIAGNOSIS — R197 Diarrhea, unspecified: Secondary | ICD-10-CM

## 2011-11-13 LAB — BASIC METABOLIC PANEL
BUN: 6 mg/dL (ref 6–23)
CO2: 25 mEq/L (ref 19–32)
CO2: 23 mEq/L (ref 19–32)
Chloride: 88 mEq/L — ABNORMAL LOW (ref 96–112)
Chloride: 91 mEq/L — ABNORMAL LOW (ref 96–112)
Creatinine, Ser: 0.41 mg/dL — ABNORMAL LOW (ref 0.50–1.10)
GFR calc Af Amer: >90 mL/min (ref >90)
Glucose, Bld: 158 mg/dL — ABNORMAL HIGH (ref 70–99)
Potassium: 4 mEq/L (ref 3.5–5.1)
Potassium: 3.5 mEq/L (ref 3.5–5.1)
Sodium: 122 mEq/L — ABNORMAL LOW (ref 135–145)

## 2011-11-13 LAB — CBC
HCT: 30.2 % — ABNORMAL LOW (ref 36.0–46.0)
MCV: 90.4 fL (ref 78.0–100.0)
Platelets: 313 10*3/uL (ref 150–400)
RBC: 3.34 MIL/uL — ABNORMAL LOW (ref 3.87–5.11)
RDW: 13.8 % (ref 11.5–15.5)
WBC: 2.1 10*3/uL — ABNORMAL LOW (ref 4.0–10.5)

## 2011-11-13 LAB — OSMOLALITY: Osmolality: 253 mOsm/kg — ABNORMAL LOW (ref 275–300)

## 2011-11-13 MED ORDER — SODIUM CHLORIDE 1 G PO TABS
2.0000 g | ORAL_TABLET | Freq: Three times a day (TID) | ORAL | Status: DC
Start: 2011-11-13 — End: 2011-11-15
  Administered 2011-11-13 – 2011-11-15 (×7): 2 g via ORAL
  Filled 2011-11-13 (×8): qty 2

## 2011-11-13 NOTE — Consult Note (Addendum)
[name] 11/13/2011 [name] Requesting Physician:  Dr. Betti Cruz  Reason for Consult:  Hyponatremia HPI: The patient is a 78 Tinao. year-old AAF with hx of severe RA, hyponatremia and SIADH, psychosis and UTIsI. She presented on 5/17 with altered mental status to [facility] ED. UA suggested UTI, and serum sodium was low at 119.  Patient was treated with NS ivf's and salt tabs were continued (1 gm bid). Serum Na came up some to 122 today.   Patient was here in Aug 2012 with severe hyponatremia treated with stopping HCTZ and giving NS Patient was here in Dec 2012 with Na 118, dx'Harmon with SIADH and treated with fluid restriction, demeclocycline and salt tablets. Na improved to 129.  Patient was here in April 2013 with psychosis, T/C seizures and Na 120.  Methotrexate was stopped (assoc with SIADH), and she was treated with 3% NaCL and Na improved to 126.  Discharged on salt tabs 1 gm bid.  Patient is not answering questions. She is confused. Urine osm returned at  563, and urine Na is 69.    Sodium  Date/Time Value Range Status  11/13/2011  3:40 PM 122* 135-145 (mEq/L) Final  11/13/2011  8:00 AM 121* 135-145 (mEq/L) Final  11/12/2011 11:00 PM 121* 135-145 (mEq/L) Final  11/12/2011  3:59 PM 121* 135-145 (mEq/L) Final  11/12/2011  7:10 AM 122* 135-145 (mEq/L) Final  11/11/2011 11:57 PM 119* 135-145 (mEq/L) Final     REPEATED TO VERIFY     CRITICAL RESULT CALLED TO, READ BACK BY AND VERIFIED WITH:     TinaSTEVENSON RN AT 0135 ON [id number] BY DLONG  11/11/2011  6:13 PM 121* 135-145 (mEq/L) Final  10/28/2011  5:20 AM 128* 135-145 (mEq/L) Final  10/27/2011  5:54 PM 126* 135-145 (mEq/L) Final  10/27/2011  2:25 PM 127* 135-145 (mEq/L) Final  10/27/2011  9:15 AM 128* 135-145 (mEq/L) Final  10/27/2011  5:07 AM 127* 135-145 (mEq/L) Final  10/26/2011  6:20 PM 126* 135-145 (mEq/L) Final  10/26/2011  8:32 AM 127* 135-145 (mEq/L) Final  10/26/2011  3:31 AM 129* 135-145 (mEq/L) Final  10/25/2011  6:27 PM 126* 135-145 (mEq/L)  Final  10/25/2011  2:25 PM 127* 135-145 (mEq/L) Final  10/25/2011 10:45 AM 124* 135-145 (mEq/L) Final  10/25/2011  6:00 AM 121* 135-145 (mEq/L) Final  10/24/2011  9:16 PM 120* 135-145 (mEq/L) Final  06/20/2011  5:01 AM 129* 135-145 (mEq/L) Final  06/19/2011  4:30 PM 127* 135-145 (mEq/L) Final  06/19/2011 12:46 PM 121* 135-145 (mEq/L) Final  06/19/2011  7:48 AM 124* 135-145 (mEq/L) Final  06/19/2011  4:48 AM 124* 135-145 (mEq/L) Final  06/19/2011 12:09 AM 123* 135-145 (mEq/L) Final  06/18/2011  8:27 PM 124* 135-145 (mEq/L) Final  06/18/2011  5:18 PM 122* 135-145 (mEq/L) Final  06/18/2011 12:25 PM 120* 135-145 (mEq/L) Final  06/18/2011  4:34 AM 124* 135-145 (mEq/L) Final  06/17/2011 11:36 PM 122* 135-145 (mEq/L) Final  06/17/2011  7:26 PM 120* 135-145 (mEq/L) Final  06/17/2011  4:15 PM 121* 135-145 (mEq/L) Final  06/17/2011 11:41 AM 124* 135-145 (mEq/L) Final  06/17/2011  7:51 AM 124* 135-145 (mEq/L) Final  06/17/2011  3:55 AM 122* 135-145 (mEq/L) Final  06/16/2011  8:04 PM 121* 135-145 (mEq/L) Final  06/16/2011  2:27 PM 122* 135-145 (mEq/L) Final  06/16/2011  4:44 AM 123* 135-145 (mEq/L) Final  06/15/2011  4:53 AM 125* 135-145 (mEq/L) Final     DELTA CHECK NOTED  06/14/2011 11:21 AM 118* 135-145 (mEq/L) Final     CRITICAL  RESULT CALLED TO, READ BACK BY AND VERIFIED WITH:     DANIELS,Harmon AT 12:00PM ON 06/14/11 BY [facility]  02/22/2011 10:05 AM 130* 135-145 (mEq/L) Final  02/22/2011  3:52 AM 128* 135-145 (mEq/L) Final  02/21/2011  9:58 PM 127* 135-145 (mEq/L) Final  02/21/2011  4:12 PM 129* 135-145 (mEq/L) Final  02/21/2011 10:23 AM 128* 135-145 (mEq/L) Final  02/21/2011  6:37 AM 128* 135-145 (mEq/L) Final  02/21/2011 12:00 AM 126* 135-145 (mEq/L) Final  02/20/2011  4:00 PM 125* 135-145 (mEq/L) Final  02/20/2011 10:17 AM 125* 135-145 (mEq/L) Final  02/20/2011  6:29 AM 125* 135-145 (mEq/L) Final  02/19/2011 10:42 PM 123* 135-145 (mEq/L) Final    Past Medical History:  Past Medical History    Diagnosis Date  . Hypertension   . Dysrhythmia   . Rheumatoid arthritis     With known cervical subluxation  . Fall 06/01/11    twice this year  . Incontinence of urine     wears depends  . 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 Psychoses 05/2011    Hallucinations and AMS in setting of low Na, UTI, and steroid use     Past Surgical History:  Past Surgical History  Procedure Date  . Toe surgery     great toe bilateral feet due to arthrits    Family History: History reviewed. No pertinent family history. Social History:  reports that she has never smoked. She has never used smokeless tobacco. She reports that she does not drink alcohol or use illicit drugs.  Allergies:  Allergies  Allergen Reactions  . Codeine Anxiety  . Colchicine Anxiety  . Famotidine Anxiety  . Penicillins Anxiety    Home medications: Prior to Admission medications   Medication Sig Start Date End Date Taking? Authorizing Provider  cholecalciferol (VITAMIN Harmon) 1000 UNITS tablet Take 1,000 Units by mouth daily.     Yes Historical Provider, MD  ferrous fumarate (HEMOCYTE - 106 MG FE) 325 (106 FE) MG TABS Take 1 tablet by mouth daily.    Yes Historical Provider, MD  folic acid (FOLVITE) 1 MG tablet Take 1 mg by mouth daily.     Yes Historical Provider, MD  LORazepam (ATIVAN IJ) Inject 1 Syringe as directed once.   Yes Historical Provider, MD  phenytoin (DILANTIN) 200 MG ER capsule Take 1 capsule (200 mg total) by mouth at bedtime. 10/29/11 10/28/12 Yes Tina Pia, MD  senna-docusate (SENOKOT-S) 8.6-50 MG per tablet Take 1 tablet by mouth at bedtime. 06/20/11 06/19/12 Yes Hind I Elsaid, MD  sodium chloride 1 G tablet Take 1 tablet (1 g total) by mouth 2 (two) times daily with a meal. 10/28/11 10/27/12 Yes Tina Pia, MD  vitamin C (ASCORBIC ACID) 500 MG tablet Take 500 mg by mouth daily.     Yes Historical Provider, MD    Inpatient medications:    . ciprofloxacin  200 mg Intravenous Q12H  .  docusate sodium  100 mg Oral BID  . enoxaparin  40 mg Subcutaneous QHS  . folic acid  1 mg Oral Daily  . phenytoin  200 mg Oral QHS  . senna  1 tablet Oral BID  . sodium chloride  3 mL Intravenous Q12H  . sodium chloride  1 g Oral TID WC    Review of Systems Patient does not respond to questions.   Labs: Basic Metabolic Panel:  Lab 11/13/11 1610 11/13/11 0800 11/12/11 2300 11/12/11 1559 11/12/11 0710 11/11/11 2357 11/11/11 1813  NA 122* 121* 121* 121* 122* 119* 121*  K 3.5 4.0 4.3 4.1 4.2 3.8 4.0  CL 91* 88* 89* 89* 88* 85* 87*  CO2 23 25 25 24 25 25 24   GLUCOSE 158* 91 115* 90 79 97 93  BUN 6 6 9 11 12 14 17   CREATININE 0.41* 0.41* 0.43* 0.47* 0.50 0.47* 0.48*  ALB -- -- -- -- -- -- --  CALCIUM 8.3* 8.7 8.4 8.5 8.8 9.1 9.0  PHOS -- -- -- -- -- -- --   CBC:  Lab 11/13/11 1540 11/11/11 1813  WBC 2.1* 4.6  NEUTROABS -- --  HGB 10.5* 11.3*  HCT 30.2* 31.2*  MCV 90.4 87.9  PLT 313 311    Xrays/Other Studies: Dg Chest Port 1 View  11/11/2011  *RADIOLOGY REPORT*  Clinical Data: Altered mental status  PORTABLE CHEST - 1 VIEW  Comparison: 06/14/2011  Findings: High-riding right greater than left humeral head could signify rotator cuff tendinopathy or could be positional.  Heart size is normal.  Cardiac leads obscure detail. Aorta is ectatic and unfolded.  No focal pulmonary opacity.  No pleural effusion. Right- sided skin fold is noted but no pneumothorax is seen.  IMPRESSION: No acute cardiopulmonary process.  Original Report Authenticated By: Harrel Lemon, M.Harmon.    Physical Exam:  Blood pressure 131/80, pulse 69, temperature 98.6 F (37 C), temperature source Oral, resp. rate 18, height 5\' 2"  (1.575 m), weight 51.3 kg (113 lb 1.5 oz), SpO2 98.00%.  Gen: thin, cachectic, strange affect, hollers from time to time, does not make appropriate verbal responses Skin: no rash, cyanosis Neck: no JVD, no bruits or LAN Chest: clear bilat to bases, no rales or wheezing Heart:  regular, no rub or gallop Abdomen: soft, scaphoid, no ascites Ext: no edema Neuro: alert, Ox3, no focal deficit Heme/Lymph: no bruising or LAN   Assessment/Recommendations 1. Hyponatremia due to SIADH- underlying cause is usually CNS (psychosis, CVA, etc), pulmonary or medications.  She does not have any pulmonary disease but does have chronic psychosis which may be the cause.  She has been taken off of HCTZ and methotrexate on different occasions in the past year for hyponatremia, but she is still having problems.  I would favor continuing treatment with salt tablets but increasing the dose to get sodium levels in the high 120's or low 130's.  She is on NaCL 1 gm bid- the maximum dose I've seen is 3 gm tid, so we will increase to 2 gm tid for now and then decrease or increase further as needed.  She does not require 3% saline since her Na is over 120 and she is not obviously symptomatic. With high urine osmolality, giving normal saline can worsen water retention, so will stop IVF's.  Fluid restriction to less than 1500cc/day is also recommended.  Avoid drugs known to exacerbate SIADH (SSRI's, thiazides, others).  Thanks for the referral, will follow.  2. UTI 3. Severe RA 4. Chronic psychosis  Tina Moselle  MD [facility] [phone] pgr    [phone] cell 11/13/2011, 7:54 PM

## 2011-11-13 NOTE — Progress Notes (Signed)
CSW completed FL2 and place on wall chart for MD signature. Sticky note for MD in EPIC.   Leron Croak, LCSWA [employer] [phone]

## 2011-11-13 NOTE — Evaluation (Signed)
Physical Therapy Evaluation Patient Details Name: [name] MRN: [id number] DOB: Apr 09, 1930 Today's Date: 11/13/2011 Time: 7829-5621 PT Time Calculation (min): 25 min  PT Assessment / Plan / Recommendation Clinical Impression  Pt with hx of RA, cervical subluxation and hyponatremia admitted with UTI and lower than baseline sodium.  Pt has decreased interaction with PT and reqiuires significant assist of 2 for bed mobility and transfers.  She will benefit from continued PT at SNF at D/C    PT Assessment  Patient needs continued PT services    Follow Up Recommendations  Skilled nursing facility    Barriers to Discharge        lEquipment Recommendations  Defer to next venue    Recommendations for Other Services OT consult   Frequency Min 3X/week    Precautions / Restrictions Precautions Precautions: Fall Restrictions Weight Bearing Restrictions: No   Pertinent Vitals/Pain Pt with no indication of pain; Overall very limited interactions      Mobility  Bed Mobility Bed Mobility: Supine to Sit Supine to Sit: 1: +2 Total assist;HOB elevated Supine to Sit: Patient Percentage: 20% Details for Bed Mobility Assistance: pt  needed assist to move legs to EOB and also to lift upper body off bed.  Transfers Transfers: Sit to Stand;Stand to Sit Sit to Stand: 1: +2 Total assist;Without upper extremity assist;From bed;From chair/3-in-1 (pt found to be incontinent of BM) Sit to Stand: Patient Percentage: 30% Stand to Sit: 1: +2 Total assist;With armrests;To chair/3-in-1 Stand to Sit: Patient Percentage: 30% Details for Transfer Assistance: assist given at rib cage to protect arthritic UE.s Pt had difficulty standing all the way up. needed total assist to pivot to [facility]  Ambulation/Gait Ambulation/Gait Assistance: 1: +2 Total assist Ambulation/Gait: Patient Percentage: 30% Ambulation Distance (Feet): 2 Feet Assistive device: 2 person hand held assist Ambulation/Gait Assistance  Details: pt had diffuclty standing erect, but agreed to sit in chair after finishing on BSC. She was able to stand better and move to chair Gait Pattern: Step-to pattern;Shuffle Gait velocity: slow General Gait Details: pt has difficulty initiating any movement  Stairs: No Wheelchair Mobility Wheelchair Mobility: No    Exercises     PT Diagnosis: Abnormality of gait;Generalized weakness;Acute pain;Altered mental status;Difficulty walking  PT Problem List: Decreased strength;Decreased activity tolerance;Decreased balance;Decreased mobility;Decreased coordination;Decreased cognition;Decreased knowledge of use of DME;Decreased safety awareness;Decreased knowledge of precautions;Pain PT Treatment Interventions: DME instruction;Gait training;Functional mobility training;Therapeutic activities;Therapeutic exercise;Balance training;Patient/family education;Cognitive remediation;Neuromuscular re-education   PT Goals Acute Rehab PT Goals PT Goal Formulation: Patient unable to participate in goal setting Time For Goal Achievement: 11/27/11 Potential to Achieve Goals: Fair Pt will go Supine/Side to Sit: with min assist PT Goal: Supine/Side to Sit - Progress: Goal set today Pt will go Sit to Supine/Side: with min assist PT Goal: Sit to Supine/Side - Progress: Goal set today Pt will go Sit to Stand: with supervision PT Goal: Sit to Stand - Progress: Goal set today Pt will go Stand to Sit: with min assist PT Goal: Stand to Sit - Progress: Goal set today Pt will Ambulate: 51 - 150 feet;with min assist;with least restrictive assistive device PT Goal: Ambulate - Progress: Goal set today  Visit Information  Last PT Received On: 11/13/11 Assistance Needed: +2    Subjective Data  Subjective: Pt mostly nonverbal. Will only have eye contact briefly when stimulated to be more alert Patient Stated Goal: did not state   Prior Functioning  Home Living Available Help at Discharge: Skilled Nursing  Facility Type  of Home: Skilled Nursing Facility Prior Function Level of Independence:  (unable to assess) Communication Communication: No difficulties (pt can speak, but it appears she chooses not to)    Cognition  Overall Cognitive Status: History of cognitive impairments - further impaired Arousal/Alertness: Other (comment) (awake, but not fully alert.stares off or closes eyes at time) Behavior During Session: Flat affect Following Commands: Follows one step commands inconsistently Safety/Judgement: Decreased awareness of safety precautions;Decreased safety judgement for tasks assessed;Decreased awareness of need for assistance    Extremity/Trunk Assessment Right Upper Extremity Assessment RUE ROM/Strength/Tone: Deficits RUE ROM/Strength/Tone Deficits: deficits due to RA, seems to be at baseline with UE's Left Upper Extremity Assessment LUE ROM/Strength/Tone: Deficits LUE ROM/Strength/Tone Deficits: see RUE note Right Lower Extremity Assessment RLE ROM/Strength/Tone: Deficits;Unable to fully assess RLE ROM/Strength/Tone Deficits: could not fully assess due to pt not following all commands, noted weakness with transfers and ambulation, decreased quad strength, hip flex 3/5 RLE Sensation: Deficits RLE Sensation Deficits: poor proprioception RLE Coordination: Deficits RLE Coordination Deficits: poor coordination of mvmt with ambulation Left Lower Extremity Assessment LLE ROM/Strength/Tone: Deficits;Unable to fully assess LLE ROM/Strength/Tone Deficits: unable to fully assess due to cognition but deficits noted with sit to stand and ambulation LLE Sensation: Deficits LLE Sensation Deficits: poor proprioception LLE Coordination: Deficits LLE Coordination Deficits: poor LE coordination with gait Trunk Assessment Trunk Assessment: Kyphotic   Balance Balance Balance Assessed: No  End of Session PT - End of Session Activity Tolerance: Other (comment) (limited by decreased interaction  and particpation with PT) Patient left: in chair;with chair alarm set Nurse Communication: Mobility status   Donnetta Hail 11/13/2011, 4:03 PM

## 2011-11-13 NOTE — Progress Notes (Signed)
CSW attempted to speak with Pt regarding D/C plan. Pt was unable to respond at this time.   CSW contact both son Channing Mutters and L[place]). Both are agreeable for Pt to return to [facility].  Per facility ok for Pt to return when medically ready.   CSW weekday to F/U.    Leron Croak, LCSWA [employer] [phone]

## 2011-11-13 NOTE — Progress Notes (Signed)
Subjective: Patient is confused.  No specific complaints.  Objective: Vital signs in last 24 hours: Filed Vitals:   11/12/11 0706 11/12/11 1414 11/12/11 2205 11/13/11 0700  BP: 108/62 118/71 129/72 152/74  Pulse: 72 66 72 60  Temp: 98.5 F (36.9 C) 98.9 F (37.2 C) 98.4 F (36.9 C) 97.8 F (36.6 C)  TempSrc: Oral Oral Oral Oral  Resp: 16 16 16 16   Height:      Weight:      SpO2: 100% 98% 97% 99%   Weight change:   Intake/Output Summary (Last 24 hours) at 11/13/11 1332 Last data filed at 11/13/11 0500  Gross per 24 hour  Intake 1241.25 ml  Output      0 ml  Net 1241.25 ml    Physical Exam: General: Awake, not oriented x3, No acute distress. HEENT: EOMI. Neck: Supple CV: S1 and S2 Lungs: Clear to ascultation bilaterally Abdomen: Soft, Nontender, Nondistended, +bowel sounds. Ext: Good pulses. Trace edema.  Lab Results:  Basename 11/13/11 0800 11/12/11 2300  NA 121* 121*  K 4.0 4.3  CL 88* 89*  CO2 25 25  GLUCOSE 91 115*  BUN 6 9  CREATININE 0.41* 0.43*  CALCIUM 8.7 8.4  MG -- --  PHOS -- --   No results found for this basename: AST:2,ALT:2,ALKPHOS:2,BILITOT:2,PROT:2,ALBUMIN:2 in the last 72 hours No results found for this basename: LIPASE:2,AMYLASE:2 in the last 72 hours  Basename 11/11/11 1813  WBC 4.6  NEUTROABS --  HGB 11.3*  HCT 31.2*  MCV 87.9  PLT 311   No results found for this basename: CKTOTAL:3,CKMB:3,CKMBINDEX:3,TROPONINI:3 in the last 72 hours No components found with this basename: POCBNP:3 No results found for this basename: DDIMER:2 in the last 72 hours No results found for this basename: HGBA1C:2 in the last 72 hours No results found for this basename: CHOL:2,HDL:2,LDLCALC:2,TRIG:2,CHOLHDL:2,LDLDIRECT:2 in the last 72 hours No results found for this basename: TSH,T4TOTAL,FREET3,T3FREE,THYROIDAB in the last 72 hours No results found for this basename: VITAMINB12:2,FOLATE:2,FERRITIN:2,TIBC:2,IRON:2,RETICCTPCT:2 in the last 72  hours  Micro Results: Recent Results (from the past 240 hour(s))  URINE CULTURE     Status: Normal (Preliminary result)   Collection Time   11/11/11  6:31 PM      Component Value Range Status Comment   Specimen Description URINE, RANDOM   Final    Special Requests NONE   Final    Culture  Setup Time [id number]   Final    Colony Count >=100,000 COLONIES/ML   Final    Culture GRAM NEGATIVE RODS   Final    Report Status PENDING   Incomplete     Studies/Results: Dg Chest Port 1 View  11/11/2011  *RADIOLOGY REPORT*  Clinical Data: Altered mental status  PORTABLE CHEST - 1 VIEW  Comparison: 06/14/2011  Findings: High-riding right greater than left humeral head could signify rotator cuff tendinopathy or could be positional.  Heart size is normal.  Cardiac leads obscure detail. Aorta is ectatic and unfolded.  No focal pulmonary opacity.  No pleural effusion. Right- sided skin fold is noted but no pneumothorax is seen.  IMPRESSION: No acute cardiopulmonary process.  Original Report Authenticated By: Harrel Lemon, M.D.    Medications: I have reviewed the patient's current medications. Scheduled Meds:    . ciprofloxacin  200 mg Intravenous Q12H  . docusate sodium  100 mg Oral BID  . enoxaparin  40 mg Subcutaneous QHS  . folic acid  1 mg Oral Daily  . phenytoin  200 mg Oral QHS  .  senna  1 tablet Oral BID  . sodium chloride  3 mL Intravenous Q12H  . sodium chloride  1 g Oral TID WC  . DISCONTD: sodium chloride  3 mL Intravenous Q12H  . DISCONTD: sodium chloride  1 g Oral BID WC   Continuous Infusions:    . sodium chloride 75 mL/hr at 11/12/11 2046   PRN Meds:.acetaminophen, acetaminophen, ondansetron (ZOFRAN) IV, ondansetron, DISCONTD: sodium chloride, DISCONTD: sodium chloride  Assessment/Plan: Altered mental status/acute delirium/metabolic encephalopathy Etiology unclear.  Likely due to urinary tract infection and hyponatremia.  Patient had MRI of the brain on 10/25/2011  which showed no acute infarct.  Continue treatment for urinary tract infection.  Suspect as hyponatremia improves mentation likely to improve.  Continue to monitor.  Recurrent hyponatremia Etiology unclear.  Studies suggest SIADH.  Fluid restrict the patient to 1.2 L of normal saline.  Continue normal saline.  Continue salt tablets.  Renal consult, appreciate their input.  Discussed with renal, phenytoin not likely to cause hyponatremia.  Urinary tract infection Continue ciprofloxacin.  Antibiotics since 11/11/2011.  Urine culture growing gram-negative rods.  History of seizures from hyponatremia Continue Dilantin.  History of rheumatoid arthritis Methotrexate has been discontinued.  Continue Folate.  Hypertension Stable.  Not on any antihypertensive medications.  Anemia Thought to be due to iron deficiency.  Supplemental iron held.  Hyperlipidemia Stable.  History of cervical subluxation Unchanged from one year ago.  Prophylaxis Lovenox.  CODE STATUS Full code per son.    Disposition Pending.  Son updated over the telephone.  Son can be reached at [facility] [phone] or [phone].   LOS: 2 days  Aivan Fillingim A, MD 11/13/2011, 1:32 PM

## 2011-11-13 NOTE — Progress Notes (Signed)
Clinical Social Work Department BRIEF PSYCHOSOCIAL ASSESSMENT 11/13/2011  Patient:  [name], [name]     Account Number:  [id number]     Admit date:  11/11/2011  Clinical Social Worker:  Leron Croak, CLINICAL SOCIAL WORKER  Date/Time:  11/13/2011 03:38 PM  Referred by:  Physician  Date Referred:  11/13/2011 Referred for  SNF Placement   Other Referral:   Interview type:  Family Other interview type:    PSYCHOSOCIAL DATA Living Status:  FACILITY Admitted from facility:  [facility] [place] Level of care:  Skilled Nursing Facility Primary support name:  Channing Mutters and [name] Primary support relationship to patient:  CHILD, ADULT Degree of support available:   good    CURRENT CONCERNS Current Concerns  Post-Acute Placement   Other Concerns:    SOCIAL WORK ASSESSMENT / PLAN CSW was unable to speak with Pt at this time due to mental staus. CSW contacted both son's concerning d/c plan back to [facility]. Both agreed to d/c plan.   Assessment/plan status:  Information/Referral to [facility] Other assessment/ plan:   Information/referral to community resources:   No information given other than d/c plan to prior facility.    PATIENT'S/FAMILY'S RESPONSE TO PLAN OF CARE: Pt son's were agreeable and appreciative to d/c plan back to prior facility [facility] CSW weekday to f/u.     Leron Croak, LCSWA [employer] [phone]

## 2011-11-14 DIAGNOSIS — R197 Diarrhea, unspecified: Secondary | ICD-10-CM

## 2011-11-14 DIAGNOSIS — M069 Rheumatoid arthritis, unspecified: Secondary | ICD-10-CM

## 2011-11-14 DIAGNOSIS — E782 Mixed hyperlipidemia: Secondary | ICD-10-CM

## 2011-11-14 DIAGNOSIS — E236 Other disorders of pituitary gland: Secondary | ICD-10-CM

## 2011-11-14 LAB — BASIC METABOLIC PANEL
BUN: 6 mg/dL (ref 6–23)
BUN: 4 mg/dL — ABNORMAL LOW (ref 6–23)
BUN: 4 mg/dL — ABNORMAL LOW (ref 6–23)
CO2: 25 mEq/L (ref 19–32)
CO2: 24 mEq/L (ref 19–32)
CO2: 26 mEq/L (ref 19–32)
Calcium: 8.3 mg/dL — ABNORMAL LOW (ref 8.4–10.5)
Calcium: 8.5 mg/dL (ref 8.4–10.5)
Calcium: 8.7 mg/dL (ref 8.4–10.5)
Chloride: 94 mEq/L — ABNORMAL LOW (ref 96–112)
Creatinine, Ser: 0.43 mg/dL — ABNORMAL LOW (ref 0.50–1.10)
Creatinine, Ser: 0.42 mg/dL — ABNORMAL LOW (ref 0.50–1.10)
Creatinine, Ser: 0.41 mg/dL — ABNORMAL LOW (ref 0.50–1.10)
GFR calc Af Amer: >90 mL/min (ref >90)
GFR calc Af Amer: >90 mL/min (ref >90)
GFR calc non Af Amer: >90 mL/min (ref >90)
GFR calc non Af Amer: >90 mL/min (ref >90)
GFR calc non Af Amer: >90 mL/min (ref >90)
Glucose, Bld: 88 mg/dL (ref 70–99)
Glucose, Bld: 96 mg/dL (ref 70–99)
Glucose, Bld: 99 mg/dL (ref 70–99)
Potassium: 3.8 mEq/L (ref 3.5–5.1)
Sodium: 125 mEq/L — ABNORMAL LOW (ref 135–145)

## 2011-11-14 LAB — CBC
HCT: 29.9 % — ABNORMAL LOW (ref 36.0–46.0)
Hemoglobin: 10.6 g/dL — ABNORMAL LOW (ref 12.0–15.0)
MCH: 31.6 pg (ref 26.0–34.0)
MCHC: 35.5 g/dL (ref 30.0–36.0)
MCV: 89.3 fL (ref 78.0–100.0)
RBC: 3.35 MIL/uL — ABNORMAL LOW (ref 3.87–5.11)

## 2011-11-14 NOTE — Progress Notes (Signed)
Subjective: Patient still pleasantly confused.  No other specific complaints.  Son by bedside.  Objective: Vital signs in last 24 hours: Filed Vitals:   11/13/11 0700 11/13/11 1407 11/13/11 2224 11/14/11 0700  BP: 152/74 131/80 148/80 168/77  Pulse: 60 69 71 62  Temp: 97.8 F (36.6 C) 98.6 F (37 C) 97.8 F (36.6 C) 97.8 F (36.6 C)  TempSrc: Oral Oral Oral Oral  Resp: 16 18  20   Height:      Weight:      SpO2: 99% 98% 97% 97%   Weight change:   Intake/Output Summary (Last 24 hours) at 11/14/11 1046 Last data filed at 11/13/11 1408  Gross per 24 hour  Intake    240 ml  Output      0 ml  Net    240 ml    Physical Exam: General: Awake, not oriented x3, No acute distress. HEENT: EOMI. Neck: Supple CV: S1 and S2 Lungs: Clear to ascultation bilaterally Abdomen: Soft, Nontender, Nondistended, +bowel sounds. Ext: Good pulses. Trace edema.  Lab Results:  Basename 11/14/11 0540 11/13/11 2327  NA 126* 125*  K 3.8 3.8  CL 95* 95*  CO2 23 25  GLUCOSE 96 88  BUN 4* 6  CREATININE 0.43* 0.40*  CALCIUM 8.7 8.3*  MG -- --  PHOS -- --   No results found for this basename: AST:2,ALT:2,ALKPHOS:2,BILITOT:2,PROT:2,ALBUMIN:2 in the last 72 hours No results found for this basename: LIPASE:2,AMYLASE:2 in the last 72 hours  Basename 11/14/11 0540 11/13/11 1540  WBC 2.2* 2.1*  NEUTROABS -- --  HGB 10.6* 10.5*  HCT 29.9* 30.2*  MCV 89.3 90.4  PLT 330 313   No results found for this basename: CKTOTAL:3,CKMB:3,CKMBINDEX:3,TROPONINI:3 in the last 72 hours No components found with this basename: POCBNP:3 No results found for this basename: DDIMER:2 in the last 72 hours No results found for this basename: HGBA1C:2 in the last 72 hours No results found for this basename: CHOL:2,HDL:2,LDLCALC:2,TRIG:2,CHOLHDL:2,LDLDIRECT:2 in the last 72 hours No results found for this basename: TSH,T4TOTAL,FREET3,T3FREE,THYROIDAB in the last 72 hours No results found for this basename:  VITAMINB12:2,FOLATE:2,FERRITIN:2,TIBC:2,IRON:2,RETICCTPCT:2 in the last 72 hours  Micro Results: Recent Results (from the past 240 hour(s))  URINE CULTURE     Status: Normal (Preliminary result)   Collection Time   11/11/11  6:31 PM      Component Value Range Status Comment   Specimen Description URINE, RANDOM   Final    Special Requests NONE   Final    Culture  Setup Time [id number]   Final    Colony Count >=100,000 COLONIES/ML   Final    Culture GRAM NEGATIVE RODS   Final    Report Status PENDING   Incomplete     Studies/Results: No results found.  Medications: I have reviewed the patient's current medications. Scheduled Meds:    . ciprofloxacin  200 mg Intravenous Q12H  . docusate sodium  100 mg Oral BID  . enoxaparin  40 mg Subcutaneous QHS  . folic acid  1 mg Oral Daily  . phenytoin  200 mg Oral QHS  . senna  1 tablet Oral BID  . sodium chloride  3 mL Intravenous Q12H  . sodium chloride  2 g Oral TID WC  . DISCONTD: sodium chloride  1 g Oral TID WC   Continuous Infusions:    . DISCONTD: sodium chloride 75 mL/hr at 11/13/11 1504   PRN Meds:.acetaminophen, acetaminophen, ondansetron (ZOFRAN) IV, ondansetron  Assessment/Plan: Altered mental status/acute delirium/metabolic encephalopathy Etiology unclear.  Likely due to urinary tract infection and hyponatremia.  Patient had MRI of the brain on 10/25/2011 which showed no acute infarct.  Continue treatment for urinary tract infection.  Suspect as hyponatremia improves mentation likely to improve.  Continue to monitor.  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  Fluid restrict the patient to 1.2 L of normal saline.  Continue normal saline.  Continue salt tablets, dose increased on 11/13/2011.  Renal input appreciated.  Urinary tract infection Continue ciprofloxacin.  Antibiotics since 11/11/2011.  Urine culture growing gram-negative rods.  History of seizures from hyponatremia Continue Dilantin.  History of  rheumatoid arthritis Methotrexate has been discontinued.  Continue Folate.  Hypertension Stable.  Not on any antihypertensive medications.  Anemia Thought to be due to iron deficiency.  Supplemental iron held.  Hyperlipidemia Stable.  History of cervical subluxation Unchanged from one year ago.  Prophylaxis Lovenox.  CODE STATUS Full code.    Disposition Pending.  Son [name] updated by bedside. [name] can be reached at [facility] [phone] or [phone].   LOS: 3 days  Tylin Stradley A, MD 11/14/2011, 10:46 AM

## 2011-11-14 NOTE — Progress Notes (Signed)
Subjective: Na level up to 126 today.   Objective Vital signs in last 24 hours: Filed Vitals:   11/13/11 1407 11/13/11 2224 11/14/11 0700 11/14/11 1418  BP: 131/80 148/80 168/77 153/76  Pulse: 69 71 62 82  Temp: 98.6 F (37 C) 97.8 F (36.6 C) 97.8 F (36.6 C) 97.6 F (36.4 C)  TempSrc: Oral Oral Oral Oral  Resp: 18  20 18   Height:      Weight:      SpO2: 98% 97% 97% 99%   Weight change:   Intake/Output Summary (Last 24 hours) at 11/14/11 1548 Last data filed at 11/14/11 1419  Gross per 24 hour  Intake      0 ml  Output      0 ml  Net      0 ml   Labs: Basic Metabolic Panel:  Lab 11/14/11 1610 11/13/11 2327 11/13/11 1540 11/13/11 0800 11/12/11 2300 11/12/11 1559 11/12/11 0710  NA 126* 125* 122* 121* 121* 121* 122*  K 3.8 3.8 3.5 4.0 4.3 4.1 4.2  CL 95* 95* 91* 88* 89* 89* 88*  CO2 23 25 23 25 25 24 25   GLUCOSE 96 88 158* 91 115* 90 79  BUN 4* 6 6 6 9 11 12   CREATININE 0.43* 0.40* 0.41* 0.41* 0.43* 0.47* 0.50  ALB -- -- -- -- -- -- --  CALCIUM 8.7 8.3* 8.3* 8.7 8.4 8.5 8.8  PHOS -- -- -- -- -- -- --   Liver Function Tests: No results found for this basename: AST:3,ALT:3,ALKPHOS:3,BILITOT:3,PROT:3,ALBUMIN:3 in the last 168 hours No results found for this basename: LIPASE:3,AMYLASE:3 in the last 168 hours No results found for this basename: AMMONIA:3 in the last 168 hours CBC:  Lab 11/14/11 0540 11/13/11 1540 11/11/11 1813  WBC 2.2* 2.1* 4.6  NEUTROABS -- -- --  HGB 10.6* 10.5* 11.3*  HCT 29.9* 30.2* 31.2*  MCV 89.3 90.4 87.9  PLT 330 313 311   PT/INR: @labrcntip (inr:5) Cardiac Enzymes: No results found for this basename: CKTOTAL:5,CKMB:5,CKMBINDEX:5,TROPONINI:5 in the last 168 hours CBG: No results found for this basename: GLUCAP:5 in the last 168 hours  Iron Studies: No results found for this basename: IRON:30,TIBC:30,TRANSFERRIN:30,FERRITIN:30 in the last 168 hours Studies/Results: No results found. Medications:    . DISCONTD: sodium chloride 75  mL/hr at 11/13/11 1504      . ciprofloxacin  200 mg Intravenous Q12H  . docusate sodium  100 mg Oral BID  . enoxaparin  40 mg Subcutaneous QHS  . folic acid  1 mg Oral Daily  . phenytoin  200 mg Oral QHS  . senna  1 tablet Oral BID  . sodium chloride  3 mL Intravenous Q12H  . sodium chloride  2 g Oral TID WC  . DISCONTD: sodium chloride  1 g Oral TID WC    I  have reviewed scheduled and prn medications.  Physical Exam:  Blood pressure 153/76, pulse 82, temperature 97.6 F (36.4 C), temperature source Oral, resp. rate 18, height 5\' 2"  (1.575 m), weight 51.3 kg (113 lb 1.5 oz), SpO2 99.00%.  Gen: thin, cachectic, strange affect, hollers from time to time, does not make appropriate verbal responses  Skin: no rash, cyanosis  Neck: no JVD, no bruits or LAN  Chest: clear bilat to bases, no rales or wheezing  Heart: regular, no rub or gallop  Abdomen: soft, scaphoid, no ascites  Ext: no edema  Neuro: alert, Ox3, no focal deficit  Heme/Lymph: no bruising or LAN   Assessment/Recommendations  1.  Hyponatremia due to SIADH- improving with salt tabs 2 gm tid.  Was on 1 gm bid prior. Would continue this dose until Na over 130 then decrease to 2 gm bid.  You can titrate as needed within the range of 3 to 9 gm total a day with bid or tid dosing. Goal sodium could be high 120's or low 130's.  Avoid SSRI's, methotrexate. Will sign off, please call as needed.  2. UTI 3. Severe RA 4. Chronic psychosis   Vinson Moselle  MD [facility] [phone] pgr    [phone] cell 11/14/2011, 3:48 PM

## 2011-11-14 NOTE — Progress Notes (Signed)
Noted pt from SNF returning to SNF. Will defer OT eval to SNF. Thanks,  Lise Auer

## 2011-11-14 NOTE — ED Provider Notes (Signed)
I saw and evaluated the patient, reviewed the resident's note and I agree with the findings and plan. Pt with hx altered ms, hyponatremia from ecf w altered ms. Hyponatremia returns. Iv ns. Pt alert, content, confused. abd soft nt. Chest cta.  Suzi Roots, MD 11/14/11 1750

## 2011-11-14 NOTE — Care Management Note (Signed)
    Page 1 of 1   11/25/2011     2:22:13 PM   CARE MANAGEMENT NOTE 11/25/2011  Patient:  [name], [name]   Account Number:  [id number]  Date Initiated:  11/14/2011  Documentation initiated by:  Lanier Clam  Subjective/Objective Assessment:   ADMITTED W/AMS,UTI     Action/Plan:   FROM SNF-[facility]   Anticipated DC Date:  11/25/2011   Anticipated DC Plan:  SKILLED NURSING FACILITY  In-house referral  Clinical Social Worker      DC Planning Services  CM consult      Choice offered to / List presented to:             Status of service:  Completed, signed off Medicare Important Message given?   (If response is "NO", the following Medicare IM given date fields will be blank) Date Medicare IM given:   Date Additional Medicare IM given:    Discharge Disposition:  SKILLED NURSING FACILITY  Per UR Regulation:  Reviewed for med. necessity/level of care/duration of stay  If discussed at Long Length of Stay Meetings, dates discussed:   11/23/2011    Comments:  11/25/11 Lindee Leason RN,BSN NCM [phone] D/C SNF  11/22/11 Nami Strawder RN,BSN NCM [phone] CONFUSION/AGITATION.D/C PLAN SNF.  11/17/11 Jerica Creegan RN,BSN NCM [phone] NEPHROLOGY FOLLOWING-LOW NA.  11/14/11 Rubi Tooley RN,BSN NCM [phone]

## 2011-11-15 ENCOUNTER — Inpatient Hospital Stay (HOSPITAL_COMMUNITY): Payer: Medicare Other

## 2011-11-15 DIAGNOSIS — E236 Other disorders of pituitary gland: Secondary | ICD-10-CM

## 2011-11-15 DIAGNOSIS — E782 Mixed hyperlipidemia: Secondary | ICD-10-CM

## 2011-11-15 DIAGNOSIS — M069 Rheumatoid arthritis, unspecified: Secondary | ICD-10-CM

## 2011-11-15 DIAGNOSIS — R197 Diarrhea, unspecified: Secondary | ICD-10-CM

## 2011-11-15 LAB — BASIC METABOLIC PANEL
BUN: 5 mg/dL — ABNORMAL LOW (ref 6–23)
BUN: 6 mg/dL (ref 6–23)
BUN: 10 mg/dL (ref 6–23)
CO2: 26 mEq/L (ref 19–32)
Calcium: 9 mg/dL (ref 8.4–10.5)
Calcium: 9 mg/dL (ref 8.4–10.5)
Chloride: 90 mEq/L — ABNORMAL LOW (ref 96–112)
Creatinine, Ser: 0.43 mg/dL — ABNORMAL LOW (ref 0.50–1.10)
Creatinine, Ser: 0.4 mg/dL — ABNORMAL LOW (ref 0.50–1.10)
GFR calc Af Amer: >90 mL/min (ref >90)
GFR calc Af Amer: >90 mL/min (ref >90)
GFR calc non Af Amer: >90 mL/min (ref >90)
GFR calc non Af Amer: >90 mL/min (ref >90)
Glucose, Bld: 91 mg/dL (ref 70–99)
Glucose, Bld: 100 mg/dL — ABNORMAL HIGH (ref 70–99)

## 2011-11-15 MED ORDER — SODIUM CHLORIDE 3 % IV SOLN
INTRAVENOUS | Status: DC
  Administered 2011-11-15: 12.5 mL/h via INTRAVENOUS
  Filled 2011-11-15: qty 500

## 2011-11-15 MED ORDER — DEMECLOCYCLINE HCL 150 MG PO TABS
300.0000 mg | ORAL_TABLET | Freq: Two times a day (BID) | ORAL | Status: DC
  Administered 2011-11-15: 300 mg via ORAL
  Filled 2011-11-15 (×2): qty 2

## 2011-11-15 NOTE — Progress Notes (Signed)
Subjective: Asked to see again for Na drop to 123 today. Patient remains confused. 220 in , no UOP recorded.   Objective Vital signs in last 24 hours: Filed Vitals:   11/14/11 0700 11/14/11 1418 11/14/11 2210 11/15/11 0615  BP: 168/77 153/76 135/78 153/81  Pulse: 62 82 65 64  Temp: 97.8 F (36.6 C) 97.6 F (36.4 C) 97.9 F (36.6 C) 97.5 F (36.4 C)  TempSrc: Oral Oral Axillary Axillary  Resp: 20 18 18 18   Height:      Weight:      SpO2: 97% 99% 99% 100%   Weight change:   Intake/Output Summary (Last 24 hours) at 11/15/11 1333 Last data filed at 11/15/11 0900  Gross per 24 hour  Intake    280 ml  Output      0 ml  Net    280 ml   Labs: Basic Metabolic Panel:  Lab 11/15/11 2130 11/14/11 2305 11/14/11 1525 11/14/11 0540 11/13/11 2327 11/13/11 1540 11/13/11 0800  NA 123* 126* 126* 126* 125* 122* 121*  K 4.0 3.8 3.9 3.8 3.8 3.5 4.0  CL 90* 93* 94* 95* 95* 91* 88*  CO2 26 26 24 23 25 23 25   GLUCOSE 91 99 91 96 88 158* 91  BUN 5* 6 4* 4* 6 6 6   CREATININE 0.43* 0.41* 0.42* 0.43* 0.40* 0.41* 0.41*  ALB -- -- -- -- -- -- --  CALCIUM 9.0 8.7 8.5 8.7 8.3* 8.3* 8.7  PHOS -- -- -- -- -- -- --   Liver Function Tests: No results found for this basename: AST:3,ALT:3,ALKPHOS:3,BILITOT:3,PROT:3,ALBUMIN:3 in the last 168 hours No results found for this basename: LIPASE:3,AMYLASE:3 in the last 168 hours No results found for this basename: AMMONIA:3 in the last 168 hours CBC:  Lab 11/14/11 0540 11/13/11 1540 11/11/11 1813  WBC 2.2* 2.1* 4.6  NEUTROABS -- -- --  HGB 10.6* 10.5* 11.3*  HCT 29.9* 30.2* 31.2*  MCV 89.3 90.4 87.9  PLT 330 313 311   Physical Exam:  Blood pressure 153/81, pulse 64, temperature 97.5 F (36.4 C), temperature source Axillary, resp. rate 18, height 5\' 2"  (1.575 m), weight 51.3 kg (113 lb 1.5 oz), SpO2 100.00%.  Gen: thin, cachectic, strange affect, hollers from time to time, does not make appropriate verbal responses  Skin: no rash, cyanosis  Neck: no  JVD, no bruits or LAN  Chest: clear bilat to bases, no rales or wheezing  Heart: regular, no rub or gallop  Abdomen: soft, scaphoid, no ascites  Ext: no edema  Neuro: alert, Ox3, no focal deficit  Heme/Lymph: no bruising or LAN   Assessment/Recommendations  1. Hyponatremia most likely due to SIADH, other possibility is volume depletion.  She rec'd IV NaCl 0.9% on 5/18-5/19 with improvement in serum Na, then that was stopped and salt tabs were increased from 2 to 6 gm/day and today Na is worse.  Will repeat urine Na, K and osm.  Demeclocycline has been added.  Depending on results or urine studies will add either NS or hypertonic saline.  2. UTI 3. Severe RA 4. Chronic psychosis   Vinson Moselle  MD [facility] [phone] pgr    [phone] cell 11/15/2011, 1:33 PM

## 2011-11-15 NOTE — Progress Notes (Addendum)
Subjective: Still confused.  Patient has her eyes closed and does not open to my questions.  When I opened her eyes she is nonverbal but smiles.  Per nursing was talkative this morning.  Objective: Vital signs in last 24 hours: Filed Vitals:   11/14/11 0700 11/14/11 1418 11/14/11 2210 11/15/11 0615  BP: 168/77 153/76 135/78 153/81  Pulse: 62 82 65 64  Temp: 97.8 F (36.6 C) 97.6 F (36.4 C) 97.9 F (36.6 C) 97.5 F (36.4 C)  TempSrc: Oral Oral Axillary Axillary  Resp: 20 18 18 18   Height:      Weight:      SpO2: 97% 99% 99% 100%   Weight change:   Intake/Output Summary (Last 24 hours) at 11/15/11 1023 Last data filed at 11/15/11 0900  Gross per 24 hour  Intake    280 ml  Output      0 ml  Net    280 ml    Physical Exam: General: Awake, not oriented x3, No acute distress. HEENT: EOMI. Neck: Supple CV: S1 and S2 Lungs: Clear to ascultation bilaterally Abdomen: Soft, Nontender, Nondistended, +bowel sounds. Ext: Good pulses. Trace edema.  Lab Results:  Basename 11/15/11 0650 11/14/11 2305  NA 123* 126*  K 4.0 3.8  CL 90* 93*  CO2 26 26  GLUCOSE 91 99  BUN 5* 6  CREATININE 0.43* 0.41*  CALCIUM 9.0 8.7  MG -- --  PHOS -- --   No results found for this basename: AST:2,ALT:2,ALKPHOS:2,BILITOT:2,PROT:2,ALBUMIN:2 in the last 72 hours No results found for this basename: LIPASE:2,AMYLASE:2 in the last 72 hours  Basename 11/14/11 0540 11/13/11 1540  WBC 2.2* 2.1*  NEUTROABS -- --  HGB 10.6* 10.5*  HCT 29.9* 30.2*  MCV 89.3 90.4  PLT 330 313   No results found for this basename: CKTOTAL:3,CKMB:3,CKMBINDEX:3,TROPONINI:3 in the last 72 hours No components found with this basename: POCBNP:3 No results found for this basename: DDIMER:2 in the last 72 hours No results found for this basename: HGBA1C:2 in the last 72 hours No results found for this basename: CHOL:2,HDL:2,LDLCALC:2,TRIG:2,CHOLHDL:2,LDLDIRECT:2 in the last 72 hours No results found for this basename:  TSH,T4TOTAL,FREET3,T3FREE,THYROIDAB in the last 72 hours No results found for this basename: VITAMINB12:2,FOLATE:2,FERRITIN:2,TIBC:2,IRON:2,RETICCTPCT:2 in the last 72 hours  Micro Results: Recent Results (from the past 240 hour(s))  URINE CULTURE     Status: Normal (Preliminary result)   Collection Time   11/11/11  6:31 PM      Component Value Range Status Comment   Specimen Description URINE, RANDOM   Final    Special Requests NONE   Final    Culture  Setup Time [id number]   Final    Colony Count >=100,000 COLONIES/ML   Final    Culture GRAM NEGATIVE RODS   Final    Report Status PENDING   Incomplete     Studies/Results: No results found.  Medications: I have reviewed the patient's current medications. Scheduled Meds:    . ciprofloxacin  200 mg Intravenous Q12H  . demeclocycline  300 mg Oral BID  . docusate sodium  100 mg Oral BID  . enoxaparin  40 mg Subcutaneous QHS  . folic acid  1 mg Oral Daily  . phenytoin  200 mg Oral QHS  . senna  1 tablet Oral BID  . sodium chloride  3 mL Intravenous Q12H  . sodium chloride  2 g Oral TID WC   Continuous Infusions:   PRN Meds:.acetaminophen, acetaminophen, ondansetron (ZOFRAN) IV, ondansetron  Assessment/Plan: Altered mental  status/acute delirium/metabolic encephalopathy Etiology unclear.  Likely due to urinary tract infection and hyponatremia.  Patient had MRI of the brain on 10/25/2011 which showed no acute infarct.  Continue treatment for urinary tract infection.  Given no improvement in mentation will get an MRI of her brain.  Mentation may improve with improvement in sodium.  Recurrent hyponatremia Worse again today.  Due to SIADH.  Fluid restrict the patient to 1.2 L of normal saline.  Continue salt tablets 2 g 3 times a day. Discussed with Dr. Arlean Hopping, and started the patient on demeclocycline 300 mg po BID. Appreciate renal input.  Gram-negative rod urinary tract infection Continue ciprofloxacin.  Antibiotics since  11/11/2011.  Urine culture growing gram-negative rods, speciation and sensitivities pending.  Defined 5 to seven-day course of antibiotics.  History of seizures from hyponatremia Continue Dilantin.  History of rheumatoid arthritis Methotrexate has been discontinued.  Continue Folate.  Hypertension Stable.  Not on any antihypertensive medications.  Anemia Thought to be due to iron deficiency.  Supplemental iron held.  Hyperlipidemia Stable.  Prominent widening of the predental space with posterior superior projection of the dens /cervical medullary compression MRI findings as discussed below.  Prophylaxis Lovenox.  CODE STATUS Full code.    Disposition Pending.  [name] can be reached at [facility] [phone] or [phone] (lost his cell phone), Work [phone].   LOS: 4 days  Delecia Vastine A, MD 11/15/2011, 10:23 AM  Addendum: MRI of brain showed no acute infarct, it did show severe cervical medullary compression. Discussed the findings with Dr. Gerlene Fee, neurosurgery.  Dr. Alen Bleacher recommended that it would a large surgical undertaking for the patient and given patient's age and other co-morbidities, recommended conservative management.  Discussed risks and benefits with patient's son [name], one of the health care power of attorney, he will discuss with his brother Channing Mutters and make a decision.  If the son wants any surgical intervention at this time, please discuss with Dr. Alen Bleacher rather than any on-call neurosurgeon.  Hiram Mciver A, MD 11/15/2011, 4:00 PM

## 2011-11-15 NOTE — Progress Notes (Signed)
Physical Therapy Treatment Patient Details Name: [name] MRN: [id number] DOB: 01/25/1930 Today's Date: 11/15/2011 Time: 4098-1191 PT Time Calculation (min): 23 min  PT Assessment / Plan / Recommendation Comments on Treatment Session  Pt assisted back to bed due to RN stating she will be going for a test soon.  Unable to open both eyes throughout entire session with max enouragement provided.  Able to hold self up with 2 person HHA, however requires manual assist to weight shfit and advance steps.      Follow Up Recommendations  Skilled nursing facility    Barriers to Discharge        Equipment Recommendations  Defer to next venue    Recommendations for Other Services OT consult  Frequency Min 3X/week   Plan Discharge plan remains appropriate    Precautions / Restrictions Precautions Precautions: Fall Restrictions Weight Bearing Restrictions: No   Pertinent Vitals/Pain Unable to state pain, however would grimace with some movement.      Mobility  Bed Mobility Bed Mobility: Supine to Sit Supine to Sit: 1: +2 Total assist;HOB elevated Supine to Sit: Patient Percentage: 0% Details for Bed Mobility Assistance: Pt requires assist for B LE to EOB and off of bed and for trunk to attain sitting position.  Pt did not open eyes throughout process.   Transfers Transfers: Sit to Stand;Stand to Sit Sit to Stand: 1: +2 Total assist;From bed;With upper extremity assist Sit to Stand: Patient Percentage: 50% Stand to Sit: 1: +2 Total assist;With armrests;To chair/3-in-1 Stand to Sit: Patient Percentage: 40% Details for Transfer Assistance: Requires MAX encouragement to stand, however does well with 2 person hand held assist and with assist blocking B LE to prevent knee buckling.   Ambulation/Gait Ambulation/Gait Assistance: 1: +2 Total assist Ambulation/Gait: Patient Percentage: 10% Ambulation Distance (Feet): 5 Feet (then another 5') Assistive device: 2 person hand held  assist Ambulation/Gait Assistance Details: Provided verbal cues and manual faciliation for weight shifting and advancing opposite limb with constant cuing for technique and upright posture.   Gait Pattern: Step-to pattern;Decreased stride length;Shuffle;Decreased trunk rotation;Trunk flexed Gait velocity: slow General Gait Details: pt has difficulty initiating any movement  Stairs: No Wheelchair Mobility Wheelchair Mobility: No    Exercises     PT Diagnosis:    PT Problem List:   PT Treatment Interventions:     PT Goals Acute Rehab PT Goals PT Goal Formulation: Patient unable to participate in goal setting Time For Goal Achievement: 11/27/11 Potential to Achieve Goals: Fair Pt will go Supine/Side to Sit: with min assist PT Goal: Supine/Side to Sit - Progress: Not progressing Pt will Sit at Edge of Bed: 3-5 min;with no upper extremity support;with modified independence PT Goal: Sit at Edge Of Bed - Progress: Progressing toward goal Pt will go Sit to Supine/Side: with min assist PT Goal: Sit to Supine/Side - Progress: Not progressing Pt will go Sit to Stand: with supervision PT Goal: Sit to Stand - Progress: Progressing toward goal Pt will go Stand to Sit: with min assist PT Goal: Stand to Sit - Progress: Progressing toward goal Pt will Ambulate: 51 - 150 feet;with min assist;with least restrictive assistive device PT Goal: Ambulate - Progress: Progressing toward goal  Visit Information  Last PT Received On: 11/15/11 Assistance Needed: +2    Subjective Data  Subjective: Pt mostly nonverbal. No eye contact or opening until in standing with brief period of eye opening.   Patient Stated Goal: did not state   Cognition  Overall  Cognitive Status: History of cognitive impairments - further impaired Area of Impairment: Following commands;Attention Arousal/Alertness: Lethargic Orientation Level: Appears intact for tasks assessed Behavior During Session: Flat affect Following  Commands: Follows one step commands inconsistently Safety/Judgement: Decreased awareness of safety precautions;Decreased safety judgement for tasks assessed;Decreased awareness of need for assistance Safety/Judgement - Other Comments: Pt grabbing therapist and tech's hand with tight grip with manual cuing to release and reach for chair.   Awareness of Errors: Assistance required to identify errors made;Assistance required to correct errors made Awareness of Deficits: Pt requires increased manual and verbal cues for upright posture and stepping.  Problem Solving: could not figure out how to get herself into chair    Balance  Balance Balance Assessed: Yes Static Sitting Balance Static Sitting - Balance Support: No upper extremity supported;Feet supported Static Sitting - Level of Assistance: 1: +1 Total assist Static Sitting - Comment/# of Minutes: +1 total assist initially then min assist x approx 3-4 mins. Constant cuing to keep motivated.   End of Session PT - End of Session Activity Tolerance: Patient limited by fatigue Patient left: in bed;with call bell/phone within reach Nurse Communication: Mobility status    Page, Meribeth Mattes 11/15/2011, 12:20 PM

## 2011-11-15 NOTE — Progress Notes (Signed)
Urine studies not sent, serum Na down to 122.  Will d/c other therapies and start hypertonic saline 3%, 300 mL over 24 hrs. Check Bmet every 3 hrs to avoid overcorrection.   Vinson Moselle  MD [phone] pgr    [phone] cell 11/15/2011, 8:47 PM

## 2011-11-16 ENCOUNTER — Inpatient Hospital Stay (HOSPITAL_COMMUNITY): Payer: Medicare Other

## 2011-11-16 LAB — URINE CULTURE
Colony Count: >=100000
Culture  Setup Time: 201305180144

## 2011-11-16 LAB — BASIC METABOLIC PANEL
BUN: 8 mg/dL (ref 6–23)
BUN: 7 mg/dL (ref 6–23)
BUN: 7 mg/dL (ref 6–23)
BUN: 7 mg/dL (ref 6–23)
CO2: 25 mEq/L (ref 19–32)
CO2: 24 mEq/L (ref 19–32)
Calcium: 8.9 mg/dL (ref 8.4–10.5)
Calcium: 8.8 mg/dL (ref 8.4–10.5)
Calcium: 8.8 mg/dL (ref 8.4–10.5)
Calcium: 8.7 mg/dL (ref 8.4–10.5)
Chloride: 88 mEq/L — ABNORMAL LOW (ref 96–112)
Creatinine, Ser: 0.42 mg/dL — ABNORMAL LOW (ref 0.50–1.10)
Creatinine, Ser: 0.47 mg/dL — ABNORMAL LOW (ref 0.50–1.10)
Creatinine, Ser: 0.37 mg/dL — ABNORMAL LOW (ref 0.50–1.10)
Creatinine, Ser: 0.37 mg/dL — ABNORMAL LOW (ref 0.50–1.10)
GFR calc Af Amer: >90 mL/min (ref >90)
GFR calc non Af Amer: >90 mL/min (ref >90)
GFR calc non Af Amer: >90 mL/min (ref >90)
Glucose, Bld: 94 mg/dL (ref 70–99)
Glucose, Bld: 98 mg/dL (ref 70–99)
Glucose, Bld: 115 mg/dL — ABNORMAL HIGH (ref 70–99)
Potassium: 3.9 mEq/L (ref 3.5–5.1)
Sodium: 117 mEq/L — CL (ref 135–145)

## 2011-11-16 LAB — PHENYTOIN LEVEL, FREE AND TOTAL
Phenytoin, Free: 1.4 mg/L (ref 1.0–2.0)
Phenytoin, Total: 18 mg/L (ref 10.0–20.0)

## 2011-11-16 MED ORDER — SODIUM CHLORIDE 3 % IV SOLN
INTRAVENOUS | Status: AC
  Filled 2011-11-16: qty 500

## 2011-11-16 MED ORDER — SODIUM CHLORIDE 0.9 % IJ SOLN
10.0000 mL | INTRAMUSCULAR | Status: DC | PRN
  Administered 2011-11-16 – 2011-11-17 (×2): 10 mL
  Administered 2011-11-18: 20 mL
  Administered 2011-11-19 – 2011-11-20 (×3): 10 mL
  Administered 2011-11-21: 20 mL
  Administered 2011-11-21 – 2011-11-22 (×3): 10 mL

## 2011-11-16 MED ORDER — CIPROFLOXACIN HCL 250 MG PO TABS
250.0000 mg | ORAL_TABLET | Freq: Two times a day (BID) | ORAL | Status: DC
  Administered 2011-11-16 – 2011-11-18 (×4): 250 mg via ORAL
  Filled 2011-11-16 (×6): qty 1

## 2011-11-16 MED ORDER — HYDRALAZINE HCL 20 MG/ML IJ SOLN: 10.0000 mg | INTRAMUSCULAR | Status: DC | PRN

## 2011-11-16 NOTE — Progress Notes (Signed)
PHARMACIST - PHYSICIAN COMMUNICATION DR:   Betti Cruz CONCERNING: Antibiotic IV to Oral Route Change Policy  RECOMMENDATION: This patient is receiving Cipro by the intravenous route.  Based on criteria approved by the Pharmacy and Therapeutics Committee, the antibiotic(s) is/are being converted to the equivalent oral dose form(s).   DESCRIPTION: These criteria include:  Patient being treated for a respiratory tract infection, urinary tract infection, or cellulitis  The patient is not neutropenic and does not exhibit a GI malabsorption state  The patient is eating (either orally or via tube) and/or has been taking other orally administered medications for a least 24 hours  The patient is improving clinically and has a Tmax < 100.5  If you have questions about this conversion, please contact the Pharmacy Department  []   [phone] )  [facility] []   [phone] )  [facility]  [x]   [phone] )  [address] [facility] []   [phone] )  [facility]   Loralee Pacas, PharmD, BCPS 11/16/2011 8:40 AM

## 2011-11-16 NOTE — Progress Notes (Signed)
Per PICC nurse, POA wants to talk with patient's other son (ROY) before agreeing to PICC line.  Spoke with Channing Mutters he is said it is ok with him.  Trying to reach POA [name]) back, not getting an answer.

## 2011-11-16 NOTE — Progress Notes (Signed)
Paged MD regarding critical lab value.  Na+ = 117

## 2011-11-16 NOTE — Progress Notes (Signed)
Subjective: Patient able to tell me she is in the hospital and the year.   Objective: Vital signs in last 24 hours: Filed Vitals:   11/15/11 0615 11/15/11 1435 11/15/11 2053 11/16/11 0513  BP: 153/81 134/74 123/73 153/86  Pulse: 64 64 73 70  Temp: 97.5 F (36.4 C) 97.9 F (36.6 C) 98 F (36.7 C) 98.2 F (36.8 C)  TempSrc: Axillary Oral Oral Oral  Resp: 18 18 18 16   Height:      Weight:      SpO2: 100% 100% 97% 98%   Weight change:   Intake/Output Summary (Last 24 hours) at 11/16/11 1021 Last data filed at 11/16/11 0901  Gross per 24 hour  Intake 650.42 ml  Output      0 ml  Net 650.42 ml    Physical Exam: General: Awake and alert, No acute distress. Neck: Supple CV: S1 and S2 Lungs: Clear to ascultation bilaterally Abdomen: Soft, Nontender, Nondistended, +bowel sounds. Ext: Good pulses. Trace edema.  Lab Results:  Basename 11/16/11 0840 11/16/11 0440  NA 121* 123*  K 4.1 3.9  CL 88* 91*  CO2 26 26  GLUCOSE 104* 98  BUN 7 8  CREATININE 0.47* 0.41*  CALCIUM 9.0 8.8  MG -- --  PHOS -- --    Basename 11/14/11 0540 11/13/11 1540  WBC 2.2* 2.1*  NEUTROABS -- --  HGB 10.6* 10.5*  HCT 29.9* 30.2*  MCV 89.3 90.4  PLT 330 313   Micro Results: Recent Results (from the past 240 hour(s))  URINE CULTURE     Status: Normal (Preliminary result)   Collection Time   11/11/11  6:31 PM      Component Value Range Status Comment   Specimen Description URINE, RANDOM   Final    Special Requests NONE   Final    Culture  Setup Time [id number]   Final    Colony Count >=100,000 COLONIES/ML   Final    Culture GRAM NEGATIVE RODS   Final    Report Status PENDING   Incomplete     Studies/Results: Mr Brain Wo Contrast  11/15/2011  *RADIOLOGY REPORT*  Clinical Data: Worsening confusion and lethargy.  Hyponatremia. Rheumatoid arthritis.  MRI HEAD WITHOUT CONTRAST  Technique:  Multiplanar, multiecho pulse sequences of the brain and surrounding structures were obtained  according to standard protocol without intravenous contrast.  Comparison: 10/25/2011.  Findings: Motion degraded exam.  Fast sequencing had to be utilized.  Prominent widening of the predental space with posterior superior projection of the dens which is causing severe compression of the cervical medullary junction.  Within the compressed cervical medullary junction, slight increased signal suggesting gliosis and / or edema.  Upper cervical cord below this compression is atrophic.  No acute infarct.  Global atrophy, ventricular prominence probably related to atrophy rather hydrocephalus.  Mild small vessel disease type changes.  No intracranial hemorrhage.  No intracranial mass lesion detected on this unenhanced exam.  Major intracranial vascular structures are patent.  IMPRESSION: Severe cervical medullary compression as detailed above.  No acute infarct.  Global atrophy.  Mild small vessel disease type changes.  Critical Value/emergent results were called by telephone at the time of interpretation on 11/15/2011 and  at 2:45 p.m.  to  Dr. Betti Cruz, who verbally acknowledged these results.  Original Report Authenticated By: Fuller Canada, M.D.    Medications: I have reviewed the patient's current medications. Scheduled Meds:    . ciprofloxacin  200 mg Intravenous Q12H  . ciprofloxacin  250 mg Oral BID  . docusate sodium  100 mg Oral BID  . enoxaparin  40 mg Subcutaneous QHS  . folic acid  1 mg Oral Daily  . phenytoin  200 mg Oral QHS  . senna  1 tablet Oral BID  . sodium chloride  3 mL Intravenous Q12H  . DISCONTD: demeclocycline  300 mg Oral BID  . DISCONTD: sodium chloride  2 g Oral TID WC   Continuous Infusions:    . sodium chloride (hypertonic) 12.5 mL/hr (11/15/11 2113)   PRN Meds:.acetaminophen, acetaminophen, hydrALAZINE, ondansetron (ZOFRAN) IV, ondansetron  Assessment/Plan: Altered mental status/acute delirium/metabolic encephalopathy Probably due to urinary tract infection and  hyponatremia.  Patient had MRI of the brain on 10/25/2011 which showed no acute infarct.  Continue treatment for urinary tract infection.  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 Nephrology is following. The patient is presently getting hypertonic saline. Will continue to monitor.  Gram-negative rod urinary tract infection Continue ciprofloxacin.  Antibiotics since 11/11/2011.  Urine culture growing gram-negative rods, sensitive to Cipro.  Defined 5 to seven-day course of antibiotics antibiotic day 6/ 7.  History of seizures from hyponatremia Continue Dilantin.  History of rheumatoid arthritis Methotrexate has been discontinued.  Continue Folate.  Hypertension Stable.  Not on any antihypertensive medications.  Anemia Thought to be due to iron deficiency.  Supplemental iron held.  Hyperlipidemia Stable.  Prominent widening of the predental space with posterior superior projection of the dens /cervical medullary compression Per Dr. Reddy's note this was discussed with neurosurgery and family. Family is discussing it among themselves and will decide if they would like more aggressive workup by neurosurgery. Patient most likely would not be a good surgical candidate.   Prophylaxis Lovenox.  CODE STATUS Full code.    Disposition Pending.  [name] can be reached at [facility] [phone] or [phone] (lost his cell phone), Work [phone]. Tried calling son today [name], Left message on home number. Tried both home number and cell number  LOS: 5 days  Tawan Degroote, Ladell Pier, MD 11/16/2011, 10:21 AM Pager [phone]

## 2011-11-16 NOTE — Progress Notes (Signed)
Paged MD. Patients family wants to talk with MD before agreeing to PICC line.  Awaiting call back.

## 2011-11-16 NOTE — Progress Notes (Addendum)
Subjective: Na was improving on hypertonic saline, but then pt pulled her IV out and is going down again.   Objective Vital signs in last 24 hours: Filed Vitals:   11/15/11 1435 11/15/11 2053 11/16/11 0513 11/16/11 1325  BP: 134/74 123/73 153/86 156/82  Pulse: 64 73 70 69  Temp: 97.9 F (36.6 C) 98 F (36.7 C) 98.2 F (36.8 C) 98.4 F (36.9 C)  TempSrc: Oral Oral Oral Oral  Resp: 18 18 16 18   Height:      Weight:      SpO2: 100% 97% 98% 100%   Weight change:   Intake/Output Summary (Last 24 hours) at 11/16/11 1638 Last data filed at 11/16/11 1435  Gross per 24 hour  Intake 680.42 ml  Output      0 ml  Net 680.42 ml   Labs: Basic Metabolic Panel:  Lab 11/16/11 5621 11/16/11 0840 11/16/11 0440 11/16/11 0122 11/15/11 2150 11/15/11 1525 11/15/11 0650  NA 120* 121* 123* 120* 122* 122* 123*  K 4.0 4.1 3.9 4.1 4.3 4.1 4.0  CL 87* 88* 91* 88* 90* 90* 90*  CO2 24 26 26 25 26 25 26   GLUCOSE 101* 104* 98 94 100* 83 91  BUN 7 7 8 8 10 6  5*  CREATININE 0.37* 0.47* 0.41* 0.42* 0.40* 0.43* 0.43*  ALB -- -- -- -- -- -- --  CALCIUM 8.8 9.0 8.8 8.9 8.6 9.0 9.0  PHOS -- -- -- -- -- -- --   CBC:  Lab 11/14/11 0540 11/13/11 1540 11/11/11 1813  WBC 2.2* 2.1* 4.6  NEUTROABS -- -- --  HGB 10.6* 10.5* 11.3*  HCT 29.9* 30.2* 31.2*  MCV 89.3 90.4 87.9  PLT 330 313 311   Physical Exam:  Blood pressure 156/82, pulse 69, temperature 98.4 F (36.9 C), temperature source Oral, resp. rate 18, height 5\' 2"  (1.575 m), weight 51.3 kg (113 lb 1.5 oz), SpO2 100.00%.  Gen: thin, cachectic, strange affect, hollers from time to time, does not make appropriate verbal responses  Skin: no rash, cyanosis  Neck: no JVD, no bruits or LAN  Chest: clear bilat to bases, no rales or wheezing  Heart: regular, no rub or gallop  Abdomen: soft, scaphoid, no ascites  Ext: no edema  Neuro: alert, Ox3, no focal deficit  Heme/Lymph: no bruising or LAN   Assessment/Recommendations  1. Hyponatremia most likely  due to SIADH- repeat urine osm is high around 650, urine Na is high. Started hypertonic saline last night, but she pulled IV out. Awaiting PICC line. Will resume once IV access obtained.  Would consider readdressing code status- pt has advanced comorbidities, is refusing treatments and removed her IV, and is at high risk of serious complications from her hyponatremia.  Would consider DNR.  2. UTI 3. Severe RA 4. Chronic psychosis   Vinson Moselle  MD [facility] [phone] pgr    [phone] cell 11/16/2011, 4:38 PM

## 2011-11-16 NOTE — Progress Notes (Signed)
CRITICAL VALUE ALERT  Critical value received:  Na+  Date of notification:  [id number]  Time of notification:  1815  Critical value read back:yes  Nurse who received alert:  Dub Mikes  MD notified (1st page):  [phone]  Time of first page:  1835  MD notified (2nd page):  Time of second page:  Responding MD:  pending  Time MD responded:  pending

## 2011-11-16 NOTE — Progress Notes (Signed)
Patient pulled out IV this am.  IV team attempted restart unsuccessfully.  MD paged, order PICC line.  Per IV team, PICC nurse has left hospital for [facility].  Will return to [employer] later.  Patients IV antibiotic and hypertonic solution not given at this time.

## 2011-11-17 DIAGNOSIS — E236 Other disorders of pituitary gland: Secondary | ICD-10-CM

## 2011-11-17 DIAGNOSIS — E782 Mixed hyperlipidemia: Secondary | ICD-10-CM

## 2011-11-17 DIAGNOSIS — R197 Diarrhea, unspecified: Secondary | ICD-10-CM

## 2011-11-17 DIAGNOSIS — M069 Rheumatoid arthritis, unspecified: Secondary | ICD-10-CM

## 2011-11-17 LAB — BASIC METABOLIC PANEL
BUN: 11 mg/dL (ref 6–23)
BUN: 14 mg/dL (ref 6–23)
BUN: 16 mg/dL (ref 6–23)
BUN: 8 mg/dL (ref 6–23)
CO2: 25 mEq/L (ref 19–32)
CO2: 26 mEq/L (ref 19–32)
Calcium: 8.4 mg/dL (ref 8.4–10.5)
Calcium: 8.5 mg/dL (ref 8.4–10.5)
Calcium: 8.6 mg/dL (ref 8.4–10.5)
Calcium: 8.7 mg/dL (ref 8.4–10.5)
Calcium: 8.8 mg/dL (ref 8.4–10.5)
Calcium: 9 mg/dL (ref 8.4–10.5)
Calcium: 9 mg/dL (ref 8.4–10.5)
Creatinine, Ser: 0.42 mg/dL — ABNORMAL LOW (ref 0.50–1.10)
Creatinine, Ser: 0.45 mg/dL — ABNORMAL LOW (ref 0.50–1.10)
Creatinine, Ser: 0.47 mg/dL — ABNORMAL LOW (ref 0.50–1.10)
Creatinine, Ser: 0.47 mg/dL — ABNORMAL LOW (ref 0.50–1.10)
Creatinine, Ser: 0.48 mg/dL — ABNORMAL LOW (ref 0.50–1.10)
Creatinine, Ser: 0.5 mg/dL (ref 0.50–1.10)
GFR calc Af Amer: >90 mL/min (ref >90)
GFR calc Af Amer: >90 mL/min (ref >90)
GFR calc Af Amer: >90 mL/min (ref >90)
GFR calc Af Amer: >90 mL/min (ref >90)
GFR calc non Af Amer: 90 mL/min — ABNORMAL LOW (ref >90)
GFR calc non Af Amer: >90 mL/min (ref >90)
GFR calc non Af Amer: >90 mL/min (ref >90)
GFR calc non Af Amer: >90 mL/min (ref >90)
GFR calc non Af Amer: >90 mL/min (ref >90)
GFR calc non Af Amer: >90 mL/min (ref >90)
Glucose, Bld: 172 mg/dL — ABNORMAL HIGH (ref 70–99)
Glucose, Bld: 86 mg/dL (ref 70–99)
Potassium: 3.5 mEq/L (ref 3.5–5.1)
Potassium: 3.8 mEq/L (ref 3.5–5.1)
Potassium: 4.1 mEq/L (ref 3.5–5.1)
Sodium: 121 mEq/L — ABNORMAL LOW (ref 135–145)
Sodium: 124 mEq/L — ABNORMAL LOW (ref 135–145)
Sodium: 125 mEq/L — ABNORMAL LOW (ref 135–145)

## 2011-11-17 MED ORDER — SODIUM CHLORIDE 3 % IV SOLN
INTRAVENOUS | Status: AC
  Administered 2011-11-17: 14:00:00 via INTRAVENOUS
  Filled 2011-11-17: qty 500

## 2011-11-17 MED ORDER — SODIUM CHLORIDE 3 % IV SOLN: INTRAVENOUS | Status: DC

## 2011-11-17 NOTE — Progress Notes (Signed)
Subjective: Na up to 124 at 1130 this am.  Rec'd hypertonic saline an unknown number of hours overnight after PICC placed. Pt is much more conversant this morning.   Objective Vital signs in last 24 hours: Filed Vitals:   11/16/11 0513 11/16/11 1325 11/16/11 2135 11/17/11 0533  BP: 153/86 156/82 135/77 110/63  Pulse: 70 69 75 86  Temp: 98.2 F (36.8 C) 98.4 F (36.9 C) 98.3 F (36.8 C) 98.5 F (36.9 C)  TempSrc: Oral Oral Oral Oral  Resp: 16 18 16 16   Height:      Weight:      SpO2: 98% 100% 99% 97%   Weight change:   Intake/Output Summary (Last 24 hours) at 11/17/11 1258 Last data filed at 11/17/11 0900  Gross per 24 hour  Intake    470 ml  Output    650 ml  Net   -180 ml   Labs: Basic Metabolic Panel:  Lab 11/17/11 1610 11/17/11 0855 11/17/11 0400 11/16/11 2340 11/16/11 1658 11/16/11 1305 11/16/11 0840  NA 124* 121* 123* 121* 117* 120* 121*  K 3.5 3.6 3.7 3.8 3.8 4.0 4.1  CL 90* 86* 88* 86* 86* 87* 88*  CO2 26 26 25 27 22 24 26   GLUCOSE 164* 92 97 103* 115* 101* 104*  BUN 11 9 9 8 7 7 7   CREATININE 0.48* 0.47* 0.45* 0.42* 0.37* 0.37* 0.47*  ALB -- -- -- -- -- -- --  CALCIUM 8.7 9.0 9.0 8.8 8.7 8.8 9.0  PHOS -- -- -- -- -- -- --   CBC:  Lab 11/14/11 0540 11/13/11 1540 11/11/11 1813  WBC 2.2* 2.1* 4.6  NEUTROABS -- -- --  HGB 10.6* 10.5* 11.3*  HCT 29.9* 30.2* 31.2*  MCV 89.3 90.4 87.9  PLT 330 313 311   Physical Exam:  Blood pressure 110/63, pulse 86, temperature 98.5 F (36.9 C), temperature source Oral, resp. rate 16, height 5\' 2"  (1.575 m), weight 51.3 kg (113 lb 1.5 oz), SpO2 97.00%.  Gen: thin, cachectic, strange affect, hollers from time to time, does not make appropriate verbal responses  Skin: no rash, cyanosis  Neck: no JVD, no bruits or LAN  Chest: clear bilat to bases, no rales or wheezing  Heart: regular, no rub or gallop  Abdomen: soft, scaphoid, no ascites  Ext: no edema  Neuro: alert, Ox3, no focal deficit  Heme/Lymph: no bruising or LAN    Assessment/Recommendations  1. Hyponatremia due to SIADH- repeat urine osm is high around 650, urine Na is high. Received hypertonic saline overnight. Will continue, increase to 15 ml/hr. Mental status is much better.  I asked her about DNR and she says she wants to "go in peace" and has a living will at home.  Primary MD please check into this.    2. UTI 3. Severe RA 4. Chronic psychosis   Vinson Moselle  MD [facility] [phone] pgr    [phone] cell 11/17/2011, 12:58 PM

## 2011-11-17 NOTE — Progress Notes (Signed)
Unable to ambulate pt.  Pt needs 2 person assist from bed to chair.

## 2011-11-17 NOTE — Progress Notes (Signed)
Subjective: Patient  Is alert and oriented X3.  She feels OK. States that she wants to go home .   Objective: Vital signs in last 24 hours: Filed Vitals:   11/15/11 0615 11/15/11 1435 11/15/11 2053 11/16/11 0513  BP: 153/81 134/74 123/73 153/86  Pulse: 64 64 73 70  Temp: 97.5 F (36.4 C) 97.9 F (36.6 C) 98 F (36.7 C) 98.2 F (36.8 C)  TempSrc: Axillary Oral Oral Oral  Resp: 18 18 18 16   Height:      Weight:      SpO2: 100% 100% 97% 98%   Weight change:   Intake/Output Summary (Last 24 hours) at 11/16/11 1021 Last data filed at 11/16/11 0901  Gross per 24 hour  Intake 650.42 ml  Output      0 ml  Net 650.42 ml    Physical Exam: General: Awake and alert, No acute distress. Neck: Supple CV: S1 and S2 Lungs: Clear to ascultation bilaterally Abdomen: Soft, Nontender, Nondistended, +bowel sounds. Ext: Good pulses. Trace edema.  Lab Results:  Basename 11/16/11 0840 11/16/11 0440  NA 121* 123*  K 4.1 3.9  CL 88* 91*  CO2 26 26  GLUCOSE 104* 98  BUN 7 8  CREATININE 0.47* 0.41*  CALCIUM 9.0 8.8  MG -- --  PHOS -- --    Basename 11/14/11 0540 11/13/11 1540  WBC 2.2* 2.1*  NEUTROABS -- --  HGB 10.6* 10.5*  HCT 29.9* 30.2*  MCV 89.3 90.4  PLT 330 313   Micro Results: Recent Results (from the past 240 hour(s))  URINE CULTURE     Status: Normal (Preliminary result)   Collection Time   11/11/11  6:31 PM      Component Value Range Status Comment   Specimen Description URINE, RANDOM   Final    Special Requests NONE   Final    Culture  Setup Time [id number]   Final    Colony Count >=100,000 COLONIES/ML   Final    Culture GRAM NEGATIVE RODS   Final    Report Status PENDING   Incomplete     Studies/Results: Mr Brain Wo Contrast  11/15/2011  *RADIOLOGY REPORT*  Clinical Data: Worsening confusion and lethargy.  Hyponatremia. Rheumatoid arthritis.  MRI HEAD WITHOUT CONTRAST  Technique:  Multiplanar, multiecho pulse sequences of the brain and surrounding  structures were obtained according to standard protocol without intravenous contrast.  Comparison: 10/25/2011.  Findings: Motion degraded exam.  Fast sequencing had to be utilized.  Prominent widening of the predental space with posterior superior projection of the dens which is causing severe compression of the cervical medullary junction.  Within the compressed cervical medullary junction, slight increased signal suggesting gliosis and / or edema.  Upper cervical cord below this compression is atrophic.  No acute infarct.  Global atrophy, ventricular prominence probably related to atrophy rather hydrocephalus.  Mild small vessel disease type changes.  No intracranial hemorrhage.  No intracranial mass lesion detected on this unenhanced exam.  Major intracranial vascular structures are patent.  IMPRESSION: Severe cervical medullary compression as detailed above.  No acute infarct.  Global atrophy.  Mild small vessel disease type changes.  Critical Value/emergent results were called by telephone at the time of interpretation on 11/15/2011 and  at 2:45 p.m.  to  Dr. Betti Cruz, who verbally acknowledged these results.  Original Report Authenticated By: Fuller Canada, M.D.    Medications: I have reviewed the patient's current medications. Scheduled Meds:    . ciprofloxacin  200  mg Intravenous Q12H  . ciprofloxacin  250 mg Oral BID  . docusate sodium  100 mg Oral BID  . enoxaparin  40 mg Subcutaneous QHS  . folic acid  1 mg Oral Daily  . phenytoin  200 mg Oral QHS  . senna  1 tablet Oral BID  . sodium chloride  3 mL Intravenous Q12H  . DISCONTD: demeclocycline  300 mg Oral BID  . DISCONTD: sodium chloride  2 g Oral TID WC   Continuous Infusions:    . sodium chloride (hypertonic) 12.5 mL/hr (11/15/11 2113)   PRN Meds:.acetaminophen, acetaminophen, hydrALAZINE, ondansetron (ZOFRAN) IV, ondansetron  Assessment/Plan: Altered mental status/acute delirium/metabolic encephalopathy Probably due to  urinary tract infection and hyponatremia.  Patient had MRI of the brain on 10/25/2011 which showed no acute infarct.  Continue treatment for urinary tract infection.  Repeat MRI yesterday did not show any acute infarct. Altered mental status has resolved.  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 Nephrology is following. The patient is presently getting hypertonic saline. Will continue to monitor.  Will discuss CODE STATUS with family as well as patient.  Gram-negative rod urinary tract infection Continue ciprofloxacin.  Antibiotics since 11/11/2011.  Urine culture growing gram-negative rods, sensitive to Cipro.  Defined 5 to seven-day course of antibiotics antibiotic day 7/ 7.  History of seizures from hyponatremia Continue Dilantin.  History of rheumatoid arthritis Methotrexate has been discontinued.  Continue Folate.  Hypertension Stable.  Not on any antihypertensive medications.  Anemia Thought to be due to iron deficiency.  Supplemental iron held.  Hyperlipidemia Stable.  Prominent widening of the predental space with posterior superior projection of the dens /cervical medullary compression Per Dr. Reddy's note this was discussed with neurosurgery and family. Family is discussing it among themselves and will decide if they would like more aggressive workup by neurosurgery. Patient most likely would not be a good surgical candidate.  Discussed with his son today. They still have not decided.   Prophylaxis Lovenox.  CODE STATUS Full code.    Disposition Pending.  [name] can be reached at [facility] [phone] or [phone] (lost his cell phone), Work [phone]. Discussed with patient's son [name].  Carollee Massed, MD 11/17/2011, 10:21 AM Pager [phone]

## 2011-11-17 NOTE — Progress Notes (Signed)
PT Cancellation Note  Treatment cancelled today due to medical issues with patient which prohibited therapy.  Pt with severe cervical medullary compression.  Per Dr. Jae Dire note 5/21: "Discussed the findings with Dr. Gerlene Fee, neurosurgery. Dr. Alen Bleacher recommended that it would a large surgical undertaking for the patient and given patient's age and other co-morbidities, recommended conservative management."  Also stated family still deciding surgery vs. Conservative.  Will await family input as well as precautions/guidelines for mobility from MD prior to therapy treatment.   [name],KATHrine E 11/17/2011, 12:49 PM Pager: [phone]

## 2011-11-18 DIAGNOSIS — E782 Mixed hyperlipidemia: Secondary | ICD-10-CM

## 2011-11-18 DIAGNOSIS — M069 Rheumatoid arthritis, unspecified: Secondary | ICD-10-CM

## 2011-11-18 DIAGNOSIS — E236 Other disorders of pituitary gland: Secondary | ICD-10-CM

## 2011-11-18 DIAGNOSIS — R197 Diarrhea, unspecified: Secondary | ICD-10-CM

## 2011-11-18 LAB — BASIC METABOLIC PANEL
BUN: 13 mg/dL (ref 6–23)
BUN: 12 mg/dL (ref 6–23)
CO2: 27 mEq/L (ref 19–32)
Calcium: 8.4 mg/dL (ref 8.4–10.5)
Calcium: 8.2 mg/dL — ABNORMAL LOW (ref 8.4–10.5)
Calcium: 9.3 mg/dL (ref 8.4–10.5)
Chloride: 93 mEq/L — ABNORMAL LOW (ref 96–112)
Chloride: 94 mEq/L — ABNORMAL LOW (ref 96–112)
Creatinine, Ser: 0.48 mg/dL — ABNORMAL LOW (ref 0.50–1.10)
Creatinine, Ser: 0.46 mg/dL — ABNORMAL LOW (ref 0.50–1.10)
Creatinine, Ser: 0.49 mg/dL — ABNORMAL LOW (ref 0.50–1.10)
GFR calc Af Amer: >90 mL/min (ref >90)
GFR calc Af Amer: >90 mL/min (ref >90)
GFR calc Af Amer: >90 mL/min (ref >90)
GFR calc Af Amer: >90 mL/min (ref >90)
GFR calc non Af Amer: >90 mL/min (ref >90)
GFR calc non Af Amer: >90 mL/min (ref >90)
GFR calc non Af Amer: >90 mL/min (ref >90)
GFR calc non Af Amer: >90 mL/min (ref >90)
Glucose, Bld: 100 mg/dL — ABNORMAL HIGH (ref 70–99)
Potassium: 3.9 mEq/L (ref 3.5–5.1)
Potassium: 3.7 mEq/L (ref 3.5–5.1)
Sodium: 133 mEq/L — ABNORMAL LOW (ref 135–145)
Sodium: 128 mEq/L — ABNORMAL LOW (ref 135–145)

## 2011-11-18 MED ORDER — SODIUM CHLORIDE 1 G PO TABS
3.0000 g | ORAL_TABLET | Freq: Three times a day (TID) | ORAL | Status: DC
  Administered 2011-11-18 – 2011-11-25 (×17): 3 g via ORAL
  Filled 2011-11-18 (×23): qty 3

## 2011-11-18 NOTE — Progress Notes (Signed)
CSW continues to follow for [facility] to [facility] when medically cleared.  Jacklyn Branan C. Kenzie Thoreson MSW, LCSW [phone]

## 2011-11-18 NOTE — Progress Notes (Signed)
Subjective: Patient  Is alert and oriented X3.  She feels OK.    Objective: Vital signs in last 24 hours: Filed Vitals:   11/15/11 0615 11/15/11 1435 11/15/11 2053 11/16/11 0513  BP: 153/81 134/74 123/73 153/86  Pulse: 64 64 73 70  Temp: 97.5 F (36.4 C) 97.9 F (36.6 C) 98 F (36.7 C) 98.2 F (36.8 C)  TempSrc: Axillary Oral Oral Oral  Resp: 18 18 18 16   Height:      Weight:      SpO2: 100% 100% 97% 98%   Weight change:   Intake/Output Summary (Last 24 hours) at 11/16/11 1021 Last data filed at 11/16/11 0901  Gross per 24 hour  Intake 650.42 ml  Output      0 ml  Net 650.42 ml    Physical Exam: General: Awake and alert, No acute distress. Neck: Supple CV: S1 and S2 Lungs: Clear to ascultation bilaterally Abdomen: Soft, Nontender, Nondistended, +bowel sounds. Ext: Good pulses. Trace edema.  Lab Results:  Basename 11/16/11 0840 11/16/11 0440  NA 121* 123*  K 4.1 3.9  CL 88* 91*  CO2 26 26  GLUCOSE 104* 98  BUN 7 8  CREATININE 0.47* 0.41*  CALCIUM 9.0 8.8  MG -- --  PHOS -- --    Basename 11/14/11 0540 11/13/11 1540  WBC 2.2* 2.1*  NEUTROABS -- --  HGB 10.6* 10.5*  HCT 29.9* 30.2*  MCV 89.3 90.4  PLT 330 313   Micro Results: Recent Results (from the past 240 hour(s))  URINE CULTURE     Status: Normal (Preliminary result)   Collection Time   11/11/11  6:31 PM      Component Value Range Status Comment   Specimen Description URINE, RANDOM   Final    Special Requests NONE   Final    Culture  Setup Time [id number]   Final    Colony Count >=100,000 COLONIES/ML   Final    Culture GRAM NEGATIVE RODS   Final    Report Status PENDING   Incomplete     Studies/Results: Mr Brain Wo Contrast  11/15/2011  *RADIOLOGY REPORT*  Clinical Data: Worsening confusion and lethargy.  Hyponatremia. Rheumatoid arthritis.  MRI HEAD WITHOUT CONTRAST  Technique:  Multiplanar, multiecho pulse sequences of the brain and surrounding structures were obtained according to  standard protocol without intravenous contrast.  Comparison: 10/25/2011.  Findings: Motion degraded exam.  Fast sequencing had to be utilized.  Prominent widening of the predental space with posterior superior projection of the dens which is causing severe compression of the cervical medullary junction.  Within the compressed cervical medullary junction, slight increased signal suggesting gliosis and / or edema.  Upper cervical cord below this compression is atrophic.  No acute infarct.  Global atrophy, ventricular prominence probably related to atrophy rather hydrocephalus.  Mild small vessel disease type changes.  No intracranial hemorrhage.  No intracranial mass lesion detected on this unenhanced exam.  Major intracranial vascular structures are patent.  IMPRESSION: Severe cervical medullary compression as detailed above.  No acute infarct.  Global atrophy.  Mild small vessel disease type changes.  Critical Value/emergent results were called by telephone at the time of interpretation on 11/15/2011 and  at 2:45 p.m.  to  Dr. Betti Cruz, who verbally acknowledged these results.  Original Report Authenticated By: Fuller Canada, M.D.    Medications: I have reviewed the patient's current medications. Scheduled Meds:    . ciprofloxacin  200 mg Intravenous Q12H  . ciprofloxacin  250 mg Oral BID  . docusate sodium  100 mg Oral BID  . enoxaparin  40 mg Subcutaneous QHS  . folic acid  1 mg Oral Daily  . phenytoin  200 mg Oral QHS  . senna  1 tablet Oral BID  . sodium chloride  3 mL Intravenous Q12H  . DISCONTD: demeclocycline  300 mg Oral BID  . DISCONTD: sodium chloride  2 g Oral TID WC   Continuous Infusions:    . sodium chloride (hypertonic) 12.5 mL/hr (11/15/11 2113)   PRN Meds:.acetaminophen, acetaminophen, hydrALAZINE, ondansetron (ZOFRAN) IV, ondansetron  Assessment/Plan: Altered mental status/acute delirium/metabolic encephalopathy Probably due to urinary tract infection and hyponatremia.   Patient had MRI of the brain on 10/25/2011 which showed no acute infarct.  Continue treatment for urinary tract infection.  Repeat MRI yesterday did not show any acute infarct. Altered mental status wax and wane . Tried to call son today no answer. Will try again tomorrow.  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 Nephrology is following. Sodium improved with hypertonic saline. Patient is known salt tablets.  Was not able to get in touch with family today. Will try tomorrow  Gram-negative rod urinary tract infection Continue ciprofloxacin.  Antibiotics since 11/11/2011.  Urine culture growing gram-negative rods, sensitive to Cipro.  Patient treated with Cipro for 7 days. DC Cipro and recheck UA.    History of seizures from hyponatremia Continue Dilantin.  History of rheumatoid arthritis Methotrexate has been discontinued.  Continue Folate.  Hypertension Stable.  Not on any antihypertensive medications.  Anemia Thought to be due to iron deficiency.  Supplemental iron held.  Hyperlipidemia Stable.  Prominent widening of the predental space with posterior superior projection of the dens /cervical medullary compression Per Dr. Reddy's note this was discussed with neurosurgery and family. Family is discussing it among themselves and will decide if they would like more aggressive workup by neurosurgery. Patient most likely would not be a good surgical candidate.  Discussed with his son today. They still have not decided.  Will try calling him again tomorrow 5/25.   Prophylaxis Lovenox.  CODE STATUS Full code.  Need to readdress CODE STATUS with son.  Disposition Pending.  [name] can be reached at [facility] [phone] or [phone] (lost his cell phone), Work [phone]. Discussed with patient's son [name].  Carollee Massed, MD 11/18/2011, 10:21 AM Pager [phone]

## 2011-11-18 NOTE — Progress Notes (Signed)
VASCULAR LAB PRELIMINARY  PRELIMINARY  PRELIMINARY  PRELIMINARY     Preliminary report:  LEV attempted. Unable to even start exam. Patient became combative, confused, and refused exam. Nurse Tech also tried to convince her to have exam to no avail.  Alaney Witter D, RVS 11/18/2011, 11:06 AM

## 2011-11-18 NOTE — Progress Notes (Signed)
Subjective: Delerious again today. Na up over this am.  No specific complaints.   Objective Vital signs in last 24 hours: Filed Vitals:   11/17/11 0533 11/17/11 1346 11/17/11 2228 11/18/11 0411  BP: 110/63 118/72 102/64 133/67  Pulse: 86 84 88 89  Temp: 98.5 F (36.9 C) 98.1 F (36.7 C) 98.9 F (37.2 C) 98.4 F (36.9 C)  TempSrc: Oral Oral Oral Oral  Resp: 16 16 16 20   Height:      Weight:      SpO2: 97% 97% 99% 99%   Weight change:   Intake/Output Summary (Last 24 hours) at 11/18/11 1330 Last data filed at 11/18/11 1257  Gross per 24 hour  Intake 608.25 ml  Output    525 ml  Net  83.25 ml   Labs: Basic Metabolic Panel:  Lab 11/18/11 4540 11/18/11 0640 11/18/11 0345 11/17/11 2305 11/17/11 1930 11/17/11 1517 11/17/11 1130  NA 128* 133* 126* 123* 125* 123* 124*  K 3.9 3.7 3.8 4.1 3.5 3.7 3.5  CL 94* 100 93* 90* 92* 89* 90*  CO2 26 27 28 27 26 25 26   GLUCOSE 100* 87 86 86 172* 101* 164*  BUN 12 12 13 16 15 14 11   CREATININE 0.48* 0.46* 0.48* 0.50 0.47* 0.48* 0.48*  ALB -- -- -- -- -- -- --  CALCIUM 9.3 8.2* 8.4 8.4 8.6 8.5 8.7  PHOS -- -- -- -- -- -- --   CBC:  Lab 11/14/11 0540 11/13/11 1540 11/11/11 1813  WBC 2.2* 2.1* 4.6  NEUTROABS -- -- --  HGB 10.6* 10.5* 11.3*  HCT 29.9* 30.2* 31.2*  MCV 89.3 90.4 87.9  PLT 330 313 311   Physical Exam:  Blood pressure 133/67, pulse 89, temperature 98.4 F (36.9 C), temperature source Oral, resp. rate 20, height 5\' 2"  (1.575 m), weight 51.3 kg (113 lb 1.5 oz), SpO2 99.00%.  Gen: thin, cachectic, strange affect, hollers from time to time, does not make appropriate verbal responses  Skin: no rash, cyanosis  Neck: no JVD, no bruits or LAN  Chest: clear bilat to bases, no rales or wheezing  Heart: regular, no rub or gallop  Abdomen: soft, scaphoid, no ascites  Ext: no edema  Neuro: alert, Ox3, no focal deficit  Heme/Lymph: no bruising or LAN   Assessment/Recommendations  1. Hyponatremia due to SIADH- better after 3%  saline. Will d/c hypertonic saline and start back on salt tabs at 3 gm tid as maintenance therapy in addition to fluid restriction. This is starting dose per UpToDate. If needed, can add po lasix since her urine osm is particularly high.     2. UTI 3. Severe RA 4. Psychosis- waxing and waning. Per primary service.  5. Cervicomedullary compression- as seen on MRI.  This is not causing SIADH, it is too low in the brain.    Vinson Moselle  MD [employer] [phone] pgr    [phone] cell 11/18/2011, 1:30 PM

## 2011-11-19 DIAGNOSIS — E236 Other disorders of pituitary gland: Secondary | ICD-10-CM

## 2011-11-19 DIAGNOSIS — M069 Rheumatoid arthritis, unspecified: Secondary | ICD-10-CM

## 2011-11-19 DIAGNOSIS — R197 Diarrhea, unspecified: Secondary | ICD-10-CM

## 2011-11-19 DIAGNOSIS — E782 Mixed hyperlipidemia: Secondary | ICD-10-CM

## 2011-11-19 LAB — BASIC METABOLIC PANEL
Calcium: 9.1 mg/dL (ref 8.4–10.5)
Chloride: 93 mEq/L — ABNORMAL LOW (ref 96–112)
Creatinine, Ser: 0.44 mg/dL — ABNORMAL LOW (ref 0.50–1.10)
GFR calc Af Amer: >90 mL/min (ref >90)
Sodium: 127 mEq/L — ABNORMAL LOW (ref 135–145)

## 2011-11-19 MED ORDER — POTASSIUM CHLORIDE CRYS ER 20 MEQ PO TBCR
20.0000 meq | EXTENDED_RELEASE_TABLET | Freq: Every day | ORAL | Status: DC
  Administered 2011-11-19 – 2011-11-25 (×3): 20 meq via ORAL
  Filled 2011-11-19 (×7): qty 1

## 2011-11-19 NOTE — Progress Notes (Signed)
Subjective: Patient  Is alert but still confused.    Objective: Vital signs in last 24 hours: Filed Vitals:   11/15/11 0615 11/15/11 1435 11/15/11 2053 11/16/11 0513  BP: 153/81 134/74 123/73 153/86  Pulse: 64 64 73 70  Temp: 97.5 F (36.4 C) 97.9 F (36.6 C) 98 F (36.7 C) 98.2 F (36.8 C)  TempSrc: Axillary Oral Oral Oral  Resp: 18 18 18 16   Height:      Weight:      SpO2: 100% 100% 97% 98%   Weight change:   Intake/Output Summary (Last 24 hours) at 11/16/11 1021 Last data filed at 11/16/11 0901  Gross per 24 hour  Intake 650.42 ml  Output      0 ml  Net 650.42 ml    Physical Exam: General: Awake and alert, No acute distress. Neck: Supple CV: S1 and S2 Lungs: Clear to ascultation bilaterally Abdomen: Soft, Nontender, Nondistended, +bowel sounds. Ext: Good pulses. Trace edema.  Lab Results:  Basename 11/16/11 0840 11/16/11 0440  NA 121* 123*  K 4.1 3.9  CL 88* 91*  CO2 26 26  GLUCOSE 104* 98  BUN 7 8  CREATININE 0.47* 0.41*  CALCIUM 9.0 8.8  MG -- --  PHOS -- --    Basename 11/14/11 0540 11/13/11 1540  WBC 2.2* 2.1*  NEUTROABS -- --  HGB 10.6* 10.5*  HCT 29.9* 30.2*  MCV 89.3 90.4  PLT 330 313   Micro Results: Recent Results (from the past 240 hour(s))  URINE CULTURE     Status: Normal (Preliminary result)   Collection Time   11/11/11  6:31 PM      Component Value Range Status Comment   Specimen Description URINE, RANDOM   Final    Special Requests NONE   Final    Culture  Setup Time [id number]   Final    Colony Count >=100,000 COLONIES/ML   Final    Culture GRAM NEGATIVE RODS   Final    Report Status PENDING   Incomplete     Studies/Results: Mr Brain Wo Contrast  11/15/2011  *RADIOLOGY REPORT*  Clinical Data: Worsening confusion and lethargy.  Hyponatremia. Rheumatoid arthritis.  MRI HEAD WITHOUT CONTRAST  Technique:  Multiplanar, multiecho pulse sequences of the brain and surrounding structures were obtained according to standard  protocol without intravenous contrast.  Comparison: 10/25/2011.  Findings: Motion degraded exam.  Fast sequencing had to be utilized.  Prominent widening of the predental space with posterior superior projection of the dens which is causing severe compression of the cervical medullary junction.  Within the compressed cervical medullary junction, slight increased signal suggesting gliosis and / or edema.  Upper cervical cord below this compression is atrophic.  No acute infarct.  Global atrophy, ventricular prominence probably related to atrophy rather hydrocephalus.  Mild small vessel disease type changes.  No intracranial hemorrhage.  No intracranial mass lesion detected on this unenhanced exam.  Major intracranial vascular structures are patent.  IMPRESSION: Severe cervical medullary compression as detailed above.  No acute infarct.  Global atrophy.  Mild small vessel disease type changes.  Critical Value/emergent results were called by telephone at the time of interpretation on 11/15/2011 and  at 2:45 p.m.  to  Dr. Betti Cruz, who verbally acknowledged these results.  Original Report Authenticated By: Fuller Canada, M.D.    Medications: I have reviewed the patient's current medications. Scheduled Meds:    . ciprofloxacin  200 mg Intravenous Q12H  . ciprofloxacin  250 mg Oral BID  .  docusate sodium  100 mg Oral BID  . enoxaparin  40 mg Subcutaneous QHS  . folic acid  1 mg Oral Daily  . phenytoin  200 mg Oral QHS  . senna  1 tablet Oral BID  . sodium chloride  3 mL Intravenous Q12H  . DISCONTD: demeclocycline  300 mg Oral BID  . DISCONTD: sodium chloride  2 g Oral TID WC   Continuous Infusions:    . sodium chloride (hypertonic) 12.5 mL/hr (11/15/11 2113)   PRN Meds:.acetaminophen, acetaminophen, hydrALAZINE, ondansetron (ZOFRAN) IV, ondansetron  Assessment/Plan: Altered mental status/acute delirium/metabolic encephalopathy Probably due to urinary tract infection and hyponatremia.  Patient  had MRI of the brain on 10/25/2011 which showed no acute infarct.  Continue treatment for urinary tract infection.  Repeat MRI yesterday did not show any acute infarct. Altered mental status wax and wane . updated patient's son today. He wants everything done. He would like her to continue to BE full code.  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 Nephrology is following. Sodium improved with hypertonic saline. Patient is now on salt tablets.   Gram-negative rod urinary tract infection Patient treated with Cipro for 7 days. DC Cipro and recheck UA.    History of seizures from hyponatremia Continue Dilantin.  History of rheumatoid arthritis Methotrexate has been discontinued.  Continue Folate.  Hypertension Stable.  Not on any antihypertensive medications.  Anemia Thought to be due to iron deficiency.  Supplemental iron held.  Hyperlipidemia Stable.  Prominent widening of the predental space with posterior superior projection of the dens /cervical medullary compression Per Dr. Reddy's note this was discussed with neurosurgery and family. Family is discussing it among themselves and will decide if they would like more aggressive workup by neurosurgery. Patient most likely would not be a good surgical candidate.  Discussed with his son today. He does not want anything done at this time.  Prophylaxis Lovenox.  CODE STATUS Full code.   Disposition Pending.  [name] can be reached at [facility] [phone] or [phone] (lost his cell phone), Work [phone]. Discussed with patient's son [name].  Carollee Massed, MD 11/19/2011, 10:21 AM Pager [phone]

## 2011-11-19 NOTE — Progress Notes (Addendum)
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 having cancer - I cannot follow the thread of her conversations today; tried to talk to her about her sodium - unable to stay on track  Objective:  Vital signs in last 24 hours: Filed Vitals:   11/18/11 0411 11/18/11 1400 11/18/11 2222 11/19/11 0457  BP: 133/67  125/73 106/63  Pulse: 89  74 80  Temp: 98.4 F (36.9 C)  98.1 F (36.7 C) 98.2 F (36.8 C)  TempSrc: Oral  Oral Oral  Resp: 20 22 18 18   Height:      Weight:      SpO2: 99%  95% 100%   Intake/Output Summary (Last 24 hours) at 11/19/11 0928 Last data filed at 11/19/11 4098  Gross per 24 hour  Intake    360 ml  Output    275 ml  Net     85 ml     Physical Exam:  Blood pressure 106/63, pulse 80, temperature 98.2 F (36.8 C), temperature source Oral, resp. rate 18, height 5\' 2"  (1.575 m), weight 51.3 kg (113 lb 1.5 oz), SpO2 100.00%. Thin BF  NAD  Talking about WW[place].  Affect unusual Lungs clear Regular rhythm No rub Abdomen soft, scaphoid, NT No edema of LE's Neuro as above  Lab 11/19/11 0448 11/18/11 1908 11/18/11 1140 11/18/11 0640 11/18/11 0345 11/17/11 2305 11/17/11 1930  NA 127* 127* 128* 133* 126* 123* 125*  K 3.4* 3.7 3.9 3.7 3.8 4.1 3.5  CL 93* 94* 94* 100 93* 90* 92*  CO2 27 27 26 27 28 27 26   GLUCOSE 154* 110* 100* 87 86 86 172*  BUN 12 12 12 12 13 16 15   CREATININE 0.44* 0.49* 0.48* 0.46* 0.48* 0.50 0.47*  ALB -- -- -- -- -- -- --  CALCIUM 9.1 8.6 9.3 8.2* 8.4 8.4 8.6  PHOS -- -- -- -- -- -- --    Sodium  Date/Time Value Range Status  11/19/2011  4:48 AM 127* 135-145 (mEq/L) Final  11/18/2011  7:08 PM 127* 135-145 (mEq/L) Final  11/18/2011 11:40 AM 128* 135-145 (mEq/L) Final  11/18/2011  6:40 AM 133* 135-145 (mEq/L) Final  11/18/2011  3:45 AM 126* 135-145 (mEq/L) Final  11/17/2011 11:05 PM 123* 135-145 (mEq/L) Final  11/17/2011  7:30 PM 125* 135-145 (mEq/L) Final  11/17/2011  3:17 PM 123* 135-145 (mEq/L) Final  11/17/2011 11:30  AM 124* 135-145 (mEq/L) Final  11/17/2011  8:55 AM 121* 135-145 (mEq/L) Final  11/17/2011  4:00 AM 123* 135-145 (mEq/L) Final  11/16/2011 11:40 PM 121* 135-145 (mEq/L) Final  11/16/2011  4:58 PM 117* 135-145 (mEq/L) Final  11/16/2011  1:05 PM 120* 135-145 (mEq/L) Final  11/16/2011  8:40 AM 121* 135-145 (mEq/L) Final  11/16/2011  4:40 AM 123* 135-145 (mEq/L) Final  11/16/2011  1:22 AM 120* 135-145 (mEq/L) Final  11/15/2011  9:50 PM 122* 135-145 (mEq/L) Final  11/15/2011  3:25 PM 122* 135-145 (mEq/L) Final  11/15/2011  6:50 AM 123* 135-145 (mEq/L) Final  11/14/2011 11:05 PM 126* 135-145 (mEq/L) Final  11/14/2011  3:25 PM 126* 135-145 (mEq/L) Final  11/14/2011  5:40 AM 126* 135-145 (mEq/L) Final  11/13/2011 11:27 PM 125* 135-145 (mEq/L) Final  11/13/2011  3:40 PM 122* 135-145 (mEq/L) Final  11/13/2011  8:00 AM 121* 135-145 (mEq/L) Final  11/12/2011 11:00 PM 121* 135-145 (mEq/L) Final  11/12/2011  3:59 PM 121* 135-145 (mEq/L) Final  11/12/2011  7:10 AM 122* 135-145 (mEq/L) Final  11/11/2011 11:57 PM  119* 135-145 (mEq/L) Final  11/11/2011  6:13 PM 121* 135-145 (mEq/L) Final  10/28/2011  5:20 AM 128* 135-145 (mEq/L) Final  10/27/2011  5:54 PM 126* 135-145 (mEq/L) Final  10/27/2011  2:25 PM 127* 135-145 (mEq/L) Final  10/27/2011  9:15 AM 128* 135-145 (mEq/L) Final  10/27/2011  5:07 AM 127* 135-145 (mEq/L) Final  10/26/2011  6:20 PM 126* 135-145 (mEq/L) Final  10/26/2011  8:32 AM 127* 135-145 (mEq/L) Final  10/26/2011  3:31 AM 129* 135-145 (mEq/L) Final  10/25/2011  6:27 PM 126* 135-145 (mEq/L) Final  10/25/2011  2:25 PM 127* 135-145 (mEq/L) Final  10/25/2011 10:45 AM 124* 135-145 (mEq/L) Final  10/25/2011  6:00 AM 121* 135-145 (mEq/L) Final  10/24/2011  9:16 PM 120* 135-145 (mEq/L) Final  06/20/2011  5:01 AM 129* 135-145 (mEq/L) Final  06/19/2011  4:30 PM 127* 135-145 (mEq/L) Final  06/19/2011 12:46 PM 121* 135-145 (mEq/L) Final  06/19/2011  7:48 AM 124* 135-145 (mEq/L) Final  06/19/2011  4:48 AM 124* 135-145 (mEq/L) Final    06/19/2011 12:09 AM 123* 135-145 (mEq/L) Final  06/18/2011  8:27 PM 124* 135-145 (mEq/L) Final  06/18/2011  5:18 PM 122* 135-145 (mEq/L) Final  06/18/2011 12:25 PM 120* 135-145 (mEq/L) Final  06/18/2011  4:34 AM 124* 135-145 (mEq/L) Final  06/17/2011 11:36 PM 122* 135-145 (mEq/L) Final  06/17/2011  7:26 PM 120* 135-145 (mEq/L) Final  06/17/2011  4:15 PM 121* 135-145 (mEq/L) Final  06/17/2011 11:41 AM 124* 135-145 (mEq/L) Final  06/17/2011  7:51 AM 124* 135-145 (mEq/L) Final  06/17/2011  3:55 AM 122* 135-145 (mEq/L) Final  06/16/2011  8:04 PM 121* 135-145 (mEq/L) Final  06/16/2011  2:27 PM 122* 135-145 (mEq/L) Final  06/16/2011  4:44 AM 123* 135-145 (mEq/L) Final  06/15/2011  4:53 AM 125* 135-145 (mEq/L) Final  06/14/2011 11:21 AM 118* 135-145 (mEq/L) Final  02/22/2011 10:05 AM 130* 135-145 (mEq/L) Final  02/22/2011  3:52 AM 128* 135-145 (mEq/L) Final  02/21/2011  9:58 PM 127* 135-145 (mEq/L) Final  02/21/2011  4:12 PM 129* 135-145 (mEq/L) Final  02/21/2011 10:23 AM 128* 135-145 (mEq/L) Final  02/21/2011  6:37 AM 128* 135-145 (mEq/L) Final  02/21/2011 12:00 AM 126* 135-145 (mEq/L) Final  02/20/2011  4:00 PM 125* 135-145 (mEq/L) Final  02/20/2011 10:17 AM 125* 135-145 (mEq/L) Final  02/20/2011  6:29 AM 125* 135-145 (mEq/L) Final  02/19/2011 10:42 PM 123* 135-145 (mEq/L) Final  02/19/2011  4:34 PM 123* 135-145 (mEq/L) Final  02/19/2011  5:00 AM 124* 135-145 (mEq/L) Final  02/18/2011 10:13 PM 121* 135-145 (mEq/L) Final  02/18/2011  6:16 AM 123* 135-145 (mEq/L) Final  02/17/2011 12:49 PM 111* 135-145 (mEq/L) Final  09/13/2010  1:11 PM 133* 135-145 (mEq/L) Final  03/27/2008  8:39 PM 139  135-145 (mEq/L) Final  10/26/2007  7:50 PM 140  135-145 (mEq/L) Final  02/02/2007  2:54 PM 133*  Final  09/08/2006 10:18 PM 139  135-145 (mEq/L) Final     Scheduled Medications . docusate sodium  100 mg Oral BID . enoxaparin  40 mg Subcutaneous QHS . folic acid  1 mg Oral Daily . phenytoin  200 mg Oral QHS . senna   1 tablet Oral BID . sodium chloride  3 mL Intravenous Q12H . sodium chloride  3 g Oral TID WC . DISCONTD: ciprofloxacin  250 mg Oral BID    ASSESSMENT/RECOMMENDATIONS 1. Hyponatremia due to SIADH-  Recurring problem since 08/2010 ; prior workups have included normal TSH (09/2011), normal cortisol (10/2011), negative head CT, negative CXR, negative mammogram, consistently high  UOSM for POSM, better after 3% saline and stable now on salt tablets plus free water restriction.   If needed, can add po lasix (could use 10-20 mg/day) to help with free water clearance but would not start at this point as Na stable.  2. UTI 3. Severe RA 4. Psychosis- waxing and waning. Per primary service.  5. Cervicomedullary compression- as seen on MRI. This is not causing SIADH, it is too low in the brain.  6. Hypokalemia - will start small dose KDur   Will sign off at this time.  Please call if further assistance is needed.   Camille Bal, MD [facility] [phone] Pager 11/19/2011, 9:28 AM

## 2011-11-20 LAB — BASIC METABOLIC PANEL
Calcium: 8.3 mg/dL — ABNORMAL LOW (ref 8.4–10.5)
Creatinine, Ser: 0.42 mg/dL — ABNORMAL LOW (ref 0.50–1.10)
GFR calc Af Amer: >90 mL/min (ref >90)
GFR calc non Af Amer: >90 mL/min (ref >90)
Sodium: 128 mEq/L — ABNORMAL LOW (ref 135–145)

## 2011-11-20 MED ORDER — HALOPERIDOL LACTATE 5 MG/ML IJ SOLN
INTRAMUSCULAR | Status: AC
  Filled 2011-11-20: qty 1

## 2011-11-20 MED ORDER — HALOPERIDOL LACTATE 5 MG/ML IJ SOLN
1.0000 mg | Freq: Four times a day (QID) | INTRAMUSCULAR | Status: DC | PRN
  Administered 2011-11-20 – 2011-11-25 (×5): 1 mg via INTRAMUSCULAR
  Filled 2011-11-20 (×5): qty 1

## 2011-11-20 NOTE — Progress Notes (Signed)
Subjective: Patient is confused and combative this morning.   Objective: Vital signs in last 24 hours: Filed Vitals:   11/15/11 0615 11/15/11 1435 11/15/11 2053 11/16/11 0513  BP: 153/81 134/74 123/73 153/86  Pulse: 64 64 73 70  Temp: 97.5 F (36.4 C) 97.9 F (36.6 C) 98 F (36.7 C) 98.2 F (36.8 C)  TempSrc: Axillary Oral Oral Oral  Resp: 18 18 18 16   Height:      Weight:      SpO2: 100% 100% 97% 98%   Weight change:   Intake/Output Summary (Last 24 hours) at 11/16/11 1021 Last data filed at 11/16/11 0901  Gross per 24 hour  Intake 650.42 ml  Output      0 ml  Net 650.42 ml    Physical Exam: General: Awake and alert, Neck: Supple CV: S1 and S2 Lungs: Clear to ascultation bilaterally Abdomen: Soft, Nontender, Nondistended, +bowel sounds. Ext: Good pulses. Trace edema.  Lab Results:  Basename 11/16/11 0840 11/16/11 0440  NA 121* 123*  K 4.1 3.9  CL 88* 91*  CO2 26 26  GLUCOSE 104* 98  BUN 7 8  CREATININE 0.47* 0.41*  CALCIUM 9.0 8.8  MG -- --  PHOS -- --    Basename 11/14/11 0540 11/13/11 1540  WBC 2.2* 2.1*  NEUTROABS -- --  HGB 10.6* 10.5*  HCT 29.9* 30.2*  MCV 89.3 90.4  PLT 330 313   Micro Results: Recent Results (from the past 240 hour(s))  URINE CULTURE     Status: Normal (Preliminary result)   Collection Time   11/11/11  6:31 PM      Component Value Range Status Comment   Specimen Description URINE, RANDOM   Final    Special Requests NONE   Final    Culture  Setup Time [id number]   Final    Colony Count >=100,000 COLONIES/ML   Final    Culture GRAM NEGATIVE RODS   Final    Report Status PENDING   Incomplete     Studies/Results: Mr Brain Wo Contrast  11/15/2011  *RADIOLOGY REPORT*  Clinical Data: Worsening confusion and lethargy.  Hyponatremia. Rheumatoid arthritis.  MRI HEAD WITHOUT CONTRAST  Technique:  Multiplanar, multiecho pulse sequences of the brain and surrounding structures were obtained according to standard protocol  without intravenous contrast.  Comparison: 10/25/2011.  Findings: Motion degraded exam.  Fast sequencing had to be utilized.  Prominent widening of the predental space with posterior superior projection of the dens which is causing severe compression of the cervical medullary junction.  Within the compressed cervical medullary junction, slight increased signal suggesting gliosis and / or edema.  Upper cervical cord below this compression is atrophic.  No acute infarct.  Global atrophy, ventricular prominence probably related to atrophy rather hydrocephalus.  Mild small vessel disease type changes.  No intracranial hemorrhage.  No intracranial mass lesion detected on this unenhanced exam.  Major intracranial vascular structures are patent.  IMPRESSION: Severe cervical medullary compression as detailed above.  No acute infarct.  Global atrophy.  Mild small vessel disease type changes.  Critical Value/emergent results were called by telephone at the time of interpretation on 11/15/2011 and  at 2:45 p.m.  to  Dr. Betti Cruz, who verbally acknowledged these results.  Original Report Authenticated By: Fuller Canada, M.D.    Medications: I have reviewed the patient's current medications. Scheduled Meds:    . ciprofloxacin  200 mg Intravenous Q12H  . ciprofloxacin  250 mg Oral BID  . docusate sodium  100 mg Oral BID  . enoxaparin  40 mg Subcutaneous QHS  . folic acid  1 mg Oral Daily  . phenytoin  200 mg Oral QHS  . senna  1 tablet Oral BID  . sodium chloride  3 mL Intravenous Q12H  . DISCONTD: demeclocycline  300 mg Oral BID  . DISCONTD: sodium chloride  2 g Oral TID WC   Continuous Infusions:    . sodium chloride (hypertonic) 12.5 mL/hr (11/15/11 2113)   PRN Meds:.acetaminophen, acetaminophen, hydrALAZINE, ondansetron (ZOFRAN) IV, ondansetron  Assessment/Plan: Altered mental status/acute delirium/metabolic encephalopathy Probably due to urinary tract infection and hyponatremia.  Patient had MRI of  the brain on 10/25/2011 which showed no acute infarct.  Patient is combative this morning and trying to lock staph out of her room. Review of patient's record and she's had several episodes of delirium once with her hyponatremia not a time with bladder infection. Discussed this with his son with her son. Recheck another UA. Son would like everything done . 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 Nephrology signed off. Sodium improved with hypertonic saline. Patient is now on salt tablets.   Gram-negative rod urinary tract infection Patient treated with Cipro for 7 days.    History of seizures from hyponatremia Continue Dilantin.  History of rheumatoid arthritis Methotrexate has been discontinued.  Continue Folate.  Hypertension Stable.  Not on any antihypertensive medications.  Anemia Thought to be due to iron deficiency.  Supplemental iron held.  Hyperlipidemia Stable.  Prominent widening of the predental space with posterior superior projection of the dens /cervical medullary compression Per Dr. Reddy's note this was discussed with neurosurgery and family. Family is discussing it among themselves and will decide if they would like more aggressive workup by neurosurgery. Patient most likely would not be a good surgical candidate.  Discussed with his son today. He does not want anything done at this time.  Prophylaxis Lovenox.  CODE STATUS Full code.   Disposition Pending.  [name] can be reached at [facility] [phone] or [phone] (lost his cell phone), Work [phone]. Discussed with patient's son [name].  Carollee Massed, MD 11/20/2011, 10:21 AM Pager [phone]

## 2011-11-20 NOTE — Progress Notes (Signed)
Please note that pt has refused all PO meds this am, including Na tablets.

## 2011-11-21 DIAGNOSIS — E782 Mixed hyperlipidemia: Secondary | ICD-10-CM

## 2011-11-21 DIAGNOSIS — M069 Rheumatoid arthritis, unspecified: Secondary | ICD-10-CM

## 2011-11-21 DIAGNOSIS — E236 Other disorders of pituitary gland: Secondary | ICD-10-CM

## 2011-11-21 DIAGNOSIS — R197 Diarrhea, unspecified: Secondary | ICD-10-CM

## 2011-11-21 LAB — URINALYSIS, ROUTINE W REFLEX MICROSCOPIC
Ketones, ur: NEGATIVE mg/dL
Nitrite: NEGATIVE
Protein, ur: NEGATIVE mg/dL

## 2011-11-21 LAB — URINE MICROSCOPIC-ADD ON

## 2011-11-21 LAB — BASIC METABOLIC PANEL
Calcium: 8.6 mg/dL (ref 8.4–10.5)
GFR calc Af Amer: >90 mL/min (ref >90)
GFR calc non Af Amer: >90 mL/min (ref >90)
Potassium: 3.5 mEq/L (ref 3.5–5.1)
Sodium: 126 mEq/L — ABNORMAL LOW (ref 135–145)

## 2011-11-21 MED ORDER — AMLODIPINE BESYLATE 5 MG PO TABS
5.0000 mg | ORAL_TABLET | Freq: Every day | ORAL | Status: DC
  Administered 2011-11-21: 5 mg via ORAL
  Filled 2011-11-21 (×5): qty 1

## 2011-11-21 NOTE — Progress Notes (Signed)
Subjective: Patient calm and alert today.   Objective: Vital signs in last 24 hours: Filed Vitals:   11/15/11 0615 11/15/11 1435 11/15/11 2053 11/16/11 0513  BP: 153/81 134/74 123/73 153/86  Pulse: 64 64 73 70  Temp: 97.5 F (36.4 C) 97.9 F (36.6 C) 98 F (36.7 C) 98.2 F (36.8 C)  TempSrc: Axillary Oral Oral Oral  Resp: 18 18 18 16   Height:      Weight:      SpO2: 100% 100% 97% 98%   Weight change:   Intake/Output Summary (Last 24 hours) at 11/16/11 1021 Last data filed at 11/16/11 0901  Gross per 24 hour  Intake 650.42 ml  Output      0 ml  Net 650.42 ml    Physical Exam: General: Awake and alert, Neck: Supple CV: S1 and S2 Lungs: Clear to ascultation bilaterally Abdomen: Soft, Nontender, Nondistended, +bowel sounds. Ext: Good pulses. Trace edema.  Lab Results:  Basename 11/16/11 0840 11/16/11 0440  NA 121* 123*  K 4.1 3.9  CL 88* 91*  CO2 26 26  GLUCOSE 104* 98  BUN 7 8  CREATININE 0.47* 0.41*  CALCIUM 9.0 8.8  MG -- --  PHOS -- --    Basename 11/14/11 0540 11/13/11 1540  WBC 2.2* 2.1*  NEUTROABS -- --  HGB 10.6* 10.5*  HCT 29.9* 30.2*  MCV 89.3 90.4  PLT 330 313   Micro Results: Recent Results (from the past 240 hour(s))  URINE CULTURE     Status: Normal (Preliminary result)   Collection Time   11/11/11  6:31 PM      Component Value Range Status Comment   Specimen Description URINE, RANDOM   Final    Special Requests NONE   Final    Culture  Setup Time [id number]   Final    Colony Count >=100,000 COLONIES/ML   Final    Culture GRAM NEGATIVE RODS   Final    Report Status PENDING   Incomplete     Studies/Results: Mr Brain Wo Contrast  11/15/2011  *RADIOLOGY REPORT*  Clinical Data: Worsening confusion and lethargy.  Hyponatremia. Rheumatoid arthritis.  MRI HEAD WITHOUT CONTRAST  Technique:  Multiplanar, multiecho pulse sequences of the brain and surrounding structures were obtained according to standard protocol without intravenous  contrast.  Comparison: 10/25/2011.  Findings: Motion degraded exam.  Fast sequencing had to be utilized.  Prominent widening of the predental space with posterior superior projection of the dens which is causing severe compression of the cervical medullary junction.  Within the compressed cervical medullary junction, slight increased signal suggesting gliosis and / or edema.  Upper cervical cord below this compression is atrophic.  No acute infarct.  Global atrophy, ventricular prominence probably related to atrophy rather hydrocephalus.  Mild small vessel disease type changes.  No intracranial hemorrhage.  No intracranial mass lesion detected on this unenhanced exam.  Major intracranial vascular structures are patent.  IMPRESSION: Severe cervical medullary compression as detailed above.  No acute infarct.  Global atrophy.  Mild small vessel disease type changes.  Critical Value/emergent results were called by telephone at the time of interpretation on 11/15/2011 and  at 2:45 p.m.  to  Dr. Betti Cruz, who verbally acknowledged these results.  Original Report Authenticated By: Fuller Canada, M.D.    Medications: I have reviewed the patient's current medications. Scheduled Meds:    . ciprofloxacin  200 mg Intravenous Q12H  . ciprofloxacin  250 mg Oral BID  . docusate sodium  100  mg Oral BID  . enoxaparin  40 mg Subcutaneous QHS  . folic acid  1 mg Oral Daily  . phenytoin  200 mg Oral QHS  . senna  1 tablet Oral BID  . sodium chloride  3 mL Intravenous Q12H  . DISCONTD: demeclocycline  300 mg Oral BID  . DISCONTD: sodium chloride  2 g Oral TID WC   Continuous Infusions:    . sodium chloride (hypertonic) 12.5 mL/hr (11/15/11 2113)   PRN Meds:.acetaminophen, acetaminophen, hydrALAZINE, ondansetron (ZOFRAN) IV, ondansetron  Assessment/Plan: Altered mental status/acute delirium/metabolic encephalopathy Probably due to urinary tract infection and hyponatremia.  Patient had MRI of the brain on  10/25/2011 which showed no acute infarct. Patient is calm today. She is very apologetic about yesterday.  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 Nephrology signed off. Sodium improved with hypertonic saline. Patient is now on salt tablets.   Gram-negative rod urinary tract infection Patient treated with Cipro for 7 days.    History of seizures from hyponatremia Continue Dilantin.  History of rheumatoid arthritis Methotrexate has been discontinued.  Continue Folate.  Hypertension Stable.  Not on any antihypertensive medications.  Anemia Thought to be due to iron deficiency.  Supplemental iron held.  Hyperlipidemia Stable.  Prominent widening of the predental space with posterior superior projection of the dens /cervical medullary compression Per Dr. Reddy's note this was discussed with neurosurgery and family. Family is discussing it among themselves and will decide if they would like more aggressive workup by neurosurgery. Patient most likely would not be a good surgical candidate.  Discussed with his son today. He does not want anything done at this time.  Prophylaxis Lovenox.  CODE STATUS Full code.   Disposition DC to skilled nursing facility if sodium is stable. Pending.  [name] can be reached at [facility] [phone] or [phone] (lost his cell phone), Work [phone]. Discussed with patient's son [name].  Carollee Massed, MD 11/21/2011, 10:21 AM Pager [phone]

## 2011-11-21 NOTE — Progress Notes (Signed)
Physical Therapy Note  Pt with prominent widening of the predental space with posterior superior projection of the dens /cervical medullary compression and per MD note family would not like anything done at this time.  Will need MD to update activity recommendations and any precautions with mobility (neck ROM restrictions/brace/etc) prior to therapy treatment.  THanks.  Zenovia Jarred, PT Pager: [phone]

## 2011-11-22 LAB — BASIC METABOLIC PANEL
BUN: 9 mg/dL (ref 6–23)
Creatinine, Ser: 0.42 mg/dL — ABNORMAL LOW (ref 0.50–1.10)
GFR calc non Af Amer: >90 mL/min (ref >90)
Glucose, Bld: 98 mg/dL (ref 70–99)
Potassium: 3.6 mEq/L (ref 3.5–5.1)

## 2011-11-22 LAB — CBC
HCT: 28.3 % — ABNORMAL LOW (ref 36.0–46.0)
Hemoglobin: 9.9 g/dL — ABNORMAL LOW (ref 12.0–15.0)
MCH: 31.7 pg (ref 26.0–34.0)
MCHC: 35 g/dL (ref 30.0–36.0)
MCV: 90.7 fL (ref 78.0–100.0)

## 2011-11-22 MED ORDER — LORAZEPAM 2 MG/ML IJ SOLN: 1.0000 mg | Freq: Once | INTRAMUSCULAR | Status: DC

## 2011-11-22 NOTE — Progress Notes (Signed)
Interim note: [name] is a 78 year old female with  h/o severe RA with cervical subluxation, h/o hyponatremia on previous hospitalization, presents again with altered mental status and hyponatremia. Hyponatremic is thought to be due to SIADH. Patient was admitted in December  2012 with  frequent falls, psychosis in setting of steroid use, UTI and hyponatremia, Sodium of 118. In August 2012 patient was admitted with seizures thought to be secondary to severe hyponatremia  with Na 111. She was also admitted again  In 10/24/2011 with a fall and sodium of  120. She was given Na tablets and fluid restriction and this improved.  This hospitalization patient was admitted from [facility] with confusion and refusing to take her medications with sodium of 121. Nephrology was consult.  Initially patient was given  salt tablets 2 g 3 times a day increased from her home dose of 1 g 3 times a day. There was not much improvement in her sodium so she was placed on hypertonic saline by nephrology. Sodium increased to 129. The hypertonic saline was discontinued and patient was placed back on salt tablets 3 g 3 times daily. Sodium has been stable. Patient however is refusing to take her medications so the sodium for the past day has not increased. She had MRI done during this hospitalization that did not show any acute infarct did show severe cervical medullary compression. Dr Betti Cruz discussed the findings with Dr. Gerlene Fee, neurosurgery. Dr. Alen Bleacher recommended that it would be a large surgical undertaking for the patient and given patient's age and other co-morbidities, recommended conservative management. the risks and benefits was discussed with patient's son [name], one of the health care power of attorney, he discussed this with his brother [name] and they've decided no intervention at this time.  Patient however continues to have periods where she is lucid and then other days where she is combative and agitated.  Per her son she had no history of dementia. Patient has already been treated for urinary tract infection so she is waiting for mental status to improve prior to being discharged back to [facility]. Not sure if they will take her if she is combative. We may need to start maybe low-dose antipsychotic. No further seizure episode noted. This was discussed with patient's son who is concerned about her agitated state at times. Dr. Arlean Hopping recommend making the patient DO NOT RESUSCITATE. Discussed this with patient's son [name]. He does not want her to be DO NOT RESUSCITATE. He would like everything done medically.           Subjective: Patient  is combative today and refusing to take her medications.    Objective: Vital signs in last 24 hours: Filed Vitals:   11/15/11 0615 11/15/11 1435 11/15/11 2053 11/16/11 0513  BP: 153/81 134/74 123/73 153/86  Pulse: 64 64 73 70  Temp: 97.5 F (36.4 C) 97.9 F (36.6 C) 98 F (36.7 C) 98.2 F (36.8 C)  TempSrc: Axillary Oral Oral Oral  Resp: 18 18 18 16   Height:      Weight:      SpO2: 100% 100% 97% 98%   Weight change:   Intake/Output Summary (Last 24 hours) at 11/16/11 1021 Last data filed at 11/16/11 0901  Gross per 24 hour  Intake 650.42 ml  Output      0 ml  Net 650.42 ml    Physical Exam: General: Awake and alert, Neck: Supple CV: S1 and S2 Lungs: Clear to ascultation bilaterally Abdomen: Soft,  Nontender, Nondistended, +bowel sounds. Ext: Good pulses. Trace edema.  Lab Results:  Basename 11/16/11 0840 11/16/11 0440  NA 121* 123*  K 4.1 3.9  CL 88* 91*  CO2 26 26  GLUCOSE 104* 98  BUN 7 8  CREATININE 0.47* 0.41*  CALCIUM 9.0 8.8  MG -- --  PHOS -- --    Basename 11/14/11 0540 11/13/11 1540  WBC 2.2* 2.1*  NEUTROABS -- --  HGB 10.6* 10.5*  HCT 29.9* 30.2*  MCV 89.3 90.4  PLT 330 313   Micro Results: Recent Results (from the past 240 hour(s))  URINE CULTURE     Status: Normal (Preliminary result)    Collection Time   11/11/11  6:31 PM      Component Value Range Status Comment   Specimen Description URINE, RANDOM   Final    Special Requests NONE   Final    Culture  Setup Time [id number]   Final    Colony Count >=100,000 COLONIES/ML   Final    Culture GRAM NEGATIVE RODS   Final    Report Status PENDING   Incomplete     Studies/Results: Mr Brain Wo Contrast  11/15/2011  *RADIOLOGY REPORT*  Clinical Data: Worsening confusion and lethargy.  Hyponatremia. Rheumatoid arthritis.  MRI HEAD WITHOUT CONTRAST  Technique:  Multiplanar, multiecho pulse sequences of the brain and surrounding structures were obtained according to standard protocol without intravenous contrast.  Comparison: 10/25/2011.  Findings: Motion degraded exam.  Fast sequencing had to be utilized.  Prominent widening of the predental space with posterior superior projection of the dens which is causing severe compression of the cervical medullary junction.  Within the compressed cervical medullary junction, slight increased signal suggesting gliosis and / or edema.  Upper cervical cord below this compression is atrophic.  No acute infarct.  Global atrophy, ventricular prominence probably related to atrophy rather hydrocephalus.  Mild small vessel disease type changes.  No intracranial hemorrhage.  No intracranial mass lesion detected on this unenhanced exam.  Major intracranial vascular structures are patent.  IMPRESSION: Severe cervical medullary compression as detailed above.  No acute infarct.  Global atrophy.  Mild small vessel disease type changes.  Critical Value/emergent results were called by telephone at the time of interpretation on 11/15/2011 and  at 2:45 p.m.  to  Dr. Betti Cruz, who verbally acknowledged these results.  Original Report Authenticated By: Fuller Canada, M.D.    Medications: I have reviewed the patient's current medications. Scheduled Meds:    . ciprofloxacin  200 mg Intravenous Q12H  . ciprofloxacin  250 mg  Oral BID  . docusate sodium  100 mg Oral BID  . enoxaparin  40 mg Subcutaneous QHS  . folic acid  1 mg Oral Daily  . phenytoin  200 mg Oral QHS  . senna  1 tablet Oral BID  . sodium chloride  3 mL Intravenous Q12H  . DISCONTD: demeclocycline  300 mg Oral BID  . DISCONTD: sodium chloride  2 g Oral TID WC   Continuous Infusions:    . sodium chloride (hypertonic) 12.5 mL/hr (11/15/11 2113)   PRN Meds:.acetaminophen, acetaminophen, hydrALAZINE, ondansetron (ZOFRAN) IV, ondansetron  Assessment/Plan: Altered mental status/acute delirium/metabolic encephalopathy  Patient had MRI of the brain on 10/25/2011 which showed no acute infarct. Patient Was to be discharged today but she is again very combative. She is refusing all medications.   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 Nephrology signed off. Sodium improved with hypertonic saline. Patient is now on salt tablets.  sodium is stable when she takes the salt tablets but patient is refusing  to take her medications.   Gram-negative rod urinary tract infection Patient treated with Cipro for 7 days.    History of seizures from hyponatremia Continue Dilantin.  History of rheumatoid arthritis Methotrexate has been discontinued.  Continue Folate.  Hypertension Not on any antihypertensive medications. monitor  Anemia Thought to be due to iron deficiency.  Supplemental iron held.  Hyperlipidemia Stable.  Prominent widening of the predental space with posterior superior projection of the dens /cervical medullary compression Per Dr. Reddy's note this was discussed with neurosurgery and family. Family is discussing it among themselves and will decide if they would like more aggressive workup by neurosurgery. Patient most likely would not be a good surgical candidate.  Discussed with his son today. He does not want anything done at this time.  Prophylaxis Lovenox.  CODE STATUS Full code.   Disposition  DC to skilled nursing facility  when stable. Pending.  [name] can be reached at [facility] [phone] or [phone] (lost his cell phone), Work [phone]. Discussed with patient's son [name].  Carollee Massed, MD 11/22/2011, 10:21 AM Pager [phone]

## 2011-11-22 NOTE — Progress Notes (Signed)
Pt extremely confused & agitated this morning and has refused all medications. [name]

## 2011-11-23 DIAGNOSIS — E236 Other disorders of pituitary gland: Secondary | ICD-10-CM

## 2011-11-23 DIAGNOSIS — F29 Unspecified psychosis not due to a substance or known physiological condition: Secondary | ICD-10-CM | POA: Insufficient documentation

## 2011-11-23 DIAGNOSIS — R197 Diarrhea, unspecified: Secondary | ICD-10-CM

## 2011-11-23 DIAGNOSIS — F05 Delirium due to known physiological condition: Secondary | ICD-10-CM

## 2011-11-23 DIAGNOSIS — D649 Anemia, unspecified: Secondary | ICD-10-CM

## 2011-11-23 NOTE — Progress Notes (Signed)
Patient is asleep in room in her recliner.  Pt was agitated and combative at start of shift.  Patient was pulling on the computer cords in her room and trying to leave her room without assistance.  After administering haldol patient was still combative and determined to leave her room without the assistance of nursing staff.  Moved patient into hallway in her recliner and she sat quietly with me for approximately an hour.  Patient again started to become agitated and loud and she was moved back into her room where she settled down and has since been asleep.  She has her call light with her and we are continuing to monitor.

## 2011-11-23 NOTE — Progress Notes (Signed)
PT Cancellation Note  ___Treatment cancelled today due to medical issues with patient which prohibited therapy  ___ Treatment cancelled today due to patient receiving procedure or test   _X_ Treatment cancelled today due to patient's refusal to participate...the patient in bed with sheet pulled up over her head.  ___ Treatment cancelled today due to  Felecia Shelling  PTA [facility]  Acute  Rehab Pager     [phone]

## 2011-11-23 NOTE — Progress Notes (Signed)
[facility] PROGRESS NOTE  [name] ZOX:[id number] DOB: Feb 26, 1930 DOA: 11/11/2011 PCP: Renato Gails, TIFFANY, DO, DO  Assessment/Plan: 1. Encephalopathy/delirium/psychosis: Psychiatry consultation. 2. Hyponatremia secondary to SIADH: Stable. Continue salt tablets and fluid restriction. 3. Urinary tract infection: Treated with ciprofloxacin. 4. History of seizure secondary to hyponatremia: Stable. Continue Dilantin. 5. History of rheumatoid arthritis: Methotrexate discontinued. 6. Anemia: Stable. 7. Prominent widening of the predental space with posterior superior projection of the dens /cervical medullary compression: Family desires conservative management.  Treatment of underlying medical problems and disposition limited by psychosis. Consult psychiatry.  Code Status: Full code Family Communication: [name] can be reached at [facility] [phone] or [phone] (lost his cell phone), Work [phone]. Disposition Plan: Return to [facility] when stable.  Brendia Sacks, MD  [facility] Pager [phone]. If 8PM-8AM, please contact night-coverage at [email or web address], password [facility] 11/23/2011, 7:23 PM  LOS: 12 days   Brief narrative:   Consultants:    Procedures:  HPI/Subjective: No complaints. Hallucinations noted.  Objective: Filed Vitals:   11/21/11 2115 11/22/11 0606 11/22/11 2056 11/23/11 1300  BP: 138/77 162/87 175/79 129/68  Pulse: 86 77 87 79  Temp: 97.9 F (36.6 C) 98 F (36.7 C) 97.5 F (36.4 C) 98 F (36.7 C)  TempSrc: Oral Oral Oral   Resp: 20 16 16 17   Height:      Weight:      SpO2: 100% 100% 100% 99%    Intake/Output Summary (Last 24 hours) at 11/23/11 1923 Last data filed at 11/23/11 0250  Gross per 24 hour  Intake      0 ml  Output    120 ml  Net   -120 ml    Exam:   General:  Appears calm and comfortable. Keep sheet over the head. Reluctant to comply with examination but not hostile or combative.  Cardiovascular: Regular rate and  rhythm. No murmur, rub, gallop. No lower extremity edema.  Respiratory: Clear to auscultation bilaterally. No wheezes, rales, rhonchi. Normal respiratory effort  Data Reviewed: Basic Metabolic Panel:  Lab 11/22/11 2841 11/21/11 0700 11/20/11 0525 11/19/11 0448 11/18/11 1908  NA 127* 126* 128* 127* 127*  K 3.6 3.5 -- -- --  CL 92* 93* 97 93* 94*  CO2 27 28 28 27 27   GLUCOSE 98 94 87 154* 110*  BUN 9 9 11 12 12   CREATININE 0.42* 0.39* 0.42* 0.44* 0.49*  CALCIUM 8.9 8.6 8.3* 9.1 8.6  MG -- -- -- -- --  PHOS -- -- -- -- --   CBC:  Lab 11/22/11 0642  WBC 3.2*  NEUTROABS --  HGB 9.9*  HCT 28.3*  MCV 90.7  PLT 259   Studies: Ct Head Wo Contrast  10/24/2011  *RADIOLOGY REPORT*  Clinical Data:  Neck pain, fall, dizziness, history hypertension, rheumatoid arthritis  CT HEAD WITHOUT CONTRAST CT CERVICAL SPINE WITHOUT CONTRAST  IMPRESSION: Chronic retrolisthesis of C2 versus C1 with marked widening of the predental space to 10 mm AP and marked narrowing of the spinal canal to 5.3 mm AP diameter between the odontoid process and posterior arch C1. This is likely causing marked compression of the cervicomedullary junction but is not significantly changed since previous study of 07/27/2010. No new osseous findings identified.  Original Report Authenticated By: Lollie Marrow, M.D.   Mr Brain Wo Contrast  11/15/2011  *RADIOLOGY REPORT*  Clinical Data: Worsening confusion and lethargy.  Hyponatremia. Rheumatoid arthritis.  MRI HEAD WITHOUT CONTRAST  Technique:  Multiplanar, multiecho pulse sequences of the  brain and surrounding structures were obtained according to standard protocol without intravenous contrast.  Comparison: 10/25/2011.  Findings: Motion degraded exam.  Fast sequencing had to be utilized.  Prominent widening of the predental space with posterior superior projection of the dens which is causing severe compression of the cervical medullary junction.  Within the compressed cervical  medullary junction, slight increased signal suggesting gliosis and / or edema.  Upper cervical cord below this compression is atrophic.  No acute infarct.  Global atrophy, ventricular prominence probably related to atrophy rather hydrocephalus.  Mild small vessel disease type changes.  No intracranial hemorrhage.  No intracranial mass lesion detected on this unenhanced exam.  Major intracranial vascular structures are patent.  IMPRESSION: Severe cervical medullary compression as detailed above.  No acute infarct.  Global atrophy.  Mild small vessel disease type changes.  Critical Value/emergent results were called by telephone at the time of interpretation on 11/15/2011 and  at 2:45 p.m.  to  Dr. Betti Cruz, who verbally acknowledged these results.  Original Report Authenticated By: Fuller Canada, M.D.  Authenticated By: Fuller Canada, M.D.   Scheduled Meds:   . amLODipine  5 mg Oral Daily  . docusate sodium  100 mg Oral BID  . enoxaparin  40 mg Subcutaneous QHS  . folic acid  1 mg Oral Daily  . LORazepam  1 mg Intramuscular Once  . phenytoin  200 mg Oral QHS  . potassium chloride  20 mEq Oral Daily  . senna  1 tablet Oral BID  . sodium chloride  3 mL Intravenous Q12H  . sodium chloride  3 g Oral TID WC   Continuous Infusions:   Principal Problem:  *Psychosis Active Problems:  Hyponatremia  Hallucinations  Encephalopathy  Cervical subluxation  ANXIETY  HYPERTENSION  Rheumatoid arthritis  UTI (urinary tract infection), uncomplicated

## 2011-11-24 NOTE — Progress Notes (Signed)
Per MD order, PICC line removed. Cath intact at 35cm. Vaseline pressure gauze to site, pressure held x . No bleeding to site. Dana RN instructed to keep dressing CDI x 24 hours. Consuello Masse

## 2011-11-24 NOTE — Consult Note (Signed)
Patient Identification:  [name] Date of Evaluation:  11/24/2011  Reason for Consult: Pt with psychosis Referring Provider: Dr. Irene Harmon History of Present Illness:Pt resides at [name] and stopped taking her medications and became confused.   After admission she alternates between being lucid and then very confused and refusing medications  Past Psychiatric History:Unknown   Past Medical History:     Past Medical History  Diagnosis Date  . Hypertension   . Dysrhythmia   . Rheumatoid arthritis     With known cervical subluxation  . Fall 06/01/11    twice this year  . Incontinence of urine     wears depends  . 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 Psychoses 05/2011    Hallucinations and AMS in setting of low Na, UTI, and steroid use        Past Surgical History  Procedure Date  . Toe surgery     great toe bilateral feet due to arthrits    Allergies:  Allergies  Allergen Reactions  . Codeine Anxiety  . Colchicine Anxiety  . Famotidine Anxiety  . Penicillins Anxiety    Current Medications:  Prior to Admission medications   Medication Sig Start Date End Date Taking? Authorizing Provider  cholecalciferol (VITAMIN D) 1000 UNITS tablet Take 1,000 Units by mouth daily.     Yes Historical Provider, MD  ferrous fumarate (HEMOCYTE - 106 MG FE) 325 (106 FE) MG TABS Take 1 tablet by mouth daily.    Yes Historical Provider, MD  folic acid (FOLVITE) 1 MG tablet Take 1 mg by mouth daily.     Yes Historical Provider, MD  LORazepam (ATIVAN IJ) Inject 1 Syringe as directed once.   Yes Historical Provider, MD  phenytoin (DILANTIN) 200 MG ER capsule Take 1 capsule (200 mg total) by mouth at bedtime. 10/29/11 10/28/12 Yes Tina Pia, MD  senna-docusate (SENOKOT-S) 8.6-50 MG per tablet Take 1 tablet by mouth at bedtime. 06/20/11 06/19/12 Yes Tina I Elsaid, MD  sodium chloride 1 G tablet Take 1 tablet (1 g total) by mouth 2 (two) times daily with a meal. 10/28/11  10/27/12 Yes Tina Pia, MD  vitamin C (ASCORBIC ACID) 500 MG tablet Take 500 mg by mouth daily.     Yes Historical Provider, MD    Social History:    reports that she has never smoked. She has never used smokeless tobacco. She reports that she does not drink alcohol or use illicit drugs.   Family History:    History reviewed. No pertinent family history.  Mental Status Examination/Evaluation: Objective:  Appearance: marked arthritic distortions of phalanges and girp of hand/contractures  Psychomotor Activity:  Decreased and trying to eat her meal; manipulation of fork very difficult  Eye Contact::  Good  Speech:  soft  Volume:  Decreased  Mood:  Euthymic  Affect:  Congruent  Thought Process:  Auditory hallucinations explained as ruminations of past times she sat with realtive until they 'passed'  Orientation:  Full  Thought Content:  Auditory hallucinations  Suicidal Thoughts:  No  Homicidal Thoughts:  No  Judgement:  Fair  Insight:  Fair    DIAGNOSIS:   AXIS I   Delirium due to hyponatremia, anemia  AXIS II  Deferred  AXIS III See medical notes.  AXIS IV economic problems, housing problems and other psychosocial or environmental problems moved to [place] to be near her two sons  AXIS V 61-70 mild symptoms  Assessment/Plan: Evaluated ~ 2:30 pm 11/24/11, Pt is sitting up with pleasant smile and greeting.  She tries to eat her meal but arthritic deformities of fingers cause manipulation of fork difficult.  She has a slow rate of speech that is spontaneous and explains her auditory voices.  She explains that she has sat with several family members who 'passed' during her watch.  She says the voices she hears help her remember each one.  She explains her AH is a very logical manner.  They are not talking to her, more like reciting events.  During this visit, her thought process was very organized, on topic and logical  RECOMMENDATION:  1.  Consider mental status changes  related to hyponatremia and relative hypoxia from chronic anemia 2. She has a QT:QTc interval that is very close. - may alter choice of medications 3. Repetitive hyponatremia may require evaluation of behavior in the residence. R/O diabetes insipidus.  4. Collateral information, if pt agrees, from son[s] may shed light on her mental status in her living situation. 5. Consider Dilantin level 6. Will follow pt.  [name] J. Ferol Luz, MD Psychiatrist 11/24/2011 11:56 PM.

## 2011-11-24 NOTE — Progress Notes (Signed)
[facility] PROGRESS NOTE  [name] WUJ:[id number] DOB: 12-Mar-1930 DOA: 11/11/2011 PCP: Renato Gails, TIFFANY, DO, DO  Assessment/Plan: 1. Encephalopathy/delirium/psychosis: Very cooperative and pleasant today. Psychiatry consultation requested for assistance with psychosis and suggestions for outpatient management. 2. Hyponatremia secondary to SIADH: Stable. Treated in conjunction with nephrology, initially with salt tablets, fluid restriction and then with the demeclocycline. When this failed to correct she was treated with hypertonic saline with improvement. She was then transitioned to salt tablets 3 g by mouth 3 times a day as maintenance therapy in addition to fluid restriction. Lasix may also be added if needed but as her sodium has remained stable this was not recommended at this time. Continue salt tablets and fluid restriction. Normal TSH and serum cortisol in the past. 3. Urinary tract infection: Treated with ciprofloxacin. 4. History of seizure secondary to hyponatremia: Stable. Continue Dilantin. 5. History of rheumatoid arthritis: Methotrexate discontinued as it can be associated with SIADH. 6. Anemia: Stable. 7. Prominent widening of the predental space with posterior superior projection of the dens /cervical medullary compression: Dr. Gerlene Fee recommended conservative management. Family desires conservative management. 8. History of seizure secondary to hyponatremia: Continue Dilantin.  Overall appears much improved. If remains stable anticipate discharge May 31.  Spoke with son [name] by telephone and updated on above.  Code Status: Full code Family Communication: [name] can be reached at [facility] [phone] or [phone] (lost his cell phone), Work [phone]. Disposition Plan: Return to [facility] when stable.  Brendia Sacks, MD  [facility] Pager [phone]. If 8PM-8AM, please contact night-coverage at [email or web address], password [facility] 11/24/2011, 4:43 PM  LOS: 13  days   Brief narrative: 78 year old woman presented from [facility] with complaint of altered mental status for several days. To have hyponatremia.  Chart review:  10/24/2011 hospitalization: Hyponatremia  06/24/2011 hospitalization: Hyponatremia, psychosis.  01/2011 hospitalization: Severe symptomatic hyponatremia, seizure activity secondary to severe hyponatremia.  Consultants:  Nephrology:  Physical therapy: Skilled nursing facility  HPI/Subjective: No complaints. Feels well.  Objective: Filed Vitals:   11/23/11 1300 11/23/11 2120 11/24/11 0643 11/24/11 1449  BP: 129/68 123/65 128/66 108/64  Pulse: 79 80 74 76  Temp: 98 F (36.7 C) 98.1 F (36.7 C) 98.4 F (36.9 C) 98.4 F (36.9 C)  TempSrc:  Oral Oral Oral  Resp: 17 20 16 19   Height:      Weight:      SpO2: 99% 99% 99% 98%    Intake/Output Summary (Last 24 hours) at 11/24/11 1643 Last data filed at 11/24/11 0946  Gross per 24 hour  Intake    720 ml  Output      0 ml  Net    720 ml    Exam:   General:  Appears calm and comfortable. Eating lunch. Engaging. Makes good eye contact.   Cardiovascular: Regular rate and rhythm. No murmur, rub, gallop. No lower extremity edema.  Respiratory: Clear to auscultation bilaterally. No wheezes, rales, rhonchi. Normal respiratory effort  Psychiatric: Does not appear to respond to any hallucinations today. Speech is clear and mostly appropriate. Quite pleasant.  Data Reviewed: Basic Metabolic Panel:  Lab 11/22/11 1027 11/21/11 0700 11/20/11 0525 11/19/11 0448 11/18/11 1908  NA 127* 126* 128* 127* 127*  K 3.6 3.5 -- -- --  CL 92* 93* 97 93* 94*  CO2 27 28 28 27 27   GLUCOSE 98 94 87 154* 110*  BUN 9 9 11 12 12   CREATININE 0.42* 0.39* 0.42* 0.44* 0.49*  CALCIUM 8.9  8.6 8.3* 9.1 8.6  MG -- -- -- -- --  PHOS -- -- -- -- --   CBC:  Lab 11/22/11 0642  WBC 3.2*  NEUTROABS --  HGB 9.9*  HCT 28.3*  MCV 90.7  PLT 259   Studies: Ct Head Wo  Contrast  10/24/2011  *RADIOLOGY REPORT*  Clinical Data:  Neck pain, fall, dizziness, history hypertension, rheumatoid arthritis  CT HEAD WITHOUT CONTRAST CT CERVICAL SPINE WITHOUT CONTRAST  IMPRESSION: Chronic retrolisthesis of C2 versus C1 with marked widening of the predental space to 10 mm AP and marked narrowing of the spinal canal to 5.3 mm AP diameter between the odontoid process and posterior arch C1. This is likely causing marked compression of the cervicomedullary junction but is not significantly changed since previous study of 07/27/2010. No new osseous findings identified.  Original Report Authenticated By: Lollie Marrow, M.D.   Mr Brain Wo Contrast  11/15/2011  *RADIOLOGY REPORT*  Clinical Data: Worsening confusion and lethargy.  Hyponatremia. Rheumatoid arthritis.  MRI HEAD WITHOUT CONTRAST  Technique:  Multiplanar, multiecho pulse sequences of the brain and surrounding structures were obtained according to standard protocol without intravenous contrast.  Comparison: 10/25/2011.  Findings: Motion degraded exam.  Fast sequencing had to be utilized.  Prominent widening of the predental space with posterior superior projection of the dens which is causing severe compression of the cervical medullary junction.  Within the compressed cervical medullary junction, slight increased signal suggesting gliosis and / or edema.  Upper cervical cord below this compression is atrophic.  No acute infarct.  Global atrophy, ventricular prominence probably related to atrophy rather hydrocephalus.  Mild small vessel disease type changes.  No intracranial hemorrhage.  No intracranial mass lesion detected on this unenhanced exam.  Major intracranial vascular structures are patent.  IMPRESSION: Severe cervical medullary compression as detailed above.  No acute infarct.  Global atrophy.  Mild small vessel disease type changes.  Critical Value/emergent results were called by telephone at the time of interpretation on  11/15/2011 and  at 2:45 p.m.  to  Dr. Betti Cruz, who verbally acknowledged these results.  Original Report Authenticated By: Fuller Canada, M.D.  Authenticated By: Fuller Canada, M.D.   Scheduled Meds:    . amLODipine  5 mg Oral Daily  . docusate sodium  100 mg Oral BID  . enoxaparin  40 mg Subcutaneous QHS  . folic acid  1 mg Oral Daily  . LORazepam  1 mg Intramuscular Once  . phenytoin  200 mg Oral QHS  . potassium chloride  20 mEq Oral Daily  . senna  1 tablet Oral BID  . sodium chloride  3 mL Intravenous Q12H  . sodium chloride  3 g Oral TID WC   Continuous Infusions:   Principal Problem:  *Psychosis Active Problems:  Hyponatremia  Hallucinations  Encephalopathy  Cervical subluxation  ANXIETY  HYPERTENSION  Rheumatoid arthritis  UTI (urinary tract infection), uncomplicated

## 2011-11-24 NOTE — Progress Notes (Signed)
Brief Nutrition Note  Reason: LOS  Patient reported good appetite. She stated she eats what she can. PO intake documented varies at meals, mostly 75-100%. Patient reported difficulty chewing, but denies the need for softer foods. She reported she does not want to receive any foods from the hospital anymore.   RD available for nutrition needs.  Iven Finn [facility] [phone]

## 2011-11-24 NOTE — Progress Notes (Signed)
Pt appears more lucid throughout shift. Pt no longer combative and offered apology for behavior previous nights and stated she "loved" staff. Pt still exhibits inconsistent behavior complaining of a "wire" in her chicken noodle soup. Pt also does not want nursing staff in room at times and will hold her hand up to exhibit her wishes for nurses to remain in doorway and not enter room.  Earnest Conroy. Clelia Croft, RN

## 2011-11-25 DIAGNOSIS — R410 Disorientation, unspecified: Secondary | ICD-10-CM | POA: Diagnosis present

## 2011-11-25 DIAGNOSIS — D649 Anemia, unspecified: Secondary | ICD-10-CM

## 2011-11-25 DIAGNOSIS — E236 Other disorders of pituitary gland: Secondary | ICD-10-CM

## 2011-11-25 DIAGNOSIS — F22 Delusional disorders: Secondary | ICD-10-CM

## 2011-11-25 DIAGNOSIS — F05 Delirium due to known physiological condition: Secondary | ICD-10-CM

## 2011-11-25 DIAGNOSIS — E871 Hypo-osmolality and hyponatremia: Secondary | ICD-10-CM

## 2011-11-25 DIAGNOSIS — R197 Diarrhea, unspecified: Secondary | ICD-10-CM

## 2011-11-25 MED ORDER — SODIUM CHLORIDE 1 G PO TABS: 3.0000 g | ORAL_TABLET | Freq: Three times a day (TID) | ORAL | Status: AC

## 2011-11-25 MED ORDER — SODIUM CHLORIDE 1 G PO TABS: 3.0000 g | ORAL_TABLET | Freq: Three times a day (TID) | ORAL | Status: DC

## 2011-11-25 MED ORDER — LORAZEPAM 0.5 MG PO TABS: 0.5000 mg | ORAL_TABLET | Freq: Three times a day (TID) | ORAL | Status: AC | PRN

## 2011-11-25 NOTE — Discharge Summary (Signed)
hysician Discharge Summary  [name]:[id number] DOB: [birth year] DOA: 11/11/2011  PCP: Tina Spikes, DO, DO  Admit date: 11/11/2011 Discharge date: 11/25/2011  Recommendations for Outpatient Follow-up:  1. Followup hyponatremia--consider repeat basic metabolic panel in one week. 2. Followup delirium/hallucinations--thought to be multifactorial in nature, related to hyponatremia and chronic illness.  3. At this point the family desires no operative intervention for known cervical medullary compression.   Follow-up Information    Follow up with REED, TIFFANY, DO in 1 week.   Contact information:   [address] [place] [address] [phone]         Discharge Diagnoses:  1. Encephalopathy/delirium, stable 2. Hyponatremia secondary to SIADH, stable 3. UTI, treated 4. History of seizure disorder, stable 5. History of recurrent arthritis 6. Stable anemia 7. Prominent widening of the predental space with posterior superior projection of the dens/cervical medullary compression  Discharge Condition: Improved Disposition: Return to skilled nursing facility  Diet recommendation: Regular  History of present illness:  78 year old woman presented from [facility] with complaint of altered mental status for several days. Found to have hyponatremia.  Hospital Course:  [name] was admitted medical floor. Because of her persistent hyponatremia she was seen in consultation with nephrology. After various treatments her sodium level did stabilize an acceptable level and nephrology signed off. See below for further details. Urinary tract infection was successfully treated with antibiotics. The patient has a history of delirium when she has been hospitalized in the past with hyponatremia and this was again present during this hospitalization. She was seen by psychiatry and felt to be stable for discharge. Hopefully she will continue to improve in her regular  environment. These and other issues delineated below. 1. Encephalopathy/delirium: Cooperative and pleasant today although has periods where she is less cooperative. Psychiatry consultation appreciated.  Discussed with [name] by telephone today--she feels that symptoms are more consistent with delirium than psychosis. She recommends Haldol as needed, but no maintenance medication. She concurs with discharge to skilled facility today.  2. Hyponatremia secondary to SIADH: Stable. Treated in conjunction with nephrology, initially with salt tablets, fluid restriction and then with the demeclocycline. When this failed to correct she was treated with hypertonic saline with improvement. She was then transitioned to salt tablets 3 g by mouth 3 times a day as maintenance therapy in addition to fluid restriction. Lasix may also be added if needed but as her sodium has remained stable this was not recommended at this time. Continue salt tablets and fluid restriction. Normal TSH and serum cortisol in the past.  3. Urinary tract infection: Treated with ciprofloxacin.  4. History of seizure secondary to hyponatremia: Stable. Continue Dilantin.  5. History of rheumatoid arthritis: Methotrexate discontinued as it can be associated with SIADH.  6. Anemia: Stable.  7. Prominent widening of the predental space with posterior superior projection of the dens /cervical medullary compression: [name] recommended conservative management. Family desires conservative management.  8. History of seizure secondary to hyponatremia: Continue Dilantin.  Medical issues remain stable and hallucinations remain stable. Felt to be stable for discharge by psychiatry. It is felt that she will likely improve in her home environment. Risk for rehospitalization is high based on chronic illness, but patient appears stable and to have received maximum benefit from this hospitalization.  Consultants:  Nephrology:   Physical therapy:  Skilled nursing facility  Psychiatry  Discharge Instructions  Discharge Orders    Future Orders Please Complete By Expires  Diet general      Increase activity slowly      Discharge instructions      Comments:   Followup with primary care physician as instructed. Fluid restriction 1.2 L per day.     Medication List  As of 11/25/2011  1:29 PM   STOP taking these medications         ATIVAN IJ         TAKE these medications         cholecalciferol 1000 UNITS tablet   Commonly known as: VITAMIN D   Take 1,000 Units by mouth daily.      ferrous fumarate 325 (106 FE) MG Tabs   Commonly known as: HEMOCYTE - 106 mg FE   Take 1 tablet by mouth daily.      folic acid 1 MG tablet   Commonly known as: FOLVITE   Take 1 mg by mouth daily.      LORazepam 0.5 MG tablet   Commonly known as: ATIVAN   Take 1 tablet (0.5 mg total) by mouth every 8 (eight) hours as needed for anxiety.      phenytoin 200 MG ER capsule   Commonly known as: DILANTIN   Take 1 capsule (200 mg total) by mouth at bedtime.      senna-docusate 8.6-50 MG per tablet   Commonly known as: Senokot-S   Take 1 tablet by mouth at bedtime.      sodium chloride 1 G tablet   Take 3 tablets (3 g total) by mouth 3 (three) times daily with meals.      vitamin C 500 MG tablet   Commonly known as: ASCORBIC ACID   Take 500 mg by mouth daily.           The results of significant diagnostics from this hospitalization (including imaging, microbiology, ancillary and laboratory) are listed below for reference.    Significant Diagnostic Studies: [name] Contrast  11/15/2011  *RADIOLOGY REPORT*  Clinical Data: Worsening confusion and lethargy.  Hyponatremia. Rheumatoid arthritis.  MRI HEAD WITHOUT CONTRAST  Technique:  Multiplanar, multiecho pulse sequences of the brain and surrounding structures were obtained according to standard protocol without intravenous contrast.  Comparison: 10/25/2011.  Findings: Motion  degraded exam.  Fast sequencing had to be utilized.  Prominent widening of the predental space with posterior superior projection of the dens which is causing severe compression of the cervical medullary junction.  Within the compressed cervical medullary junction, slight increased signal suggesting gliosis and / or edema.  Upper cervical cord below this compression is atrophic.  No acute infarct.  Global atrophy, ventricular prominence probably related to atrophy rather hydrocephalus.  Mild small vessel disease type changes.  No intracranial hemorrhage.  No intracranial mass lesion detected on this unenhanced exam.  Major intracranial vascular structures are patent.  IMPRESSION: Severe cervical medullary compression as detailed above.  No acute infarct.  Global atrophy.  Mild small vessel disease type changes.  Critical Value/emergent results were called by telephone at the time of interpretation on 11/15/2011 and  at 2:45 p.m.  to  Dr. Betti Cruz, who verbally acknowledged these results.  Original Report Authenticated By: Fuller Canada, M.D.   Dg Chest Port 1 View  11/11/2011  *RADIOLOGY REPORT*  Clinical Data: Altered mental status  PORTABLE CHEST - 1 VIEW  Comparison: 06/14/2011  Findings: High-riding right greater than left humeral head could signify rotator cuff tendinopathy or could be positional.  Heart size is normal.  Cardiac  leads obscure detail. Aorta is ectatic and unfolded.  No focal pulmonary opacity.  No pleural effusion. Right- sided skin fold is noted but no pneumothorax is seen.  IMPRESSION: No acute cardiopulmonary process.  Original Report Authenticated By: Harrel Lemon, M.D.   Labs: Basic Metabolic Panel:  Lab 11/22/11 0981 11/21/11 0700 11/20/11 0525 11/19/11 0448 11/18/11 1908  NA 127* 126* 128* 127* 127*  K 3.6 3.5 -- -- --  CL 92* 93* 97 93* 94*  CO2 27 28 28 27 27   GLUCOSE 98 94 87 154* 110*  BUN 9 9 11 12 12   CREATININE 0.42* 0.39* 0.42* 0.44* 0.49*  CALCIUM 8.9 8.6 8.3*  9.1 8.6  MG -- -- -- -- --  PHOS -- -- -- -- --   CBC:  Lab 11/22/11 0642  WBC 3.2*  NEUTROABS --  HGB 9.9*  HCT 28.3*  MCV 90.7  PLT 259    Principal Problem:  *Acute delirium Active Problems:  Hyponatremia  Hallucinations  Encephalopathy  Cervical subluxation  ANXIETY  HYPERTENSION  Rheumatoid arthritis  UTI (urinary tract infection), uncomplicated   Time coordinating discharge: 35 minutes.  Signed:  Brendia Sacks, MD [facility] 11/25/2011, 1:29 PM

## 2011-11-25 NOTE — Progress Notes (Signed)
Pt is seen sitting up in bed.  She is calm and social  Patient Identification:  [name] Date of Evaluation:  11/25/2011 Reason for Consult:  Psychosis  Referring Provider: Dr. Irene Limbo History of Present Illness: pt became confused at her residence and began refusing medications.  After admission, she again had a period of refusing medication.  She admits to [facility].    Mental Status Examination/Evaluation: Objective:  Appearance: Casual and hands deformed due to rheumatoid arthritis  Psychomotor Activity:  Normal  Eye Contact::  Good  Speech:  Clear and Coherent and soft  Volume:  Decreased  Mood:  Euthymic  Affect:  Congruent  Thought Process:  Relevant  Orientation:  Full  Thought Content:  Delusions  Suicidal Thoughts:  No  Homicidal Thoughts:  No  Judgement:  Good  Insight:  Present    DIAGNOSIS:   AXIS I  Delirium due to hyponatremia, resolved; occasional delusionsrrr  AXIS II  Deferred  AXIS III See medical notes.  AXIS IV other psychosocial or environmental problems and loss of independent care; loss of loved ones  AXIS V 61-70 mild symptoms     Assessment/Plan:  Discussed with Dr. Irene Limbo and CSW Bethany  Pt seen ~2:30 pm 11/25/11 Pt is sitting up in bed.  She is alert, oriented and very observant.  She indicates she is ready to return to her residence; questions how she will get there.  She says she has auditory hallucinations and is aware of them She makes only one delusional statement.  She says she say the operation she had after she fell and hit her head where "they packed rice in the head" on TV just like they did to my head".  She insists rice in there and she is gradually combing it out.  She has no other demonstrable psychotic or bizarre symptoms.  She is oriented and anticipates leaving the hospital and seeing her son after he leaves work.   RECOMMENDATION:  1.  Pt has capacity and is ready to return to a residence.  2. No further medications suggested  for early mild dementia 3. No further psychiatric needs.  MD  Psychiatrist signs off.  Tina Trudell J. Ferol Luz, MD Psychiatrist  11/26/2011 12:00 AM         Brindley Madarang 11/25/2011, 11:43 PM

## 2011-11-25 NOTE — Progress Notes (Signed)
Patient is cleared for discharge. Packet copied and placed in [place]. ptar called for transportation. CSW called patients son and informed of discharge. He is agreeable.  [place] Norberto C. Glori Machnik MSW, LCSW [phone]

## 2011-11-25 NOTE — Progress Notes (Signed)
[facility] PROGRESS NOTE  [name] ZOX:[id number] DOB: 08/13/1929 DOA: 11/11/2011 PCP: Renato Gails, TIFFANY, DO, DO  Assessment/Plan: 1. Encephalopathy/delirium/psychosis: Cooperative and pleasant today although has periods where she is less cooperative. Psychiatry consultation appreciated.  Discussed with Dr. Ferol Luz by telephone today. She recommends Haldol as needed, but no maintenance medication. She concurs with discharge to skilled facility today. 2. Hyponatremia secondary to SIADH: Stable. Treated in conjunction with nephrology, initially with salt tablets, fluid restriction and then with the demeclocycline. When this failed to correct she was treated with hypertonic saline with improvement. She was then transitioned to salt tablets 3 g by mouth 3 times a day as maintenance therapy in addition to fluid restriction. Lasix may also be added if needed but as her sodium has remained stable this was not recommended at this time. Continue salt tablets and fluid restriction. Normal TSH and serum cortisol in the past. 3. Urinary tract infection: Treated with ciprofloxacin. 4. History of seizure secondary to hyponatremia: Stable. Continue Dilantin. 5. History of rheumatoid arthritis: Methotrexate discontinued as it can be associated with SIADH. 6. Anemia: Stable. 7. Prominent widening of the predental space with posterior superior projection of the dens /cervical medullary compression: Dr. Gerlene Fee recommended conservative management. Family desires conservative management. 8. History of seizure secondary to hyponatremia: Continue Dilantin.  Medical issues remained stable and hallucinations remain stable. Felt to be stable for discharge by psychiatry. It is felt that she will likely improve in her home environment. Risk for rehospitalization is high based on chronic illness, but patient appears stable and to have received maximum benefit from this hospitalization.  Code Status: Full code Family  Communication: [name] can be reached at [facility] [phone] or [phone] (lost his cell phone), Work [phone]. Disposition Plan: Return to [facility] when stable.  Brendia Sacks, MD  [facility] Pager [phone]. If 8PM-8AM, please contact night-coverage at [email or web address], password [facility] 11/25/2011, 1:11 PM  LOS: 14 days   Brief narrative: 78 year old woman presented from [facility] with complaint of altered mental status for several days. To have hyponatremia.  Chart review:  10/24/2011 hospitalization: Hyponatremia  06/24/2011 hospitalization: Hyponatremia, psychosis.  01/2011 hospitalization: Severe symptomatic hyponatremia, seizure activity secondary to severe hyponatremia.  Consultants:  Nephrology:  Physical therapy: Skilled nursing facility  HPI/Subjective: No complaints. Feels well.  Objective: Filed Vitals:   11/24/11 1449 11/24/11 2045 11/25/11 0505 11/25/11 1015  BP: 108/64 107/61 135/82 102/63  Pulse: 76 79 72   Temp: 98.4 F (36.9 C) 98.3 F (36.8 C) 98 F (36.7 C)   TempSrc: Oral Oral Oral   Resp: 19 18 18    Height:      Weight:      SpO2: 98% 100% 98%     Intake/Output Summary (Last 24 hours) at 11/25/11 1311 Last data filed at 11/25/11 1029  Gross per 24 hour  Intake      0 ml  Output    825 ml  Net   -825 ml    Exam:   General:  Appears calm and comfortable.   Cardiovascular: Regular rate and rhythm. No murmur, rub, gallop.   Respiratory: Clear to auscultation bilaterally. No wheezes, rales, rhonchi. Normal respiratory effort  Psychiatric: Speech is clear.  Data Reviewed: Basic Metabolic Panel:  Lab 11/22/11 2841 11/21/11 0700 11/20/11 0525 11/19/11 0448 11/18/11 1908  NA 127* 126* 128* 127* 127*  K 3.6 3.5 -- -- --  CL 92* 93* 97 93* 94*  CO2 27 28 28 27  27  GLUCOSE 98 94 87 154* 110*  BUN 9 9 11 12 12   CREATININE 0.42* 0.39* 0.42* 0.44* 0.49*  CALCIUM 8.9 8.6 8.3* 9.1 8.6  MG -- -- -- -- --  PHOS -- -- --  -- --   CBC:  Lab 11/22/11 0642  WBC 3.2*  NEUTROABS --  HGB 9.9*  HCT 28.3*  MCV 90.7  PLT 259   Studies: Ct Head Wo Contrast  10/24/2011  *RADIOLOGY REPORT*  Clinical Data:  Neck pain, fall, dizziness, history hypertension, rheumatoid arthritis  CT HEAD WITHOUT CONTRAST CT CERVICAL SPINE WITHOUT CONTRAST  IMPRESSION: Chronic retrolisthesis of C2 versus C1 with marked widening of the predental space to 10 mm AP and marked narrowing of the spinal canal to 5.3 mm AP diameter between the odontoid process and posterior arch C1. This is likely causing marked compression of the cervicomedullary junction but is not significantly changed since previous study of 07/27/2010. No new osseous findings identified.  Original Report Authenticated By: Lollie Marrow, M.D.   Mr Brain Wo Contrast  11/15/2011  *RADIOLOGY REPORT*  Clinical Data: Worsening confusion and lethargy.  Hyponatremia. Rheumatoid arthritis.  MRI HEAD WITHOUT CONTRAST  Technique:  Multiplanar, multiecho pulse sequences of the brain and surrounding structures were obtained according to standard protocol without intravenous contrast.  Comparison: 10/25/2011.  Findings: Motion degraded exam.  Fast sequencing had to be utilized.  Prominent widening of the predental space with posterior superior projection of the dens which is causing severe compression of the cervical medullary junction.  Within the compressed cervical medullary junction, slight increased signal suggesting gliosis and / or edema.  Upper cervical cord below this compression is atrophic.  No acute infarct.  Global atrophy, ventricular prominence probably related to atrophy rather hydrocephalus.  Mild small vessel disease type changes.  No intracranial hemorrhage.  No intracranial mass lesion detected on this unenhanced exam.  Major intracranial vascular structures are patent.  IMPRESSION: Severe cervical medullary compression as detailed above.  No acute infarct.  Global atrophy.  Mild  small vessel disease type changes.  Critical Value/emergent results were called by telephone at the time of interpretation on 11/15/2011 and  at 2:45 p.m.  to  Dr. Betti Cruz, who verbally acknowledged these results.  Original Report Authenticated By: Fuller Canada, M.D.  Authenticated By: Fuller Canada, M.D.   Scheduled Meds:    . amLODipine  5 mg Oral Daily  . docusate sodium  100 mg Oral BID  . enoxaparin  40 mg Subcutaneous QHS  . folic acid  1 mg Oral Daily  . LORazepam  1 mg Intramuscular Once  . phenytoin  200 mg Oral QHS  . potassium chloride  20 mEq Oral Daily  . senna  1 tablet Oral BID  . sodium chloride  3 mL Intravenous Q12H  . sodium chloride  3 g Oral TID WC   Continuous Infusions:   Principal Problem:  *Psychosis Active Problems:  Hyponatremia  Hallucinations  Encephalopathy  Cervical subluxation  ANXIETY  HYPERTENSION  Rheumatoid arthritis  UTI (urinary tract infection), uncomplicated

## 2011-11-25 NOTE — Progress Notes (Signed)
PT/OT/ST Cancellation Note  ___Treatment cancelled today due to medical issues with patient which prohibited therapy  ___ Treatment cancelled today due to patient receiving procedure or test   __x_ Treatment cancelled today due to patient's refusal to participate despite max encouragement from therapist. Pt insisting on seeing rheumatologist. Also pt stating her doctor told her not to follow anyone else's orders but hers (after therapist attempted to explain hospitalists/doctors want therapy to work with pt during stay).  ___ Treatment cancelled today due to   Signature:  Rebeca Alert, PT [phone]

## 2011-11-25 NOTE — Progress Notes (Signed)
Error

## 2011-11-26 ENCOUNTER — Encounter (HOSPITAL_COMMUNITY): Payer: Self-pay | Admitting: *Deleted

## 2011-11-26 ENCOUNTER — Emergency Department (HOSPITAL_COMMUNITY)
Admission: EM | Admit: 2011-11-26 | Discharge: 2011-11-26 | Disposition: A | Discharge status: Home / Self Care | Payer: Medicare Other | Attending: Emergency Medicine | Admitting: Emergency Medicine

## 2011-11-26 DIAGNOSIS — Z79899 Other long term (current) drug therapy: Secondary | ICD-10-CM | POA: Insufficient documentation

## 2011-11-26 DIAGNOSIS — E871 Hypo-osmolality and hyponatremia: Secondary | ICD-10-CM | POA: Insufficient documentation

## 2011-11-26 DIAGNOSIS — M069 Rheumatoid arthritis, unspecified: Secondary | ICD-10-CM | POA: Insufficient documentation

## 2011-11-26 DIAGNOSIS — I10 Essential (primary) hypertension: Secondary | ICD-10-CM | POA: Insufficient documentation

## 2011-11-26 DIAGNOSIS — Z8673 Personal history of transient ischemic attack (TIA), and cerebral infarction without residual deficits: Secondary | ICD-10-CM | POA: Insufficient documentation

## 2011-11-26 HISTORY — DX: Cerebral infarction, unspecified: I63.9

## 2011-11-26 LAB — POCT I-STAT, CHEM 8
Chloride: 92 mEq/L — ABNORMAL LOW (ref 96–112)
Creatinine, Ser: 0.5 mg/dL (ref 0.50–1.10)
Glucose, Bld: 94 mg/dL (ref 70–99)
HCT: 30 % — ABNORMAL LOW (ref 36.0–46.0)
Hemoglobin: 10.2 g/dL — ABNORMAL LOW (ref 12.0–15.0)
Potassium: 3.7 mEq/L (ref 3.5–5.1)
Sodium: 128 mEq/L — ABNORMAL LOW (ref 135–145)

## 2011-11-26 LAB — DIFFERENTIAL
Lymphocytes Relative: 28 % (ref 12–46)
Lymphs Abs: 0.9 10*3/uL (ref 0.7–4.0)
Monocytes Absolute: 0.4 10*3/uL (ref 0.1–1.0)
Monocytes Relative: 11 % (ref 3–12)
Neutro Abs: 1.9 10*3/uL (ref 1.7–7.7)
Neutrophils Relative %: 59 % (ref 43–77)

## 2011-11-26 LAB — CBC
HCT: 28.7 % — ABNORMAL LOW (ref 36.0–46.0)
Hemoglobin: 10 g/dL — ABNORMAL LOW (ref 12.0–15.0)
RBC: 3.18 MIL/uL — ABNORMAL LOW (ref 3.87–5.11)
WBC: 3.2 10*3/uL — ABNORMAL LOW (ref 4.0–10.5)

## 2011-11-26 LAB — URINALYSIS, ROUTINE W REFLEX MICROSCOPIC
Bilirubin Urine: NEGATIVE
Hgb urine dipstick: NEGATIVE
Nitrite: NEGATIVE
Specific Gravity, Urine: 1.013 (ref 1.005–1.030)
pH: 8 (ref 5.0–8.0)

## 2011-11-26 MED ORDER — SODIUM CHLORIDE 0.9 % IV BOLUS (SEPSIS)
1000.0000 mL | Freq: Once | INTRAVENOUS | Status: AC
  Administered 2011-11-26: 1000 mL via INTRAVENOUS

## 2011-11-26 MED ORDER — SODIUM CHLORIDE 0.9 % IV SOLN
INTRAVENOUS | Status: DC
  Administered 2011-11-26: 10:00:00 via INTRAVENOUS

## 2011-11-26 NOTE — ED Notes (Signed)
Staff found pt. Having seizure activity, unresponsive, it not grand mal; more less shaking/twitching of arms and legs. No oral trauma. laste 2-3 minutes; back to normal upon ems arrival.

## 2011-11-26 NOTE — ED Notes (Signed)
Family at bedside. 

## 2011-11-26 NOTE — ED Notes (Signed)
Pt. From [facility].

## 2011-11-26 NOTE — ED Notes (Signed)
Patient waiting for PTAR for transport to [facility]. Patient resting with NAD at this time.

## 2011-11-26 NOTE — ED Provider Notes (Signed)
History     CSN: [id number]  Arrival date & time 11/26/11  1191   First MD Initiated Contact with Patient 11/26/11 [phone]    The pt is a 64 y female who has severe debility from arthritis and prior stroke.  She "was shaking" today.  NH staff thought she was having  A sz which she has had in the past when she had hyponatremia.   Therefore, they sent her here for evaluation.   She denies biting her tongue or incontinence.  She denies pain anywhere.  She denies recent illness.   Presently she is alert and oriented in no distress.    Chief Complaint  Patient presents with  . Shaking    (Consider location/radiation/quality/duration/timing/severity/associated sxs/prior treatment) The history is provided by the patient and medical records.    Past Medical History  Diagnosis Date  . Hypertension   . Dysrhythmia   . Rheumatoid arthritis     With known cervical subluxation  . Fall 06/01/11    twice this year  . Incontinence of urine     wears depends  . 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 Psychoses 05/2011    Hallucinations and AMS in setting of low Na, UTI, and steroid use   . Stroke     Past Surgical History  Procedure Date  . Toe surgery     great toe bilateral feet due to arthrits    No family history on file.  History  Substance Use Topics  . Smoking status: Never Smoker   . Smokeless tobacco: Never Used  . Alcohol Use: No    OB History    Grav Para Term Preterm Abortions TAB SAB Ect Mult Living                  Review of Systems  Constitutional: Positive for chills. Negative for fever.  Eyes: Negative for visual disturbance.  Respiratory: Negative for cough and shortness of breath.   Cardiovascular: Negative for chest pain.  Gastrointestinal: Negative for nausea, vomiting and abdominal pain.  Genitourinary: Negative for dysuria.  Musculoskeletal: Negative for back pain.  Neurological: Positive for tremors. Negative for headaches.       "siezure"  . In my opinion, there is no definite evidence that a sz truly occurred.   Psychiatric/Behavioral: Negative for confusion.  All other systems reviewed and are negative.    Allergies  Codeine; Colchicine; Famotidine; and Penicillins  Home Medications   Current Outpatient Rx  Name Route Sig Dispense Refill  . TUBERCULIN PPD 5 UNIT/0.1ML ID SOLN Intradermal Inject 0.1 mLs into the skin every evening. Give for 3 days start 5-32-13 end date 11-27-11    . VITAMIN D 1000 UNITS PO TABS Oral Take 1,000 Units by mouth daily.      Marland Kitchen FERROUS FUMARATE 325 (106 FE) MG PO TABS Oral Take 1 tablet by mouth daily.     Marland Kitchen FOLIC ACID 1 MG PO TABS Oral Take 1 mg by mouth daily.      Marland Kitchen LORAZEPAM 0.5 MG PO TABS Oral Take 1 tablet (0.5 mg total) by mouth every 8 (eight) hours as needed for anxiety. 20 tablet 0  . PHENYTOIN SODIUM EXTENDED 200 MG PO CAPS Oral Take 1 capsule (200 mg total) by mouth at bedtime. 20 capsule 0  . SENNOSIDES-DOCUSATE SODIUM 8.6-50 MG PO TABS Oral Take 1 tablet by mouth at bedtime. 30 tablet 0  . SODIUM CHLORIDE 1 G PO TABS Oral  Take 3 tablets (3 g total) by mouth 3 (three) times daily with meals. 270 tablet 0  . VITAMIN C 500 MG PO TABS Oral Take 500 mg by mouth daily.        BP 139/76  Pulse 74  Temp(Src) 98.3 F (36.8 C) (Oral)  Resp 12  SpO2 100%  Physical Exam  Nursing note and vitals reviewed. Constitutional: She is oriented to person, place, and time. No distress.       Frail elderly female.  No distress.  HENT:  Head: Normocephalic and atraumatic.  Mouth/Throat: Oropharynx is clear and moist.       No tongue lesion  Eyes: Conjunctivae are normal.  Neck: Normal range of motion.  Cardiovascular: Normal rate.   No murmur heard. Pulmonary/Chest: Effort normal. No respiratory distress. She has no rales.  Abdominal: Soft. There is no tenderness.  Musculoskeletal: She exhibits no edema.       Severe arthritic changes in bilateral hands  Neurological: She is alert and  oriented to person, place, and time. No cranial nerve deficit.  Skin: Skin is warm and dry.  Psychiatric: She has a normal mood and affect. Judgment and thought content normal.    ED Course  Procedures (including critical care time)   Labs Reviewed  CBC  DIFFERENTIAL  URINALYSIS, ROUTINE W REFLEX MICROSCOPIC   No results found.   No diagnosis found.   Date: 11/26/2011  Rate: 74  Rhythm: normal sinus rhythm  QRS Axis: normal  Intervals: normal  ST/T Wave abnormalities: normal  Conduction Disutrbances: none  Narrative Interpretation: unremarkable     MDM  hyponatremia        Cheri Guppy, MD 11/26/11 1155

## 2011-11-26 NOTE — Progress Notes (Signed)
I escorted the pt's son to the correct room. Pt's son asked if I could help him find out information about his mother.  I informed the charge nurse about his request then went back to speak to the pt and her son.  Pt is very pleasant but confused.  Her son is very concerned about her condition and wants to make sure she is receiving the care she needs.  I offered emotional support and assurance.  Please page me if further assistance is needed. [employer]  [phone]

## 2011-11-26 NOTE — Discharge Instructions (Signed)
Your sodium was slightly low. We have replaced it in the emergency department.  FOllow up with your doctor next week. Return for worse or uncontrolled symptoms.

## 2011-11-30 ENCOUNTER — Emergency Department (HOSPITAL_COMMUNITY): Payer: Medicare Other

## 2011-11-30 ENCOUNTER — Emergency Department (HOSPITAL_COMMUNITY)
Admission: EM | Admit: 2011-11-30 | Discharge: 2011-12-01 | Disposition: A | Discharge status: Home / Self Care | Payer: Medicare Other | Attending: Emergency Medicine | Admitting: Emergency Medicine

## 2011-11-30 ENCOUNTER — Encounter (HOSPITAL_COMMUNITY): Payer: Self-pay | Admitting: Emergency Medicine

## 2011-11-30 DIAGNOSIS — G319 Degenerative disease of nervous system, unspecified: Secondary | ICD-10-CM | POA: Insufficient documentation

## 2011-11-30 DIAGNOSIS — R251 Tremor, unspecified: Secondary | ICD-10-CM

## 2011-11-30 DIAGNOSIS — I10 Essential (primary) hypertension: Secondary | ICD-10-CM | POA: Insufficient documentation

## 2011-11-30 DIAGNOSIS — R259 Unspecified abnormal involuntary movements: Secondary | ICD-10-CM | POA: Insufficient documentation

## 2011-11-30 DIAGNOSIS — E871 Hypo-osmolality and hyponatremia: Secondary | ICD-10-CM

## 2011-11-30 DIAGNOSIS — M069 Rheumatoid arthritis, unspecified: Secondary | ICD-10-CM | POA: Insufficient documentation

## 2011-11-30 DIAGNOSIS — Z8673 Personal history of transient ischemic attack (TIA), and cerebral infarction without residual deficits: Secondary | ICD-10-CM | POA: Insufficient documentation

## 2011-11-30 DIAGNOSIS — R5383 Other fatigue: Secondary | ICD-10-CM | POA: Insufficient documentation

## 2011-11-30 DIAGNOSIS — R5381 Other malaise: Secondary | ICD-10-CM | POA: Insufficient documentation

## 2011-11-30 DIAGNOSIS — F29 Unspecified psychosis not due to a substance or known physiological condition: Secondary | ICD-10-CM | POA: Insufficient documentation

## 2011-11-30 DIAGNOSIS — Z79899 Other long term (current) drug therapy: Secondary | ICD-10-CM | POA: Insufficient documentation

## 2011-11-30 HISTORY — DX: Encephalopathy, unspecified: G93.40

## 2011-11-30 HISTORY — DX: Generalized anxiety disorder: F41.1

## 2011-11-30 HISTORY — DX: Unspecified convulsions: R56.9

## 2011-11-30 HISTORY — DX: Anemia, unspecified: D64.9

## 2011-11-30 LAB — CBC
HCT: 29.8 % — ABNORMAL LOW (ref 36.0–46.0)
Hemoglobin: 10.3 g/dL — ABNORMAL LOW (ref 12.0–15.0)
MCHC: 34.6 g/dL (ref 30.0–36.0)
RBC: 3.24 MIL/uL — ABNORMAL LOW (ref 3.87–5.11)

## 2011-11-30 LAB — POCT I-STAT, CHEM 8
BUN: 10 mg/dL (ref 6–23)
Chloride: 98 mEq/L (ref 96–112)
Creatinine, Ser: 0.6 mg/dL (ref 0.50–1.10)
Potassium: 3.9 mEq/L (ref 3.5–5.1)
Sodium: 130 mEq/L — ABNORMAL LOW (ref 135–145)
TCO2: 20 mmol/L (ref 0–100)

## 2011-11-30 NOTE — ED Notes (Signed)
EMS reports nursing staff reports patient had seizure like activity that started 30 minutes ago. EMS reports no seizure like activity on transport.

## 2011-11-30 NOTE — ED Notes (Signed)
[id number]<AV> Expected date:11/30/11<BR> Expected time:<BR> Means of arrival:<BR> Comments:<BR> EMS 231 GC - seizure

## 2011-12-01 ENCOUNTER — Encounter (HOSPITAL_COMMUNITY): Payer: Self-pay | Admitting: Emergency Medicine

## 2011-12-01 LAB — URINALYSIS, ROUTINE W REFLEX MICROSCOPIC
Glucose, UA: NEGATIVE mg/dL
Hgb urine dipstick: NEGATIVE
Leukocytes, UA: NEGATIVE
Protein, ur: NEGATIVE mg/dL
pH: 6.5 (ref 5.0–8.0)

## 2011-12-01 LAB — PHENYTOIN LEVEL, TOTAL: Phenytoin Lvl: 3.3 ug/mL — ABNORMAL LOW (ref 10.0–20.0)

## 2011-12-01 MED ORDER — SODIUM CHLORIDE 0.9 % IV SOLN
1000.0000 mg | Freq: Once | INTRAVENOUS | Status: AC
  Administered 2011-12-01: 1000 mg via INTRAVENOUS
  Filled 2011-12-01: qty 20

## 2011-12-01 NOTE — ED Notes (Signed)
Patient discharge via [facility]. Respirations equal and unlabored. Skin warm and dry. No acute distress noted. 

## 2011-12-01 NOTE — Discharge Instructions (Signed)
Hyponatremia  °Hyponatremia is when the amount of salt (sodium) in your blood is too low. When sodium levels are low, your cells will absorb extra water and swell. The swelling happens throughout the body, but it mostly affects the brain. Severe brain swelling (cerebral edema), seizures, or coma can happen.  °CAUSES  °· Heart, kidney, or liver problems. °· Thyroid problems. °· Adrenal gland problems. °· Severe vomiting and diarrhea. °· Certain medicines or illegal drugs. °· Dehydration. °· Drinking too much water. °· Low-sodium diet. °SYMPTOMS  °· Nausea and vomiting. °· Confusion. °· Lethargy. °· Agitation. °· Headache. °· Twitching or shaking (seizures). °· Unconsciousness. °· Appetite loss. °· Muscle weakness and cramping. °DIAGNOSIS  °Hyponatremia is identified by a simple blood test. Your caregiver will perform a history and physical exam to try to find the cause and type of hyponatremia. Other tests may be needed to measure the amount of sodium in your blood and urine. °TREATMENT  °Treatment will depend on the cause.  °· Fluids may be given through the vein (IV). °· Medicines may be used to correct the sodium imbalance. If medicines are causing the problem, they will need to be adjusted. °· Water or fluid intake may be restricted to restore proper balance. °The speed of correcting the sodium problem is very important. If the problem is corrected too fast, nerve damage (sometimes unchangeable) can happen. °HOME CARE INSTRUCTIONS  °· Only take medicines as directed by your caregiver. Many medicines can make hyponatremia worse. Discuss all your medicines with your caregiver. °· Carefully follow any recommended diet, including any fluid restrictions. °· You may be asked to repeat lab tests. Follow these directions. °· Avoid alcohol and recreational drugs. °SEEK MEDICAL CARE IF:  °· You develop worsening nausea, fatigue, headache, confusion, or weakness. °· Your original hyponatremia symptoms return. °· You have  problems following the recommended diet. °SEEK IMMEDIATE MEDICAL CARE IF:  °· You have a seizure. °· You faint. °· You have ongoing diarrhea or vomiting. °MAKE SURE YOU:  °· Understand these instructions. °· Will watch your condition. °· Will get help right away if you are not doing well or get worse. °

## 2011-12-01 NOTE — ED Provider Notes (Signed)
History     CSN: [id number]  Arrival date & time 11/30/11  2255   First MD Initiated Contact with Patient 11/30/11 2310      Chief Complaint  Patient presents with  . Shaking    (Consider location/radiation/quality/duration/timing/severity/associated sxs/prior treatment) HPI Nursing home patient with history of seizure activity on Dilantin, sent to the emergency department for a witnessed episode of shaking. Patient is a very poor historian and cannot provide any reliable history. No history of psychosis, hyponatremia and encephalopathy. No report of fall or trauma. No obvious tongue trauma. No reported incontinence. Level V caveat applies Past Medical History  Diagnosis Date  . Hypertension   . Dysrhythmia   . Rheumatoid arthritis     With known cervical subluxation  . Fall 06/01/11    twice this year  . Incontinence of urine     wears depends  . 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 Psychoses 05/2011    Hallucinations and AMS in setting of low Na, UTI, and steroid use   . Stroke     Past Surgical History  Procedure Date  . Toe surgery     great toe bilateral feet due to arthrits    No family history on file.  History  Substance Use Topics  . Smoking status: Never Smoker   . Smokeless tobacco: Never Used  . Alcohol Use: No    OB History    Grav Para Term Preterm Abortions TAB SAB Ect Mult Living                  Review of Systems Unable to obtain. Level V caveat as above Allergies  Codeine; Colchicine; Famotidine; and Penicillins  Home Medications   Current Outpatient Rx  Name Route Sig Dispense Refill  . VITAMIN D 1000 UNITS PO TABS Oral Take 1,000 Units by mouth daily.      Marland Kitchen FERROUS FUMARATE 325 (106 FE) MG PO TABS Oral Take 1 tablet by mouth daily.     Marland Kitchen FOLIC ACID 1 MG PO TABS Oral Take 1 mg by mouth daily.      Marland Kitchen LORAZEPAM 0.5 MG PO TABS Oral Take 1 tablet (0.5 mg total) by mouth every 8 (eight) hours as needed for anxiety. 20  tablet 0  . PHENYTOIN SODIUM EXTENDED 200 MG PO CAPS Oral Take 1 capsule (200 mg total) by mouth at bedtime. 20 capsule 0  . SENNOSIDES-DOCUSATE SODIUM 8.6-50 MG PO TABS Oral Take 1 tablet by mouth at bedtime. 30 tablet 0  . SODIUM CHLORIDE 1 G PO TABS Oral Take 3 tablets (3 g total) by mouth 3 (three) times daily with meals. 270 tablet 0  . TUBERCULIN PPD 5 UNIT/0.1ML ID SOLN Intradermal Inject 0.1 mLs into the skin every evening. Give for 3 days start 5-32-13 end date 11-27-11    . VITAMIN C 500 MG PO TABS Oral Take 500 mg by mouth daily.        BP 140/70  Pulse 67  Temp 98.1 F (36.7 C)  Resp 20  SpO2 97%  Physical Exam  Nursing note and vitals reviewed. Constitutional: She appears well-developed and well-nourished.  HENT:  Head: Normocephalic and atraumatic.  Mouth/Throat: Oropharynx is clear and moist.  Eyes: Conjunctivae and EOM are normal. Pupils are equal, round, and reactive to light.  Neck: Neck supple. No tracheal deviation present. No thyromegaly present.  Cardiovascular: Normal rate and regular rhythm.   Pulmonary/Chest: Effort normal and breath  sounds normal. No respiratory distress. She has no wheezes. She has no rales. She exhibits no tenderness.  Abdominal: Soft. Bowel sounds are normal. She exhibits no distension. There is no tenderness. There is no rebound and no guarding.  Musculoskeletal: Normal range of motion. She exhibits no edema and no tenderness.  Neurological: She is alert.       Answers some limited questions appropriately, otherwise tangential speech. No focal deficits. No seizure activity.  Skin: Skin is warm and dry. No rash noted.    ED Course  Procedures (including critical care time)  Results for orders placed during the hospital encounter of 11/30/11  CBC      Component Value Range   WBC 3.6 (*) 4.0 - 10.5 (K/uL)   RBC 3.24 (*) 3.87 - 5.11 (MIL/uL)   Hemoglobin 10.3 (*) 12.0 - 15.0 (g/dL)   HCT 40.9 (*) 81.1 - 46.0 (%)   MCV 92.0  78.0 -  100.0 (fL)   MCH 31.8  26.0 - 34.0 (pg)   MCHC 34.6  30.0 - 36.0 (g/dL)   RDW 91.4  78.2 - 95.6 (%)   Platelets 309  150 - 400 (K/uL)  URINALYSIS, ROUTINE W REFLEX MICROSCOPIC      Component Value Range   Color, Urine YELLOW  YELLOW    APPearance CLEAR  CLEAR    Specific Gravity, Urine 1.016  1.005 - 1.030    pH 6.5  5.0 - 8.0    Glucose, UA NEGATIVE  NEGATIVE (mg/dL)   Hgb urine dipstick NEGATIVE  NEGATIVE    Bilirubin Urine NEGATIVE  NEGATIVE    Ketones, ur NEGATIVE  NEGATIVE (mg/dL)   Protein, ur NEGATIVE  NEGATIVE (mg/dL)   Urobilinogen, UA 1.0  0.0 - 1.0 (mg/dL)   Nitrite NEGATIVE  NEGATIVE    Leukocytes, UA NEGATIVE  NEGATIVE   POCT I-STAT, CHEM 8      Component Value Range   Sodium 130 (*) 135 - 145 (mEq/L)   Potassium 3.9  3.5 - 5.1 (mEq/L)   Chloride 98  96 - 112 (mEq/L)   BUN 10  6 - 23 (mg/dL)   Creatinine, Ser 2.13  0.50 - 1.10 (mg/dL)   Glucose, Bld 086 (*) 70 - 99 (mg/dL)   Calcium, Ion 5.78 (*) 1.12 - 1.32 (mmol/L)   TCO2 20  0 - 100 (mmol/L)   Hemoglobin 10.2 (*) 12.0 - 15.0 (g/dL)   HCT 46.9 (*) 62.9 - 46.0 (%)  PHENYTOIN LEVEL, TOTAL      Component Value Range   Phenytoin Lvl 3.3 (*) 10.0 - 20.0 (ug/mL)   Mr Brain Wo Contrast  11/15/2011  *RADIOLOGY REPORT*  Clinical Data: Worsening confusion and lethargy.  Hyponatremia. Rheumatoid arthritis.  MRI HEAD WITHOUT CONTRAST  Technique:  Multiplanar, multiecho pulse sequences of the brain and surrounding structures were obtained according to standard protocol without intravenous contrast.  Comparison: 10/25/2011.  Findings: Motion degraded exam.  Fast sequencing had to be utilized.  Prominent widening of the predental space with posterior superior projection of the dens which is causing severe compression of the cervical medullary junction.  Within the compressed cervical medullary junction, slight increased signal suggesting gliosis and / or edema.  Upper cervical cord below this compression is atrophic.  No acute  infarct.  Global atrophy, ventricular prominence probably related to atrophy rather hydrocephalus.  Mild small vessel disease type changes.  No intracranial hemorrhage.  No intracranial mass lesion detected on this unenhanced exam.  Major intracranial vascular structures are  patent.  IMPRESSION: Severe cervical medullary compression as detailed above.  No acute infarct.  Global atrophy.  Mild small vessel disease type changes.  Critical Value/emergent results were called by telephone at the time of interpretation on 11/15/2011 and  at 2:45 p.m.  to  Dr. Betti Cruz, who verbally acknowledged these results.  Original Report Authenticated By: Fuller Canada, M.D.   Dg Chest Portable 1 View  11/30/2011  *RADIOLOGY REPORT*  Clinical Data: Uncontrollable shaking.  PORTABLE CHEST - 1 VIEW  Comparison: Chest x-ray 11/16/2011.  Findings: Lungs appear mildly hyperexpanded.  No consolidative airspace disease.  No pleural effusions.  Pulmonary vasculature is normal.  Cardiomediastinal silhouette is within normal limits.  IMPRESSION: 1.  Mild hyperexpansion without other radiographic evidence of acute cardiopulmonary disease.  This is nonspecific, and could imply a good inspiratory effort on the part of the patient, or can be seen in the setting of reactive airway disease.  Original Report Authenticated By: Florencia Reasons, M.D.   Dilantin level is low and given Dilantin load in the emergency department.  Family member bedside concerned about low sodium. Old records reviewed as hyponatremia level is 130 today.  MDM   Shaking episode with hyponatremia. Nurse's notes reviewed. Old records reviewed. Labs obtained and reviewed as above. Dilantin load and discharged to nursing facility.        Sunnie Nielsen, MD 12/01/11 [phone]

## 2013-01-04 ENCOUNTER — Emergency Department (HOSPITAL_COMMUNITY): Payer: Medicare PPO

## 2013-01-04 ENCOUNTER — Emergency Department (HOSPITAL_COMMUNITY)
Admission: EM | Admit: 2013-01-04 | Discharge: 2013-01-04 | Disposition: A | Discharge status: Skilled Nursing Facility | Payer: Medicare PPO | Source: Home / Self Care | Attending: Emergency Medicine | Admitting: Emergency Medicine

## 2013-01-04 ENCOUNTER — Encounter (HOSPITAL_COMMUNITY): Payer: Self-pay | Admitting: *Deleted

## 2013-01-04 DIAGNOSIS — Z8669 Personal history of other diseases of the nervous system and sense organs: Secondary | ICD-10-CM | POA: Insufficient documentation

## 2013-01-04 DIAGNOSIS — Z79899 Other long term (current) drug therapy: Secondary | ICD-10-CM | POA: Insufficient documentation

## 2013-01-04 DIAGNOSIS — Y9210 Unspecified residential institution as the place of occurrence of the external cause: Secondary | ICD-10-CM | POA: Insufficient documentation

## 2013-01-04 DIAGNOSIS — F0390 Unspecified dementia without behavioral disturbance: Secondary | ICD-10-CM | POA: Insufficient documentation

## 2013-01-04 DIAGNOSIS — W19XXXA Unspecified fall, initial encounter: Secondary | ICD-10-CM

## 2013-01-04 DIAGNOSIS — S0083XA Contusion of other part of head, initial encounter: Secondary | ICD-10-CM | POA: Insufficient documentation

## 2013-01-04 DIAGNOSIS — Z8639 Personal history of other endocrine, nutritional and metabolic disease: Secondary | ICD-10-CM | POA: Insufficient documentation

## 2013-01-04 DIAGNOSIS — Z862 Personal history of diseases of the blood and blood-forming organs and certain disorders involving the immune mechanism: Secondary | ICD-10-CM | POA: Insufficient documentation

## 2013-01-04 DIAGNOSIS — Z8739 Personal history of other diseases of the musculoskeletal system and connective tissue: Secondary | ICD-10-CM | POA: Insufficient documentation

## 2013-01-04 DIAGNOSIS — Z880 Allergy status to penicillin: Secondary | ICD-10-CM | POA: Insufficient documentation

## 2013-01-04 DIAGNOSIS — S99929A Unspecified injury of unspecified foot, initial encounter: Secondary | ICD-10-CM | POA: Insufficient documentation

## 2013-01-04 DIAGNOSIS — G8929 Other chronic pain: Secondary | ICD-10-CM | POA: Insufficient documentation

## 2013-01-04 DIAGNOSIS — Z87448 Personal history of other diseases of urinary system: Secondary | ICD-10-CM | POA: Insufficient documentation

## 2013-01-04 DIAGNOSIS — S0093XA Contusion of unspecified part of head, initial encounter: Secondary | ICD-10-CM

## 2013-01-04 DIAGNOSIS — Z8659 Personal history of other mental and behavioral disorders: Secondary | ICD-10-CM | POA: Insufficient documentation

## 2013-01-04 DIAGNOSIS — I10 Essential (primary) hypertension: Secondary | ICD-10-CM | POA: Insufficient documentation

## 2013-01-04 DIAGNOSIS — S0003XA Contusion of scalp, initial encounter: Secondary | ICD-10-CM | POA: Insufficient documentation

## 2013-01-04 DIAGNOSIS — R609 Edema, unspecified: Secondary | ICD-10-CM | POA: Insufficient documentation

## 2013-01-04 DIAGNOSIS — M25561 Pain in right knee: Secondary | ICD-10-CM

## 2013-01-04 DIAGNOSIS — Y939 Activity, unspecified: Secondary | ICD-10-CM | POA: Insufficient documentation

## 2013-01-04 DIAGNOSIS — Z8673 Personal history of transient ischemic attack (TIA), and cerebral infarction without residual deficits: Secondary | ICD-10-CM | POA: Insufficient documentation

## 2013-01-04 DIAGNOSIS — S8990XA Unspecified injury of unspecified lower leg, initial encounter: Secondary | ICD-10-CM | POA: Insufficient documentation

## 2013-01-04 DIAGNOSIS — W07XXXA Fall from chair, initial encounter: Secondary | ICD-10-CM | POA: Insufficient documentation

## 2013-01-04 NOTE — ED Provider Notes (Signed)
History    CSN: [id number] Arrival date & time 01/04/13  0456  First MD Initiated Contact with Patient 01/04/13 [phone]     Chief Complaint  Patient presents with  . Fall   (Consider location/radiation/quality/duration/timing/severity/associated sxs/prior Treatment) HPI 79 yo female presents from her nursing facility via EMS after fall from chair.  It is reported pt fell forward out of her chair and struck her left head.  She is complaining of right knee pain, swelling.  Unknown if this is acute or chronic.  Unknown LOC.  Pt with h/o dementia, psychosis.  Past Medical History  Diagnosis Date  . Hypertension   . Dysrhythmia   . Fall 06/01/11    twice this year  . Incontinence of urine     wears depends  . 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 Psychoses 05/2011    Hallucinations and AMS in setting of low Na, UTI, and steroid use   . Stroke   . Seizures   . Rheumatoid arthritis(714.0)     With known cervical subluxation  . Encephalopathy, unspecified   . Anemia   . Generalized anxiety disorder    Past Surgical History  Procedure Laterality Date  . Toe surgery      great toe bilateral feet due to arthrits   History reviewed. No pertinent family history. History  Substance Use Topics  . Smoking status: Never Smoker   . Smokeless tobacco: Never Used  . Alcohol Use: No   OB History   Grav Para Term Preterm Abortions TAB SAB Ect Mult Living                 Review of Systems  Unable to perform ROS: Dementia    Allergies  Colchicine; Famotidine; and Penicillins  Home Medications   Current Outpatient Rx  Name  Route  Sig  Dispense  Refill  . acetaminophen (TYLENOL) 500 MG tablet   Oral   Take 500 mg by mouth every 6 (six) hours as needed for pain.         Marland Kitchen HYDROcodone-acetaminophen (NORCO) 10-325 MG per tablet   Oral   Take 1 tablet by mouth every 6 (six) hours as needed for pain.         Marland Kitchen levothyroxine (SYNTHROID, LEVOTHROID) 25 MCG  tablet   Oral   Take 25 mcg by mouth daily before breakfast.         . OLANZapine (ZYPREXA) 10 MG tablet   Oral   Take 10 mg by mouth daily.         . polyethylene glycol (MIRALAX / GLYCOLAX) packet   Oral   Take 17 g by mouth every 3 (three) days.         . sodium chloride 1 G tablet   Oral   Take 1 g by mouth 3 (three) times daily.         Marland Kitchen torsemide (DEMADEX) 20 MG tablet   Oral   Take 20 mg by mouth daily.          BP 166/93  Pulse 72  Temp(Src) 98.3 F (36.8 C) (Oral)  Resp 16  SpO2 96% Physical Exam  Nursing note and vitals reviewed. Constitutional: She appears well-developed and well-nourished.  HENT:  Head: Normocephalic.  Right Ear: External ear normal.  Left Ear: External ear normal.  Nose: Nose normal.  Mouth/Throat: Oropharynx is clear and moist.  Contusion to left scalp  Eyes: Conjunctivae and EOM are  normal. Pupils are equal, round, and reactive to light.  Neck: Normal range of motion. Neck supple. No JVD present. No tracheal deviation present. No thyromegaly present.  Cardiovascular: Normal rate, regular rhythm, normal heart sounds and intact distal pulses.  Exam reveals no gallop and no friction rub.   No murmur heard. Pulmonary/Chest: Effort normal and breath sounds normal. No stridor. No respiratory distress. She has no wheezes. She has no rales. She exhibits no tenderness.  Abdominal: Soft. Bowel sounds are normal. She exhibits no distension and no mass. There is no tenderness. There is no rebound and no guarding.  Musculoskeletal: Normal range of motion. She exhibits edema and tenderness.  Tenderness and edema to right knee.  No deformity, mild effusion.  TTP over joint lines bilaterally.  Slow but normal ROM of the knee.  Lymphadenopathy:    She has no cervical adenopathy.  Neurological: She is alert. She has normal reflexes. She exhibits normal muscle tone. Coordination normal.  Skin: Skin is warm and dry. No rash noted. No erythema. No  pallor.  Psychiatric: She has a normal mood and affect. Her behavior is normal. Judgment and thought content normal.    ED Course  Procedures (including critical care time) Labs Reviewed - No data to display Ct Head Wo Contrast  01/04/2013   *RADIOLOGY REPORT*  Clinical Data:  The patient fell out of a chair striking the left side of the head.  Left scalp contusion.  Head and neck pain.  CT HEAD WITHOUT CONTRAST CT CERVICAL SPINE WITHOUT CONTRAST  Technique:  Multidetector CT imaging of the head and cervical spine was performed following the standard protocol without intravenous contrast.  Multiplanar CT image reconstructions of the cervical spine were also generated.  Comparison:  10/24/2011  CT HEAD  Findings: Diffuse cerebral atrophy.  Mild ventricular dilatation consistent with central atrophy.  Low attenuation changes in the deep white matter consistent with small vessel ischemia.  No mass effect or midline shift.  No abnormal extra-axial fluid collections.  Gray-white matter junctions are distinct.  Basal cisterns are not effaced.  No evidence of acute intracranial hemorrhage.  No depressed skull fractures.  Visualized paranasal sinuses and mastoid air cells are not opacified.  Note that the odontoid process is present in the cisterna magna consistent with basilar invagination.  IMPRESSION: No acute intracranial abnormalities.  Chronic atrophy and small vessel ischemic change.  CT CERVICAL SPINE  Findings: There is chronic abnormal alignment of the C1-2 interspace with posterior subluxation of C2 with respect to C1 resulting in increased space between the anterior arch of C1 and the odontoid process and decreased space between the posterior aspect of the odontoid process and the posterior elements of C1. Canal measures 6.5 mm at its lowest point, similar to previous study. The tip of the odontoid process extends above the level of the clivus. 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 This is  stable since the previous study.  The remainder the cervical vertebrae demonstrate normal alignment.  Facet joints are normally aligned.  Degenerative narrowing of the cervical disc spaces with endplate hypertrophic changes.  No vertebral compression deformities.  No prevertebral soft tissue swelling.  No focal bone lesion or bone destruction.  Bone cortex and trabecular architecture appear intact.  Diffuse bone demineralization.  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  This is stable since the previous study.  No acute displaced  fractures are identified.   Original Report Authenticated By: Burman Nieves, M.D.   Ct Cervical Spine Wo Contrast  01/04/2013   *RADIOLOGY REPORT*  Clinical Data:  The patient fell out of a chair striking the left side of the head.  Left scalp contusion.  Head and neck pain.  CT HEAD WITHOUT CONTRAST CT CERVICAL SPINE WITHOUT CONTRAST  Technique:  Multidetector CT imaging of the head and cervical spine was performed following the standard protocol without intravenous contrast.  Multiplanar CT image reconstructions of the cervical spine were also generated.  Comparison:  10/24/2011  CT HEAD  Findings: Diffuse cerebral atrophy.  Mild ventricular dilatation consistent with central atrophy.  Low attenuation changes in the deep white matter consistent with small vessel ischemia.  No mass effect or midline shift.  No abnormal extra-axial fluid collections.  Gray-white matter junctions are distinct.  Basal cisterns are not effaced.  No evidence of acute intracranial hemorrhage.  No depressed skull fractures.  Visualized paranasal sinuses and mastoid air cells are not opacified.  Note that the odontoid process is present in the cisterna magna consistent with basilar invagination.  IMPRESSION: No acute intracranial abnormalities.  Chronic atrophy and small  vessel ischemic change.  CT CERVICAL SPINE  Findings: There is chronic abnormal alignment of the C1-2 interspace with posterior subluxation of C2 with respect to C1 resulting in increased space between the anterior arch of C1 and the odontoid process and decreased space between the posterior aspect of the odontoid process and the posterior elements of C1. Canal measures 6.5 mm at its lowest point, similar to previous study. The tip of the odontoid process extends above the level of the clivus. 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 This is stable since the previous study.  The remainder the cervical vertebrae demonstrate normal alignment.  Facet joints are normally aligned.  Degenerative narrowing of the cervical disc spaces with endplate hypertrophic changes.  No vertebral compression deformities.  No prevertebral soft tissue swelling.  No focal bone lesion or bone destruction.  Bone cortex and trabecular architecture appear intact.  Diffuse bone demineralization.  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  This is stable since the previous study.  No acute displaced fractures are identified.   Original Report Authenticated By: Burman Nieves, M.D.   Dg Knee Complete 4 Views Right  01/04/2013   *RADIOLOGY REPORT*  Clinical Data: Pain after fall tonight.  RIGHT KNEE - COMPLETE 4+ VIEW  Comparison: None.  Findings: Diffuse bone demineralization.  Degenerative changes with narrowed lateral greater than medial compartment and tricompartmental hypertrophic changes.  There is a small right knee effusion without hemarthrosis.  No displaced fractures are demonstrated in the right knee.  No radiopaque soft tissue foreign bodies.  IMPRESSION: Diffuse demineralization and degenerative change in the right knee. Small effusion.  No displaced fractures identified.   Original  Report Authenticated By: Burman Nieves, M.D.   1. Fall, initial encounter   2. Head contusion, initial encounter   3. Chronic knee pain, right     MDM  79 yo female s/p fall, will get knee films, ct head/cspine.  No injuries to repair.  7:25 AM Called [facility], talked with patient's nurse.  She has chronic right knee pain and swelling.  Workup otherwise normal, chronic findings on exam.  Will d/c back to nursing facility.  Olivia Mackie, MD 01/04/13 [phone]

## 2013-01-04 NOTE — ED Notes (Signed)
Pt arrived via EMS from [facility] where she resides,  She fell forward out of chair striking left side head,  No LOC,  Right knee pain,

## 2013-01-04 NOTE — ED Notes (Signed)
OZH:[id number]<HQ> Expected date:<BR> Expected time:<BR> Means of arrival:<BR> Comments:<BR> EMS/from SNF-fall/immobilized

## 2013-01-04 NOTE — ED Notes (Signed)
[facility] called for transportation to [facility]

## 2013-01-04 NOTE — ED Notes (Signed)
Pt has neck brace on upon arrival to ed that was placed by EMS

## 2013-01-05 ENCOUNTER — Emergency Department (HOSPITAL_COMMUNITY): Payer: Medicare PPO

## 2013-01-05 ENCOUNTER — Encounter (HOSPITAL_COMMUNITY): Payer: Self-pay

## 2013-01-05 ENCOUNTER — Inpatient Hospital Stay (HOSPITAL_COMMUNITY)
Admission: EM | Admit: 2013-01-05 | Discharge: 2013-01-11 | DRG: 690 | Disposition: A | Discharge status: Home Health | Payer: Medicare PPO | Attending: Internal Medicine | Admitting: Internal Medicine

## 2013-01-05 DIAGNOSIS — I7090 Unspecified atherosclerosis: Secondary | ICD-10-CM | POA: Diagnosis present

## 2013-01-05 DIAGNOSIS — W07XXXD Fall from chair, subsequent encounter: Secondary | ICD-10-CM

## 2013-01-05 DIAGNOSIS — R4182 Altered mental status, unspecified: Secondary | ICD-10-CM | POA: Diagnosis present

## 2013-01-05 DIAGNOSIS — K8689 Other specified diseases of pancreas: Secondary | ICD-10-CM

## 2013-01-05 DIAGNOSIS — F0390 Unspecified dementia without behavioral disturbance: Secondary | ICD-10-CM | POA: Diagnosis present

## 2013-01-05 DIAGNOSIS — N390 Urinary tract infection, site not specified: Principal | ICD-10-CM

## 2013-01-05 DIAGNOSIS — I10 Essential (primary) hypertension: Secondary | ICD-10-CM

## 2013-01-05 DIAGNOSIS — W050XXA Fall from non-moving wheelchair, initial encounter: Secondary | ICD-10-CM | POA: Diagnosis present

## 2013-01-05 DIAGNOSIS — M069 Rheumatoid arthritis, unspecified: Secondary | ICD-10-CM

## 2013-01-05 DIAGNOSIS — K862 Cyst of pancreas: Secondary | ICD-10-CM | POA: Diagnosis present

## 2013-01-05 DIAGNOSIS — M4802 Spinal stenosis, cervical region: Secondary | ICD-10-CM | POA: Diagnosis present

## 2013-01-05 DIAGNOSIS — K863 Pseudocyst of pancreas: Secondary | ICD-10-CM | POA: Diagnosis present

## 2013-01-05 DIAGNOSIS — D649 Anemia, unspecified: Secondary | ICD-10-CM

## 2013-01-05 DIAGNOSIS — B9689 Other specified bacterial agents as the cause of diseases classified elsewhere: Secondary | ICD-10-CM | POA: Diagnosis present

## 2013-01-05 DIAGNOSIS — K5900 Constipation, unspecified: Secondary | ICD-10-CM

## 2013-01-05 DIAGNOSIS — K869 Disease of pancreas, unspecified: Secondary | ICD-10-CM

## 2013-01-05 DIAGNOSIS — Z9181 History of falling: Secondary | ICD-10-CM

## 2013-01-05 DIAGNOSIS — W07XXXA Fall from chair, initial encounter: Secondary | ICD-10-CM

## 2013-01-05 DIAGNOSIS — Y9210 Unspecified residential institution as the place of occurrence of the external cause: Secondary | ICD-10-CM | POA: Diagnosis present

## 2013-01-05 DIAGNOSIS — D638 Anemia in other chronic diseases classified elsewhere: Secondary | ICD-10-CM | POA: Diagnosis present

## 2013-01-05 DIAGNOSIS — F29 Unspecified psychosis not due to a substance or known physiological condition: Secondary | ICD-10-CM

## 2013-01-05 DIAGNOSIS — E039 Hypothyroidism, unspecified: Secondary | ICD-10-CM

## 2013-01-05 DIAGNOSIS — R269 Unspecified abnormalities of gait and mobility: Secondary | ICD-10-CM

## 2013-01-05 DIAGNOSIS — E876 Hypokalemia: Secondary | ICD-10-CM

## 2013-01-05 LAB — CBC WITH DIFFERENTIAL/PLATELET
Basophils Absolute: 0 10*3/uL (ref 0.0–0.1)
Basophils Relative: 0 % (ref 0–1)
Hemoglobin: 10.8 g/dL — ABNORMAL LOW (ref 12.0–15.0)
MCHC: 32.9 g/dL (ref 30.0–36.0)
Monocytes Relative: 14 % — ABNORMAL HIGH (ref 3–12)
Neutro Abs: 3 10*3/uL (ref 1.7–7.7)
Neutrophils Relative %: 60 % (ref 43–77)

## 2013-01-05 LAB — COMPREHENSIVE METABOLIC PANEL
Albumin: 3.1 g/dL — ABNORMAL LOW (ref 3.5–5.2)
BUN: 12 mg/dL (ref 6–23)
Calcium: 9.4 mg/dL (ref 8.4–10.5)
Creatinine, Ser: 0.56 mg/dL (ref 0.50–1.10)
Total Protein: 7.4 g/dL (ref 6.0–8.3)

## 2013-01-05 LAB — URINALYSIS, ROUTINE W REFLEX MICROSCOPIC
Glucose, UA: NEGATIVE mg/dL
Protein, ur: NEGATIVE mg/dL
pH: 5.5 (ref 5.0–8.0)

## 2013-01-05 LAB — PROTIME-INR
INR: 1.04 (ref 0.00–1.49)
Prothrombin Time: 13.4 seconds (ref 11.6–15.2)

## 2013-01-05 LAB — CK: Total CK: 117 U/L (ref 7–177)

## 2013-01-05 LAB — URINE MICROSCOPIC-ADD ON

## 2013-01-05 LAB — LIPASE, BLOOD: Lipase: 10 U/L — ABNORMAL LOW (ref 11–59)

## 2013-01-05 MED ORDER — MAGNESIUM HYDROXIDE 400 MG/5ML PO SUSP
30.0000 mL | Freq: Every day | ORAL | Status: AC
  Administered 2013-01-06: 30 mL via ORAL
  Filled 2013-01-05 (×2): qty 30

## 2013-01-05 MED ORDER — CEFTRIAXONE SODIUM 1 G IJ SOLR
1.0000 g | INTRAMUSCULAR | Status: DC
  Administered 2013-01-06: 1 g via INTRAVENOUS
  Filled 2013-01-05 (×2): qty 10

## 2013-01-05 MED ORDER — ACETAMINOPHEN 500 MG PO TABS
500.0000 mg | ORAL_TABLET | Freq: Four times a day (QID) | ORAL | Status: DC | PRN
  Administered 2013-01-07 – 2013-01-11 (×3): 500 mg via ORAL
  Filled 2013-01-05 (×4): qty 1

## 2013-01-05 MED ORDER — POTASSIUM CHLORIDE 10 MEQ/100ML IV SOLN
10.0000 meq | INTRAVENOUS | Status: AC
  Administered 2013-01-06 (×3): 10 meq via INTRAVENOUS
  Filled 2013-01-05 (×3): qty 100

## 2013-01-05 MED ORDER — ONDANSETRON HCL 4 MG PO TABS: 4.0000 mg | ORAL_TABLET | Freq: Four times a day (QID) | ORAL | Status: DC | PRN

## 2013-01-05 MED ORDER — ENOXAPARIN SODIUM 40 MG/0.4ML ~~LOC~~ SOLN
40.0000 mg | SUBCUTANEOUS | Status: DC
Start: 2013-01-06 — End: 2013-01-08
  Administered 2013-01-06 – 2013-01-08 (×3): 40 mg via SUBCUTANEOUS
  Filled 2013-01-05 (×3): qty 0.4

## 2013-01-05 MED ORDER — SODIUM CHLORIDE 0.9 % IV SOLN
INTRAVENOUS | Status: AC
  Administered 2013-01-05: 75 mL/h via INTRAVENOUS

## 2013-01-05 MED ORDER — ACETAMINOPHEN 325 MG PO TABS
650.0000 mg | ORAL_TABLET | Freq: Once | ORAL | Status: AC
  Administered 2013-01-05: 650 mg via ORAL
  Filled 2013-01-05: qty 2

## 2013-01-05 MED ORDER — LEVOTHYROXINE SODIUM 25 MCG PO TABS
25.0000 ug | ORAL_TABLET | Freq: Every day | ORAL | Status: DC
  Administered 2013-01-06 – 2013-01-11 (×6): 25 ug via ORAL
  Filled 2013-01-05 (×7): qty 1

## 2013-01-05 MED ORDER — DEXTROSE 5 % IV SOLN
1.0000 g | Freq: Once | INTRAVENOUS | Status: AC
  Administered 2013-01-05: 1 g via INTRAVENOUS
  Filled 2013-01-05: qty 10

## 2013-01-05 MED ORDER — ONDANSETRON HCL 4 MG/2ML IJ SOLN: 4.0000 mg | Freq: Four times a day (QID) | INTRAMUSCULAR | Status: DC | PRN

## 2013-01-05 MED ORDER — ALUM & MAG HYDROXIDE-SIMETH 200-200-20 MG/5ML PO SUSP
30.0000 mL | Freq: Four times a day (QID) | ORAL | Status: DC | PRN
Start: 2013-01-05 — End: 2013-01-11

## 2013-01-05 MED ORDER — FLEET ENEMA 7-19 GM/118ML RE ENEM
1.0000 | ENEMA | Freq: Every day | RECTAL | Status: DC | PRN
Start: 2013-01-05 — End: 2013-01-11

## 2013-01-05 MED ORDER — ONDANSETRON HCL 4 MG/2ML IJ SOLN: 4.0000 mg | Freq: Three times a day (TID) | INTRAMUSCULAR | Status: DC | PRN

## 2013-01-05 MED ORDER — BISACODYL 10 MG RE SUPP
10.0000 mg | Freq: Every day | RECTAL | Status: DC | PRN
  Administered 2013-01-06: 10 mg via RECTAL
  Filled 2013-01-05 (×3): qty 1

## 2013-01-05 MED ORDER — SODIUM CHLORIDE 1 G PO TABS
1.0000 g | ORAL_TABLET | Freq: Three times a day (TID) | ORAL | Status: DC
  Administered 2013-01-06 – 2013-01-11 (×17): 1 g via ORAL
  Filled 2013-01-05 (×19): qty 1

## 2013-01-05 MED ORDER — OLANZAPINE 10 MG PO TABS
10.0000 mg | ORAL_TABLET | Freq: Every day | ORAL | Status: DC
  Administered 2013-01-06 – 2013-01-10 (×6): 10 mg via ORAL
  Filled 2013-01-05 (×7): qty 1

## 2013-01-05 MED ORDER — POLYETHYLENE GLYCOL 3350 17 G PO PACK
17.0000 g | PACK | Freq: Every day | ORAL | Status: DC
  Administered 2013-01-06: 17 g via ORAL
  Filled 2013-01-05: qty 1

## 2013-01-05 NOTE — ED Provider Notes (Signed)
History    CSN: [id number] Arrival date & time 01/05/13  1722  First MD Initiated Contact with Patient 01/05/13 1726     Chief Complaint  Patient presents with  . Fall   (Consider location/radiation/quality/duration/timing/severity/associated sxs/prior Treatment) HPI Comments: Patient presents from nursing home after fall. This was unwitnessed. She states she was sitting in a chair and slight back hitting her head. Denies loss of consciousness. She was seen here 36 hours ago after a fall as well. She complains of pain in her back that radiates down both legs and has been chronic for 3 weeks. Denies any bowel or bladder incontinence. Denies any fevers or chills. States she does not use a walker or in a wheelchair but according to nursing home notes is wheelchair bound.  The history is provided by the patient and the EMS personnel.   Past Medical History  Diagnosis Date  . Hypertension   . Dysrhythmia   . Fall 06/01/11    twice this year  . Incontinence of urine     wears depends  . 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 Psychoses 05/2011    Hallucinations and AMS in setting of low Na, UTI, and steroid use   . Stroke   . Seizures   . Rheumatoid arthritis(714.0)     With known cervical subluxation  . Encephalopathy, unspecified   . Anemia   . Generalized anxiety disorder    Past Surgical History  Procedure Laterality Date  . Toe surgery      great toe bilateral feet due to arthrits   No family history on file. History  Substance Use Topics  . Smoking status: Never Smoker   . Smokeless tobacco: Never Used  . Alcohol Use: No   OB History   Grav Para Term Preterm Abortions TAB SAB Ect Mult Living                 Review of Systems  Unable to perform ROS: Dementia    Allergies  Colchicine; Famotidine; and Penicillins  Home Medications   No current outpatient prescriptions on file. BP 133/64  Pulse 61  Temp(Src) 97.8 F (36.6 C) (Oral)  Resp 18   Ht 5\' 3"  (1.6 m)  Wt 113 lb (51.256 kg)  BMI 20.02 kg/m2  SpO2 96% Physical Exam  Constitutional: She appears well-developed and well-nourished. No distress.  HENT:  Head: Normocephalic and atraumatic.  Mouth/Throat: Oropharynx is clear and moist. No oropharyngeal exudate.  Eyes: Conjunctivae and EOM are normal. Pupils are equal, round, and reactive to light.  Neck: Normal range of motion. Neck supple.  No C-spine pain, step-off or deformity  Cardiovascular: Normal rate, regular rhythm, normal heart sounds and intact distal pulses.   No murmur heard. +2 DP and femoral pulses bilaterally  Pulmonary/Chest: Effort normal and breath sounds normal. No respiratory distress.  Abdominal: Soft. There is no tenderness. There is no rebound and no guarding.  Musculoskeletal: Normal range of motion. She exhibits tenderness. She exhibits no edema.  5 out of 5 strength in lower extremity is. Ankle flexion and extension intact. Great toe extension intact. Paraspinal lumbar tenderness. No T or L spine tenderness  Neurological: She is alert. No cranial nerve deficit. She exhibits normal muscle tone. Coordination normal.  Skin: Skin is warm.    ED Course  Procedures (including critical care time) Labs Reviewed  COMPREHENSIVE METABOLIC PANEL - Abnormal; Notable for the following:    Potassium 3.2 (*)  Glucose, Bld 107 (*)    Albumin 3.1 (*)    GFR calc non Af Amer 86 (*)    All other components within normal limits  URINALYSIS, ROUTINE W REFLEX MICROSCOPIC - Abnormal; Notable for the following:    APPearance CLOUDY (*)    Hgb urine dipstick SMALL (*)    Leukocytes, UA LARGE (*)    All other components within normal limits  CBC WITH DIFFERENTIAL - Abnormal; Notable for the following:    RBC 3.81 (*)    Hemoglobin 10.8 (*)    HCT 32.8 (*)    Monocytes Relative 14 (*)    All other components within normal limits  URINE MICROSCOPIC-ADD ON - Abnormal; Notable for the following:    Bacteria, UA  MANY (*)    All other components within normal limits  LIPASE, BLOOD - Abnormal; Notable for the following:    Lipase 10 (*)    All other components within normal limits  URINE CULTURE  CK  PROTIME-INR  MAGNESIUM  CBC WITH DIFFERENTIAL  BASIC METABOLIC PANEL  CANCER ANTIGEN 19-9  HEMOGLOBIN AND HEMATOCRIT, BLOOD  VITAMIN B12  FOLATE  IRON AND TIBC  FERRITIN  RETICULOCYTES  TSH   Ct Abdomen Pelvis Wo Contrast  01/05/2013   *RADIOLOGY REPORT*  Clinical Data: Multiple falls, including one today.  Bilateral leg and shoulder pain for the past 3 weeks.  No new pain.  CT ABDOMEN AND PELVIS WITHOUT CONTRAST  Technique:  Multidetector CT imaging of the abdomen and pelvis was performed following the standard protocol without intravenous contrast.  Comparison: Lumbar spine radiographs obtained earlier today.  Findings: Unremarkable noncontrasted appearance of the liver, spleen, gallbladder, kidneys and urinary bladder.  The adrenal glands are not well visualized.  Cystic area at the junction of the body and tail of the pancreas, measuring 2.6 x 1.9 cm on image number 19.  Prominent stool throughout the colon.  No enlarged lymph nodes. Atheromatous arterial calcifications.  Minimal linear atelectasis or scarring at both lung bases.  Mild dextroconvex lumbar scoliosis.  Approximately 30% T11 superior endplate compression deformity with minimal retropulsion.  No acute fracture lines are visualized. Mild anterior spur formation at that level.  The compression deformity is unchanged since a lateral radiographic view of the chest dated 06/14/2011.  IMPRESSION:  1.  2.6 x 1.9 cm cystic neoplasm or pseudocyst in the pancreas at the junction of the body and tail of the pancreas.  This could be better evaluated with an elective pre and postcontrast magnetic resonance imaging examination of the pancreas. 2.  Old T11 compression fracture. 3.  No acute abnormality. 4.  Large amount of stool throughout the colon.    Original Report Authenticated By: Beckie Salts, M.D.   Dg Chest 2 View  01/05/2013   *RADIOLOGY REPORT*  Clinical Data: Fall  CHEST - 2 VIEW  Comparison: 11/30/2011  Findings: Cardiac and mediastinal contours are normal.  Lungs are clear without infiltrate or effusion.  Negative for heart failure. No fracture is identified.  IMPRESSION: No acute abnormality.   Original Report Authenticated By: Janeece Riggers, M.D.   Dg Lumbar Spine Complete  01/05/2013   *RADIOLOGY REPORT*  Clinical Data: Fall.  Back pain with leg pain  LUMBAR SPINE - COMPLETE 4+ VIEW  Comparison: None  Findings: Negative for fracture.  Normal alignment.  Disc spaces are normal.  There is mild dextroscoliosis.  Constipation and ileus are noted.  IMPRESSION: Negative for lumbar fracture.   Original Report Authenticated By:  Janeece Riggers, M.D.   Dg Pelvis 1-2 Views  01/05/2013   *RADIOLOGY REPORT*  Clinical Data: Fall  PELVIS - 1-2 VIEW  Comparison: 07/27/2010  Findings: Negative for pelvic or hip fracture.  Both hip joints appear normal.  No focal bony lesion.  IMPRESSION: Negative for fracture.   Original Report Authenticated By: Janeece Riggers, M.D.   Ct Head Wo Contrast  01/05/2013   *RADIOLOGY REPORT*  Clinical Data: Unwitnessed fall.  CT HEAD WITHOUT CONTRAST  Technique:  Contiguous axial images were obtained from the base of the skull through the vertex without contrast.  Comparison: 01/04/2013 head CT and cervical spine CT.  Findings: No skull fracture or intracranial hemorrhage.  Atrophy without hydrocephalus.  Small vessel disease type changes without CT evidence of large acute infarct.  No intracranial mass lesion detected on this unenhanced exam.  Superior displacement of the dens.  This is noted on the recent cervical spine CT where prominent separation from the C1 ring is also present. Prominent compression of the upper cervical cord is once again noted.  IMPRESSION: No skull fracture or intracranial hemorrhage.  Superior displacement  of the dens.  This is noted on the recent cervical spine CT where prominent separation from the C1 ring is also present. Prominent compression of the upper cervical cord is once again noted.   Original Report Authenticated By: Lacy Duverney, M.D.   Ct Head Wo Contrast  01/04/2013   *RADIOLOGY REPORT*  Clinical Data:  The patient fell out of a chair striking the left side of the head.  Left scalp contusion.  Head and neck pain.  CT HEAD WITHOUT CONTRAST CT CERVICAL SPINE WITHOUT CONTRAST  Technique:  Multidetector CT imaging of the head and cervical spine was performed following the standard protocol without intravenous contrast.  Multiplanar CT image reconstructions of the cervical spine were also generated.  Comparison:  10/24/2011  CT HEAD  Findings: Diffuse cerebral atrophy.  Mild ventricular dilatation consistent with central atrophy.  Low attenuation changes in the deep white matter consistent with small vessel ischemia.  No mass effect or midline shift.  No abnormal extra-axial fluid collections.  Gray-white matter junctions are distinct.  Basal cisterns are not effaced.  No evidence of acute intracranial hemorrhage.  No depressed skull fractures.  Visualized paranasal sinuses and mastoid air cells are not opacified.  Note that the odontoid process is present in the cisterna magna consistent with basilar invagination.  IMPRESSION: No acute intracranial abnormalities.  Chronic atrophy and small vessel ischemic change.  CT CERVICAL SPINE  Findings: There is chronic abnormal alignment of the C1-2 interspace with posterior subluxation of C2 with respect to C1 resulting in increased space between the anterior arch of C1 and the odontoid process and decreased space between the posterior aspect of the odontoid process and the posterior elements of C1. Canal measures 6.5 mm at its lowest point, similar to previous study. The tip of the odontoid process extends above the level of the clivus. 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 This is stable since the previous study.  The remainder the cervical vertebrae demonstrate normal alignment.  Facet joints are normally aligned.  Degenerative narrowing of the cervical disc spaces with endplate hypertrophic changes.  No vertebral compression deformities.  No prevertebral soft tissue swelling.  No focal bone lesion or bone destruction.  Bone cortex and trabecular architecture appear intact.  Diffuse bone demineralization.  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  This is stable since the previous study.  No acute displaced fractures are identified.   Original Report Authenticated By: Burman Nieves, M.D.   Ct Cervical Spine Wo Contrast  01/04/2013   *RADIOLOGY REPORT*  Clinical Data:  The patient fell out of a chair striking the left side of the head.  Left scalp contusion.  Head and neck pain.  CT HEAD WITHOUT CONTRAST CT CERVICAL SPINE WITHOUT CONTRAST  Technique:  Multidetector CT imaging of the head and cervical spine was performed following the standard protocol without intravenous contrast.  Multiplanar CT image reconstructions of the cervical spine were also generated.  Comparison:  10/24/2011  CT HEAD  Findings: Diffuse cerebral atrophy.  Mild ventricular dilatation consistent with central atrophy.  Low attenuation changes in the deep white matter consistent with small vessel ischemia.  No mass effect or midline shift.  No abnormal extra-axial fluid collections.  Gray-white matter junctions are distinct.  Basal cisterns are not effaced.  No evidence of acute intracranial hemorrhage.  No depressed skull fractures.  Visualized paranasal sinuses and mastoid air cells are not opacified.  Note that the odontoid process is present in the cisterna magna consistent with basilar invagination.  IMPRESSION: No acute intracranial  abnormalities.  Chronic atrophy and small vessel ischemic change.  CT CERVICAL SPINE  Findings: There is chronic abnormal alignment of the C1-2 interspace with posterior subluxation of C2 with respect to C1 resulting in increased space between the anterior arch of C1 and the odontoid process and decreased space between the posterior aspect of the odontoid process and the posterior elements of C1. Canal measures 6.5 mm at its lowest point, similar to previous study. The tip of the odontoid process extends above the level of the clivus. 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 This is stable since the previous study.  The remainder the cervical vertebrae demonstrate normal alignment.  Facet joints are normally aligned.  Degenerative narrowing of the cervical disc spaces with endplate hypertrophic changes.  No vertebral compression deformities.  No prevertebral soft tissue swelling.  No focal bone lesion or bone destruction.  Bone cortex and trabecular architecture appear intact.  Diffuse bone demineralization.  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  This is stable since the previous study.  No acute displaced fractures are identified.   Original Report Authenticated By: Burman Nieves, M.D.   Dg Knee Complete 4 Views Right  01/04/2013   *RADIOLOGY REPORT*  Clinical Data: Pain after fall tonight.  RIGHT KNEE - COMPLETE 4+ VIEW  Comparison: None.  Findings: Diffuse bone demineralization.  Degenerative changes with narrowed lateral greater than medial compartment and tricompartmental hypertrophic changes.  There is a small right knee effusion without hemarthrosis.  No displaced fractures are demonstrated in the right knee.  No radiopaque soft tissue foreign bodies.  IMPRESSION: Diffuse demineralization and degenerative change in the right knee. Small effusion.  No  displaced fractures identified.   Original Report Authenticated By: Burman Nieves, M.D.   1. Gait disturbance   2. Pancreatic mass   3. Constipation   4. Hypokalemia   5. Hypothyroidism   6. Rheumatoid arthritis(714.0)   7. UTI (urinary tract infection), uncomplicated   8. Fall from chair, initial encounter     MDM  Patient presents from home with recurrent falls. Seen yesterday for same. Complains of pain  in her bilateral legs and low back. Denies incontinence or focal weakness.  Patient found to have UTI. Labs otherwise at baseline. Stable hemoglobin. She is unable to stand or ambulate though she has equal strength and pulses in her bilateral lower extremities.  Her x-rays are negative for fractures. Chronic changes in C spine with canal stenosis, previously seen by neurosurgery who recommended conservative management. Abdominal CT shows cystic neoplasm versus pseudocyst as well as large stool burden. No pelvic fractures. Will admit for further workup of pancreas mass and inability to ambulate. Doubt acute cord compression given equal strength in lower extremities. Doubt vascular pathology as pulses intact.  Glynn Octave, MD 01/06/13 [phone]

## 2013-01-05 NOTE — ED Notes (Signed)
Pt notified me that she is allergic to shellfish and Iodine.  Spoke with RN who advised to use Chlorohexadine swabs for in and out prep

## 2013-01-05 NOTE — ED Notes (Signed)
MD at bedside. 

## 2013-01-05 NOTE — ED Notes (Addendum)
Per EMS, Pt, from [facility], presents after an unwitnessed fall.  Facility sts Pt fell out of wheelchair and onto a wood floor.  Denies hitting head and LOC.  Pt sts chronic bilateral leg and shoulder pain x 3 weeks.  Pain score 8/10.  Denies new pain.  Pt was seen at [facility] yesterday for a fall.  Vitals are stable.  A & Ox4.  NAD noted.

## 2013-01-05 NOTE — ED Notes (Signed)
Discussed in and out with Tora Duck, RN.  She stated that she would enter order.

## 2013-01-05 NOTE — ED Notes (Signed)
Notified Dr. Manus Gunning that pt unable to lift upper body enough to get out of bed.

## 2013-01-05 NOTE — ED Notes (Signed)
YQM:[id number]<ON> Expected date:01/05/13<BR> Expected time: 5:16 PM<BR> Means of arrival:Ambulance<BR> Comments:<BR> Evaluation

## 2013-01-05 NOTE — H&P (Signed)
[facility] History and Physical  [name] ZOX:[id number] DOB: [birth year] DOA: 01/05/2013  Referring physician: [facility] PCP: Tina Spikes, DO   Chief Complaint: falls  HPI: [name] is a 79 y.o. female who has fallen out of her wheelchair several times this week. She was evaluated in the emergency room yesterday and released. She fell again today. She reports her appetite has been poor for about 3 weeks. She comes from nursing home. She is wheelchair-bound. She is chronically constipated. She had mild nausea earlier in the week, but no vomiting. No abdominal pain. No dysuria. No fevers or chills. She complains of back pain. She has had multiple scans and x-rays. Workup is significant for urinary tract infection, large stool burden on CAT scan, pancreatic cystic mass. She has a history of rheumatoid arthritis and chronic abnormalities of C1 and C2 with narrowing of the canal, unchanged from previous.  Review of Systems:   Past Medical History  Diagnosis Date  . Hypertension   . Dysrhythmia   . Fall 06/01/11    twice this year  . Incontinence of urine     wears depends  . 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 Psychoses 05/2011    Hallucinations and AMS in setting of low Na, UTI, and steroid use   . Stroke   . Seizures   . Rheumatoid arthritis(714.0)     With known cervical subluxation  . Encephalopathy, unspecified   . Anemia   . Generalized anxiety disorder    Past Surgical History  Procedure Laterality Date  . Toe surgery      great toe bilateral feet due to arthrits   Social History:  reports that she has never smoked. She has never used smokeless tobacco. She reports that she does not drink alcohol or use illicit drugs.   Allergies  Allergen Reactions  . Colchicine Anxiety  . Famotidine Anxiety  . Penicillins Anxiety     Prior to Admission medications   Medication Sig Start Date End Date Taking? Authorizing Provider   acetaminophen (TYLENOL) 500 MG tablet Take 500 mg by mouth every 6 (six) hours as needed for pain.   Yes Historical Provider, MD  alum & mag hydroxide-simeth (MAALOX/MYLANTA) 200-200-20 MG/5ML suspension Take 30 mLs by mouth every 6 (six) hours as needed for indigestion.   Yes Historical Provider, MD  diphenhydramine-acetaminophen (TYLENOL PM) 25-500 MG TABS Take 1 tablet by mouth at bedtime as needed (for insomnia).   Yes Historical Provider, MD  guaiFENesin (ROBITUSSIN) 100 MG/5ML SOLN Take 5 mLs by mouth every 6 (six) hours as needed (for cough).   Yes Historical Provider, MD  HYDROcodone-acetaminophen (NORCO) 10-325 MG per tablet Take 1 tablet by mouth every 6 (six) hours as needed for pain.   Yes Historical Provider, MD  levothyroxine (SYNTHROID, LEVOTHROID) 25 MCG tablet Take 25 mcg by mouth daily before breakfast.   Yes Historical Provider, MD  loperamide (IMODIUM) 2 MG capsule Take 2 mg by mouth every 3 (three) hours as needed (with each loose stool).   Yes Historical Provider, MD  magnesium hydroxide (MILK OF MAGNESIA) 400 MG/5ML suspension Take 30 mLs by mouth at bedtime as needed for constipation.   Yes Historical Provider, MD  OLANZapine (ZYPREXA) 10 MG tablet Take 10 mg by mouth daily.   Yes Historical Provider, MD  polyethylene glycol (MIRALAX / GLYCOLAX) packet Take 17 g by mouth every 3 (three) days.   Yes Historical Provider, MD  sodium chloride  1 G tablet Take 1 g by mouth 3 (three) times daily.   Yes Historical Provider, MD  torsemide (DEMADEX) 20 MG tablet Take 20 mg by mouth daily.   Yes Historical Provider, MD   Physical Exam: Filed Vitals:   01/05/13 1734  BP: 169/69  Pulse: 85  Temp: 98.7 F (37.1 C)  TempSrc: Oral  Resp: 16  SpO2: 96%   BP 133/64  Pulse 61  Temp(Src) 97.8 F (36.6 C) (Oral)  Resp 18  Ht 5\' 3"  (1.6 m)  Wt 51.256 kg (113 lb)  BMI 20.02 kg/m2  SpO2 96%  General Appearance:    Alert, cooperative, no distress, oriented to person and place and  situation   Head:    Normocephalic, without obvious abnormality, atraumatic  Eyes:    PERRL, conjunctiva/corneas clear, EOM's intact, fundi    benign, both eyes  Ears:    Normal TM's and external ear canals, both ears  Nose:   Nares normal, septum midline, mucosa normal, no drainage    or sinus tenderness  Throat:   Lips, mucosa, and tongue normal; teeth and gums normal  Neck:   chronic neck stiffness from chronic c spine abnormalities mentioned above     Lungs:     Clear to auscultation bilaterally, respirations unlabored  Chest Wall:    No tenderness   Heart:    Regular rate and rhythm, S1 and S2 normal, no murmur, rub   or gallop  Breast Exam:    No tenderness, masses, or nipple abnormality  Abdomen:     Soft, non-tender, bowel sounds active no masses, no organomegaly  Genitalia:   deferred  Rectal:   deferred  Extremities:   deformities of the hands consistent with rheumatoid arthritis. Contracture of the right arm. Trace edema   Pulses:   2+ and symmetric all extremities  Skin:   Skin color, texture, turgor normal, no rashes or lesions  Lymph nodes:   Cervical, supraclavicular, and axillary nodes normal  Neurologic:   CNII-XII intact, normal strength, sensation and reflexes    throughout    psychiatric: Normal affect. Calm cooperative and pleasant.   Labs on Admission:  Basic Metabolic Panel:  Recent Labs Lab 01/05/13 1846  NA 139  K 3.2*  CL 100  CO2 30  GLUCOSE 107*  BUN 12  CREATININE 0.56  CALCIUM 9.4   Liver Function Tests:  Recent Labs Lab 01/05/13 1846  AST 13  ALT 7  ALKPHOS 96  BILITOT 0.3  PROT 7.4  ALBUMIN 3.1*   No results found for this basename: LIPASE, AMYLASE,  in the last 168 hours No results found for this basename: AMMONIA,  in the last 168 hours CBC:  Recent Labs Lab 01/05/13 1935  WBC 5.0  NEUTROABS 3.0  HGB 10.8*  HCT 32.8*  MCV 86.1  PLT 232   Cardiac Enzymes:  Recent Labs Lab 01/05/13 1846  CKTOTAL 117    BNP  (last 3 results) No results found for this basename: PROBNP,  in the last 8760 hours CBG: No results found for this basename: GLUCAP,  in the last 168 hours  Radiological Exams on Admission: Ct Abdomen Pelvis Wo Contrast  01/05/2013   *RADIOLOGY REPORT*  Clinical Data: Multiple falls, including one today.  Bilateral leg and shoulder pain for the past 3 weeks.  No new pain.  CT ABDOMEN AND PELVIS WITHOUT CONTRAST  Technique:  Multidetector CT imaging of the abdomen and pelvis was performed following the standard protocol without  intravenous contrast.  Comparison: Lumbar spine radiographs obtained earlier today.  Findings: Unremarkable noncontrasted appearance of the liver, spleen, gallbladder, kidneys and urinary bladder.  The adrenal glands are not well visualized.  Cystic area at the junction of the body and tail of the pancreas, measuring 2.6 x 1.9 cm on image number 19.  Prominent stool throughout the colon.  No enlarged lymph nodes. Atheromatous arterial calcifications.  Minimal linear atelectasis or scarring at both lung bases.  Mild dextroconvex lumbar scoliosis.  Approximately 30% T11 superior endplate compression deformity with minimal retropulsion.  No acute fracture lines are visualized. Mild anterior spur formation at that level.  The compression deformity is unchanged since a lateral radiographic view of the chest dated 06/14/2011.  IMPRESSION:  1.  2.6 x 1.9 cm cystic neoplasm or pseudocyst in the pancreas at the junction of the body and tail of the pancreas.  This could be better evaluated with an elective pre and postcontrast magnetic resonance imaging examination of the pancreas. 2.  Old T11 compression fracture. 3.  No acute abnormality. 4.  Large amount of stool throughout the colon.   Original Report Authenticated By: Beckie Salts, M.D.   Dg Chest 2 View  01/05/2013   *RADIOLOGY REPORT*  Clinical Data: Fall  CHEST - 2 VIEW  Comparison: 11/30/2011  Findings: Cardiac and mediastinal  contours are normal.  Lungs are clear without infiltrate or effusion.  Negative for heart failure. No fracture is identified.  IMPRESSION: No acute abnormality.   Original Report Authenticated By: Janeece Riggers, M.D.   Dg Lumbar Spine Complete  01/05/2013   *RADIOLOGY REPORT*  Clinical Data: Fall.  Back pain with leg pain  LUMBAR SPINE - COMPLETE 4+ VIEW  Comparison: None  Findings: Negative for fracture.  Normal alignment.  Disc spaces are normal.  There is mild dextroscoliosis.  Constipation and ileus are noted.  IMPRESSION: Negative for lumbar fracture.   Original Report Authenticated By: Janeece Riggers, M.D.   Dg Pelvis 1-2 Views  01/05/2013   *RADIOLOGY REPORT*  Clinical Data: Fall  PELVIS - 1-2 VIEW  Comparison: 07/27/2010  Findings: Negative for pelvic or hip fracture.  Both hip joints appear normal.  No focal bony lesion.  IMPRESSION: Negative for fracture.   Original Report Authenticated By: Janeece Riggers, M.D.   Ct Head Wo Contrast  01/05/2013   *RADIOLOGY REPORT*  Clinical Data: Unwitnessed fall.  CT HEAD WITHOUT CONTRAST  Technique:  Contiguous axial images were obtained from the base of the skull through the vertex without contrast.  Comparison: 01/04/2013 head CT and cervical spine CT.  Findings: No skull fracture or intracranial hemorrhage.  Atrophy without hydrocephalus.  Small vessel disease type changes without CT evidence of large acute infarct.  No intracranial mass lesion detected on this unenhanced exam.  Superior displacement of the dens.  This is noted on the recent cervical spine CT where prominent separation from the C1 ring is also present. Prominent compression of the upper cervical cord is once again noted.  IMPRESSION: No skull fracture or intracranial hemorrhage.  Superior displacement of the dens.  This is noted on the recent cervical spine CT where prominent separation from the C1 ring is also present. Prominent compression of the upper cervical cord is once again noted.   Original  Report Authenticated By: Lacy Duverney, M.D.   Ct Head Wo Contrast  01/04/2013   *RADIOLOGY REPORT*  Clinical Data:  The patient fell out of a chair striking the left side of the head.  Left scalp contusion.  Head and neck pain.  CT HEAD WITHOUT CONTRAST CT CERVICAL SPINE WITHOUT CONTRAST  Technique:  Multidetector CT imaging of the head and cervical spine was performed following the standard protocol without intravenous contrast.  Multiplanar CT image reconstructions of the cervical spine were also generated.  Comparison:  10/24/2011  CT HEAD  Findings: Diffuse cerebral atrophy.  Mild ventricular dilatation consistent with central atrophy.  Low attenuation changes in the deep white matter consistent with small vessel ischemia.  No mass effect or midline shift.  No abnormal extra-axial fluid collections.  Gray-white matter junctions are distinct.  Basal cisterns are not effaced.  No evidence of acute intracranial hemorrhage.  No depressed skull fractures.  Visualized paranasal sinuses and mastoid air cells are not opacified.  Note that the odontoid process is present in the cisterna magna consistent with basilar invagination.  IMPRESSION: No acute intracranial abnormalities.  Chronic atrophy and small vessel ischemic change.  CT CERVICAL SPINE  Findings: There is chronic abnormal alignment of the C1-2 interspace with posterior subluxation of C2 with respect to C1 resulting in increased space between the anterior arch of C1 and the odontoid process and decreased space between the posterior aspect of the odontoid process and the posterior elements of C1. Canal measures 6.5 mm at its lowest point, similar to previous study. The tip of the odontoid process extends above the level of the clivus. 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 This is stable since the previous study.  The remainder the cervical vertebrae demonstrate normal alignment.  Facet joints are normally aligned.  Degenerative narrowing of the  cervical disc spaces with endplate hypertrophic changes.  No vertebral compression deformities.  No prevertebral soft tissue swelling.  No focal bone lesion or bone destruction.  Bone cortex and trabecular architecture appear intact.  Diffuse bone demineralization.  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  This is stable since the previous study.  No acute displaced fractures are identified.   Original Report Authenticated By: Burman Nieves, M.D.   Ct Cervical Spine Wo Contrast  01/04/2013   *RADIOLOGY REPORT*  Clinical Data:  The patient fell out of a chair striking the left side of the head.  Left scalp contusion.  Head and neck pain.  CT HEAD WITHOUT CONTRAST CT CERVICAL SPINE WITHOUT CONTRAST  Technique:  Multidetector CT imaging of the head and cervical spine was performed following the standard protocol without intravenous contrast.  Multiplanar CT image reconstructions of the cervical spine were also generated.  Comparison:  10/24/2011  CT HEAD  Findings: Diffuse cerebral atrophy.  Mild ventricular dilatation consistent with central atrophy.  Low attenuation changes in the deep white matter consistent with small vessel ischemia.  No mass effect or midline shift.  No abnormal extra-axial fluid collections.  Gray-white matter junctions are distinct.  Basal cisterns are not effaced.  No evidence of acute intracranial hemorrhage.  No depressed skull fractures.  Visualized paranasal sinuses and mastoid air cells are not opacified.  Note that the odontoid process is present in the cisterna magna consistent with basilar invagination.  IMPRESSION: No acute intracranial abnormalities.  Chronic atrophy and small vessel ischemic change.  CT CERVICAL SPINE  Findings: There is chronic abnormal alignment of the C1-2 interspace with posterior subluxation of C2 with respect to C1  resulting in increased space between the anterior arch of C1 and the odontoid process  and decreased space between the posterior aspect of the odontoid process and the posterior elements of C1. Canal measures 6.5 mm at its lowest point, similar to previous study. The tip of the odontoid process extends above the level of the clivus. 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 This is stable since the previous study.  The remainder the cervical vertebrae demonstrate normal alignment.  Facet joints are normally aligned.  Degenerative narrowing of the cervical disc spaces with endplate hypertrophic changes.  No vertebral compression deformities.  No prevertebral soft tissue swelling.  No focal bone lesion or bone destruction.  Bone cortex and trabecular architecture appear intact.  Diffuse bone demineralization.  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  This is stable since the previous study.  No acute displaced fractures are identified.   Original Report Authenticated By: Burman Nieves, M.D.   Dg Knee Complete 4 Views Right  01/04/2013   *RADIOLOGY REPORT*  Clinical Data: Pain after fall tonight.  RIGHT KNEE - COMPLETE 4+ VIEW  Comparison: None.  Findings: Diffuse bone demineralization.  Degenerative changes with narrowed lateral greater than medial compartment and tricompartmental hypertrophic changes.  There is a small right knee effusion without hemarthrosis.  No displaced fractures are demonstrated in the right knee.  No radiopaque soft tissue foreign bodies.  IMPRESSION: Diffuse demineralization and degenerative change in the right knee. Small effusion.  No displaced fractures identified.   Original Report Authenticated By: Burman Nieves, M.D.    EKG: NSR  Assessment/Plan Principal Problem:   UTI (urinary tract infection), uncomplicated: Rocephin. Await  cultures    multiple falls from  Wheelchair: WC bound. Likely related to above    Pancreatic mass: MRCP and CA-19-9    Rheumatoid arthritis(714.0)    Cervical subluxation, chronic:  Seen by [facility] last year. No further workup    Hypokalemia: replete    Anemia: check anemia panel    Rheumatoid arthritis    Hypothyroidism: check TSH    Constipation: laxatives   Code Status: full Family Communication: son at bedside Disposition Plan: SNF  Time spent: 60 minutes  Clarion Mooneyhan L [facility] Pager [phone] If 7PM-7AM, please contact night-coverage [email or web address] Password [facility] 01/05/2013, 9:03 PM

## 2013-01-06 DIAGNOSIS — D649 Anemia, unspecified: Secondary | ICD-10-CM

## 2013-01-06 LAB — BASIC METABOLIC PANEL
BUN: 10 mg/dL (ref 6–23)
Calcium: 9.6 mg/dL (ref 8.4–10.5)
Creatinine, Ser: 0.54 mg/dL (ref 0.50–1.10)
GFR calc non Af Amer: 87 mL/min — ABNORMAL LOW (ref >90)
Glucose, Bld: 101 mg/dL — ABNORMAL HIGH (ref 70–99)

## 2013-01-06 LAB — HEMOGLOBIN AND HEMATOCRIT, BLOOD: Hemoglobin: 11.4 g/dL — ABNORMAL LOW (ref 12.0–15.0)

## 2013-01-06 LAB — CANCER ANTIGEN 19-9: CA 19-9: 16.7 U/mL — ABNORMAL LOW (ref <35.0)

## 2013-01-06 LAB — RETICULOCYTES
RBC.: 4.04 MIL/uL (ref 3.87–5.11)
Retic Count, Absolute: 36.4 10*3/uL (ref 19.0–186.0)
Retic Ct Pct: 0.9 % (ref 0.4–3.1)

## 2013-01-06 LAB — FOLATE: Folate: 12.7 ng/mL

## 2013-01-06 LAB — VITAMIN B12: Vitamin B-12: 316 pg/mL (ref 211–911)

## 2013-01-06 MED ORDER — BISACODYL 10 MG RE SUPP
10.0000 mg | Freq: Every day | RECTAL | Status: DC
  Administered 2013-01-08 – 2013-01-11 (×3): 10 mg via RECTAL
  Filled 2013-01-06: qty 1

## 2013-01-06 MED ORDER — AMLODIPINE BESYLATE 5 MG PO TABS
5.0000 mg | ORAL_TABLET | Freq: Every day | ORAL | Status: DC
  Administered 2013-01-06 – 2013-01-11 (×6): 5 mg via ORAL
  Filled 2013-01-06 (×6): qty 1

## 2013-01-06 MED ORDER — POLYETHYLENE GLYCOL 3350 17 G PO PACK
17.0000 g | PACK | Freq: Three times a day (TID) | ORAL | Status: DC
  Administered 2013-01-06 (×2): 17 g via ORAL
  Filled 2013-01-06 (×7): qty 1

## 2013-01-06 NOTE — Progress Notes (Signed)
Pt's hands crippled with arthritis, needs assist with eating and ADL'S. Appetite fair. Repositioned q 2 hour, ahd legs elevated. Pt incontinent of stool and urine. Family in visiting.

## 2013-01-06 NOTE — Progress Notes (Signed)
[facility] PROGRESS NOTE  [name] NWG:[id number] DOB: 19-Mar-1930 DOA: 01/05/2013 PCP: Bufford Spikes, DO  Brief narrative: 79 y.o. female with multiple medical comorbidities including but not limited to rhumatoid arthritis, osteoarthritis, multiple falls, hypertension, psychosis from nursing home who was brought to [facility] ED 01/05/2013 status post fall from wheelchair in nursing home. Patient is not good historian due to her dementia. Family is not present at the bedside to give details of medical history. Thre was no reports of fevers or respiratory distress in nursing home. In ED, vitals were stable. CBC was significant for hemoglobin of 10.8 and BMP revealed hypokalemia of 3.2. CXR did not reveal acute cardiopulmonary process. Right knee x ray showed diffuse demineralization and degenerative changes. Lumbar spine and pelvis x ray was negative for fractures. CT head and cervical spine showed no evidence of fractures or acute intracranial findings. CT abdomen was significant for 2.6 x 1.9 cm cystic neoplasm or pseudocyst in the pancreas at the junction of the body and tail of the pancreas; Old T11 compression fracture; constipation.  Assessment and Plan:  Principal Problem:   UTI (urinary tract infection), uncomplicated - continue rocephin - follow up urine culture results Active Problems:   Pancreatic mass - obtain MRI abdomen for further evaluation  - Ca 19-9 is less than the normal range   HYPERTENSION - start Norvasc 5 mg daily for better BP control   Psychosis - continue zyprexa   Hypokalemia - repleted in ED - potassium WNL   Anemia - secondary to chronic disease - Hemoglobin stable at 11.4   Multiple falls from  Wheelchair - PT evaluation   Hypothyroidism - continue synthroid   Constipation - increase miralax to 4 time a day - add bisacodyl suppository daily   Code Status: full code Family Communication: no family at the bedside Disposition Plan: to SNF when  stable  Manson Passey, MD  [facility] Pager [phone]  If 7PM-7AM, please contact night-coverage [email or web address] Password [email or web address] 01/06/2013, 3:25 PM   LOS: 1 day   Consultants:  None   Procedures:  None   Antibiotics:  None   HPI/Subjective: No acute overnight events.  Objective: Filed Vitals:   01/05/13 2230 01/06/13 0440 01/06/13 0718 01/06/13 1400  BP: 133/64   159/71  Pulse: 61 64  73  Temp: 97.8 F (36.6 C) 98.1 F (36.7 C)  98.7 F (37.1 C)  TempSrc:  Oral  Oral  Resp: 18 16  16   Height: 5\' 3"  (1.6 m)     Weight: 51.256 kg (113 lb) 51.438 kg (113 lb 6.4 oz) 53.842 kg (118 lb 11.2 oz)   SpO2: 96% 100%  98%    Intake/Output Summary (Last 24 hours) at 01/06/13 1525 Last data filed at 01/06/13 0941  Gross per 24 hour  Intake 1276.5 ml  Output    100 ml  Net 1176.5 ml    Exam:   General:  Pt is sleeping, not in acute distress  Cardiovascular: irregular rhythm, rate controlled, S1/S2 appreciated  Respiratory: Clear to auscultation bilaterally, no wheezing, no crackles, no rhonchi  Abdomen: Soft, non tender, non distended, bowel sounds present, no guarding  Extremities: trace LE edema, pulses DP and PT palpable bilaterally  Neuro: Grossly nonfocal  Data Reviewed: Basic Metabolic Panel:  Recent Labs Lab 01/05/13 1846 01/06/13 0446  NA 139 138  K 3.2* 3.5  CL 100 101  CO2 30 31  GLUCOSE 107* 101*  BUN 12 10  CREATININE 0.56 0.54  CALCIUM 9.4  9.6  MG 2.0  --    Liver Function Tests:  Recent Labs Lab 01/05/13 1846  AST 13  ALT 7  ALKPHOS 96  BILITOT 0.3  PROT 7.4  ALBUMIN 3.1*    Recent Labs Lab 01/05/13 1846  LIPASE 10*   No results found for this basename: AMMONIA,  in the last 168 hours CBC:  Recent Labs Lab 01/05/13 1935 01/06/13 0446  WBC 5.0  --   NEUTROABS 3.0  --   HGB 10.8* 11.4*  HCT 32.8* 34.7*  MCV 86.1  --   PLT 232  --    Cardiac Enzymes:  Recent Labs Lab 01/05/13 1846  CKTOTAL 117   BNP: No  components found with this basename: POCBNP,  CBG: No results found for this basename: GLUCAP,  in the last 168 hours  No results found for this or any previous visit (from the past 240 hour(s)).   Studies: Ct Abdomen Pelvis Wo Contrast  01/05/2013   *RADIOLOGY REPORT*  Clinical Data: Multiple falls, including one today.  Bilateral leg and shoulder pain for the past 3 weeks.  No new pain.  CT ABDOMEN AND PELVIS WITHOUT CONTRAST  Technique:  Multidetector CT imaging of the abdomen and pelvis was performed following the standard protocol without intravenous contrast.  Comparison: Lumbar spine radiographs obtained earlier today.  Findings: Unremarkable noncontrasted appearance of the liver, spleen, gallbladder, kidneys and urinary bladder.  The adrenal glands are not well visualized.  Cystic area at the junction of the body and tail of the pancreas, measuring 2.6 x 1.9 cm on image number 19.  Prominent stool throughout the colon.  No enlarged lymph nodes. Atheromatous arterial calcifications.  Minimal linear atelectasis or scarring at both lung bases.  Mild dextroconvex lumbar scoliosis.  Approximately 30% T11 superior endplate compression deformity with minimal retropulsion.  No acute fracture lines are visualized. Mild anterior spur formation at that level.  The compression deformity is unchanged since a lateral radiographic view of the chest dated 06/14/2011.  IMPRESSION:  1.  2.6 x 1.9 cm cystic neoplasm or pseudocyst in the pancreas at the junction of the body and tail of the pancreas.  This could be better evaluated with an elective pre and postcontrast magnetic resonance imaging examination of the pancreas. 2.  Old T11 compression fracture. 3.  No acute abnormality. 4.  Large amount of stool throughout the colon.   Original Report Authenticated By: Beckie Salts, M.D.   Dg Chest 2 View  01/05/2013   *RADIOLOGY REPORT*  Clinical Data: Fall  CHEST - 2 VIEW  Comparison: 11/30/2011  Findings: Cardiac and  mediastinal contours are normal.  Lungs are clear without infiltrate or effusion.  Negative for heart failure. No fracture is identified.  IMPRESSION: No acute abnormality.   Original Report Authenticated By: Janeece Riggers, M.D.   Dg Lumbar Spine Complete  01/05/2013   *RADIOLOGY REPORT*  Clinical Data: Fall.  Back pain with leg pain  LUMBAR SPINE - COMPLETE 4+ VIEW  Comparison: None  Findings: Negative for fracture.  Normal alignment.  Disc spaces are normal.  There is mild dextroscoliosis.  Constipation and ileus are noted.  IMPRESSION: Negative for lumbar fracture.   Original Report Authenticated By: Janeece Riggers, M.D.   Dg Pelvis 1-2 Views  01/05/2013   *RADIOLOGY REPORT*  Clinical Data: Fall  PELVIS - 1-2 VIEW  Comparison: 07/27/2010  Findings: Negative for pelvic or hip fracture.  Both hip joints appear normal.  No focal bony lesion.  IMPRESSION: Negative  for fracture.   Original Report Authenticated By: Janeece Riggers, M.D.   Ct Head Wo Contrast  01/05/2013   *RADIOLOGY REPORT*  Clinical Data: Unwitnessed fall.  CT HEAD WITHOUT CONTRAST  Technique:  Contiguous axial images were obtained from the base of the skull through the vertex without contrast.  Comparison: 01/04/2013 head CT and cervical spine CT.  Findings: No skull fracture or intracranial hemorrhage.  Atrophy without hydrocephalus.  Small vessel disease type changes without CT evidence of large acute infarct.  No intracranial mass lesion detected on this unenhanced exam.  Superior displacement of the dens.  This is noted on the recent cervical spine CT where prominent separation from the C1 ring is also present. Prominent compression of the upper cervical cord is once again noted.  IMPRESSION: No skull fracture or intracranial hemorrhage.  Superior displacement of the dens.  This is noted on the recent cervical spine CT where prominent separation from the C1 ring is also present. Prominent compression of the upper cervical cord is once again  noted.   Original Report Authenticated By: Lacy Duverney, M.D.    Scheduled Meds: . amLODipine  5 mg Oral Daily  . cefTRIAXone (ROCEPHIN)  IV  1 g Intravenous Q24H  . enoxaparin (LOVENOX) injection  40 mg Subcutaneous Q24H  . levothyroxine  25 mcg Oral QAC breakfast  . magnesium hydroxide  30 mL Oral QHS  . OLANZapine  10 mg Oral QHS  . polyethylene glycol  17 g Oral Daily  . sodium chloride  1 g Oral TID

## 2013-01-07 DIAGNOSIS — R269 Unspecified abnormalities of gait and mobility: Secondary | ICD-10-CM

## 2013-01-07 MED ORDER — LABETALOL HCL 5 MG/ML IV SOLN
5.0000 mg | INTRAVENOUS | Status: DC | PRN
  Administered 2013-01-07: 5 mg via INTRAVENOUS
  Filled 2013-01-07: qty 4

## 2013-01-07 MED ORDER — POLYETHYLENE GLYCOL 3350 17 G PO PACK
17.0000 g | PACK | Freq: Every day | ORAL | Status: DC
  Administered 2013-01-08: 17 g via ORAL
  Filled 2013-01-07 (×4): qty 1

## 2013-01-07 NOTE — Progress Notes (Addendum)
[facility] PROGRESS NOTE  [name] UEA:[id number] DOB: 02-09-1930 DOA: 01/05/2013 PCP: Bufford Spikes, DO  Brief narrative: 79 y.o. female with multiple medical comorbidities including but not limited to rhumatoid arthritis, osteoarthritis, multiple falls, hypertension, psychosis from nursing home who was brought to [facility] ED 01/05/2013 status post fall from wheelchair in nursing home. Patient is not good historian due to her dementia. Family is not present at the bedside to give details of medical history. Thre was no reports of fevers or respiratory distress in nursing home.  In ED, vitals were stable. CBC was significant for hemoglobin of 10.8 and BMP revealed hypokalemia of 3.2. CXR did not reveal acute cardiopulmonary process. Right knee x ray showed diffuse demineralization and degenerative changes. Lumbar spine and pelvis x ray was negative for fractures. CT head and cervical spine showed no evidence of fractures or acute intracranial findings. CT abdomen was significant for 2.6 x 1.9 cm cystic neoplasm or pseudocyst in the pancreas at the junction of the body and tail of the pancreas; Old T11 compression fracture; constipation.   Assessment and Plan:   Principal Problem:  UTI (urinary tract infection), uncomplicated  - Patient has received 3 days of IV Rocephin. We can discontinue antibiotics at this time. - Urine culture positive for Enterobacter sensitive to Rocephin Active Problems:  Pancreatic mass  - obtain MRI abdomen for further evaluation - hasn't been done yet - Ca 19-9 is less than the normal range  - Appreciate GI consult HYPERTENSION  - Continue Norvasc 5 mg daily - Blood pressure 143/67 Psychosis  - continue zyprexa  Hypokalemia  - repleted in ED - potassium WNL  Anemia  - secondary to chronic disease  - Hemoglobin stable at 11.4  Multiple falls from Wheelchair  - PT evaluation - will follow up on recommendations Hypothyroidism  - continue synthroid   Constipation  - Patient had 4 bowel movements in past 24 hours. We'll discontinue current medications for bowel regimen.  Code Status: full code  Family Communication: no family at the bedside  Disposition Plan: to SNF when stable   Manson Passey, MD  [facility]  Pager [phone]   Consultants:  Gastroenterology  Procedures:  None  Antibiotics:  Rocephin 01/05/2013 until 01/07/2013   If 7PM-7AM, please contact night-coverage [email or web address] Password [facility] 01/07/2013, 2:44 PM   LOS: 2 days    HPI/Subjective: No acute overnight events.  Objective: Filed Vitals:   01/06/13 1956 01/07/13 0420 01/07/13 0613 01/07/13 1004  BP: 153/73 179/99 143/67 143/67  Pulse: 80 88    Temp: 98.4 F (36.9 C) 98.8 F (37.1 C)    TempSrc: Oral Oral    Resp: 15 16    Height:      Weight:      SpO2: 94% 97%      Intake/Output Summary (Last 24 hours) at 01/07/13 1444 Last data filed at 01/07/13 0915  Gross per 24 hour  Intake   2590 ml  Output      0 ml  Net   2590 ml    Exam:   General:  Pt is alert, follows commands appropriately, not in acute distress  Cardiovascular: Regular rate and rhythm, S1/S2, no murmurs, no rubs, no gallops  Respiratory: Clear to auscultation bilaterally, no wheezing, no crackles, no rhonchi  Abdomen: Soft, non tender, non distended, bowel sounds present, no guarding  Extremities: trace edema, pulses DP and PT palpable bilaterally  Neuro: Grossly nonfocal  Data Reviewed: Basic Metabolic Panel:  Recent Labs Lab  01/05/13 1846 01/06/13 0446  NA 139 138  K 3.2* 3.5  CL 100 101  CO2 30 31  GLUCOSE 107* 101*  BUN 12 10  CREATININE 0.56 0.54  CALCIUM 9.4 9.6  MG 2.0  --    Liver Function Tests:  Recent Labs Lab 01/05/13 1846  AST 13  ALT 7  ALKPHOS 96  BILITOT 0.3  PROT 7.4  ALBUMIN 3.1*    Recent Labs Lab 01/05/13 1846  LIPASE 10*   No results found for this basename: AMMONIA,  in the last 168 hours CBC:  Recent Labs Lab  01/05/13 1935 01/06/13 0446  WBC 5.0  --   NEUTROABS 3.0  --   HGB 10.8* 11.4*  HCT 32.8* 34.7*  MCV 86.1  --   PLT 232  --    Cardiac Enzymes:  Recent Labs Lab 01/05/13 1846  CKTOTAL 117   BNP: No components found with this basename: POCBNP,  CBG: No results found for this basename: GLUCAP,  in the last 168 hours  Recent Results (from the past 240 hour(s))  URINE CULTURE     Status: None   Collection Time    01/05/13  8:01 PM      Result Value Range Status   Specimen Description URINE, CATHETERIZED   Final   Special Requests NONE   Final   Culture  Setup Time 01/06/2013 04:19   Final   Colony Count >=100,000 COLONIES/ML   Final   Culture GRAM NEGATIVE RODS   Final   Report Status PENDING   Incomplete     Studies: Ct Abdomen Pelvis Wo Contrast  01/05/2013   *RADIOLOGY REPORT*  Clinical Data: Multiple falls, including one today.  Bilateral leg and shoulder pain for the past 3 weeks.  No new pain.  CT ABDOMEN AND PELVIS WITHOUT CONTRAST  Technique:  Multidetector CT imaging of the abdomen and pelvis was performed following the standard protocol without intravenous contrast.  Comparison: Lumbar spine radiographs obtained earlier today.  Findings: Unremarkable noncontrasted appearance of the liver, spleen, gallbladder, kidneys and urinary bladder.  The adrenal glands are not well visualized.  Cystic area at the junction of the body and tail of the pancreas, measuring 2.6 x 1.9 cm on image number 19.  Prominent stool throughout the colon.  No enlarged lymph nodes. Atheromatous arterial calcifications.  Minimal linear atelectasis or scarring at both lung bases.  Mild dextroconvex lumbar scoliosis.  Approximately 30% T11 superior endplate compression deformity with minimal retropulsion.  No acute fracture lines are visualized. Mild anterior spur formation at that level.  The compression deformity is unchanged since a lateral radiographic view of the chest dated 06/14/2011.  IMPRESSION:   1.  2.6 x 1.9 cm cystic neoplasm or pseudocyst in the pancreas at the junction of the body and tail of the pancreas.  This could be better evaluated with an elective pre and postcontrast magnetic resonance imaging examination of the pancreas. 2.  Old T11 compression fracture. 3.  No acute abnormality. 4.  Large amount of stool throughout the colon.   Original Report Authenticated By: Beckie Salts, M.D.   Dg Chest 2 View  01/05/2013   *RADIOLOGY REPORT*  Clinical Data: Fall  CHEST - 2 VIEW  Comparison: 11/30/2011  Findings: Cardiac and mediastinal contours are normal.  Lungs are clear without infiltrate or effusion.  Negative for heart failure. No fracture is identified.  IMPRESSION: No acute abnormality.   Original Report Authenticated By: Janeece Riggers, M.D.   Dg Lumbar  Spine Complete  01/05/2013   *RADIOLOGY REPORT*  Clinical Data: Fall.  Back pain with leg pain  LUMBAR SPINE - COMPLETE 4+ VIEW  Comparison: None  Findings: Negative for fracture.  Normal alignment.  Disc spaces are normal.  There is mild dextroscoliosis.  Constipation and ileus are noted.  IMPRESSION: Negative for lumbar fracture.   Original Report Authenticated By: Janeece Riggers, M.D.   Dg Pelvis 1-2 Views  01/05/2013   *RADIOLOGY REPORT*  Clinical Data: Fall  PELVIS - 1-2 VIEW  Comparison: 07/27/2010  Findings: Negative for pelvic or hip fracture.  Both hip joints appear normal.  No focal bony lesion.  IMPRESSION: Negative for fracture.   Original Report Authenticated By: Janeece Riggers, M.D.   Ct Head Wo Contrast  01/05/2013   *RADIOLOGY REPORT*  Clinical Data: Unwitnessed fall.  CT HEAD WITHOUT CONTRAST  Technique:  Contiguous axial images were obtained from the base of the skull through the vertex without contrast.  Comparison: 01/04/2013 head CT and cervical spine CT.  Findings: No skull fracture or intracranial hemorrhage.  Atrophy without hydrocephalus.  Small vessel disease type changes without CT evidence of large acute infarct.  No  intracranial mass lesion detected on this unenhanced exam.  Superior displacement of the dens.  This is noted on the recent cervical spine CT where prominent separation from the C1 ring is also present. Prominent compression of the upper cervical cord is once again noted.  IMPRESSION: No skull fracture or intracranial hemorrhage.  Superior displacement of the dens.  This is noted on the recent cervical spine CT where prominent separation from the C1 ring is also present. Prominent compression of the upper cervical cord is once again noted.   Original Report Authenticated By: Lacy Duverney, M.D.    Scheduled Meds: . amLODipine  5 mg Oral Daily  . bisacodyl  10 mg Rectal Daily  . enoxaparin (LOVENOX)   40 mg Subcutaneous Q24H  . levothyroxine  25 mcg Oral QAC breakfast  . magnesium hydroxide  30 mL Oral QHS  . OLANZapine  10 mg Oral QHS  .  polyethylene glycol  17 g Oral Daily  . sodium chloride  1 g Oral TID

## 2013-01-07 NOTE — Evaluation (Signed)
Physical Therapy Evaluation Patient Details Name: [name] MRN: [id number] DOB: [birth year] Today's Date: 01/07/2013 Time: 7829-5621 PT Time Calculation (min): 31 min  PT Assessment / Plan / Recommendation History of Present Illness  [name] is a 79 y.o. female who has fallen out of her wheelchair several times this week. She was evaluated in the emergency room yesterday and released. She fell again today. She reports her appetite has been poor for about 3 weeks. She comes from nursing home. She is wheelchair-bound. She is chronically constipated. She had mild nausea earlier in the week, but no vomiting. No abdominal pain. No dysuria. No fevers or chills. She complains of back pain. She has had multiple scans and x-rays. Workup is significant for urinary tract infection, large stool burden on CAT scan, pancreatic cystic mass. 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 SNF/ALF level  Clinical Impression  Pt minimally able to assist with functional mobility and requires total assistance.. Pt is rigid in extremeties, noted multi joint deformities of arms/ legs . Pt from ALF. Pt may require SNF level unless facility can provide care if at ALF. Pt will benefit from PT while in acute care.  PT Assessment  Patient needs continued PT services    Follow Up Recommendations  SNF    Does the patient have the potential to tolerate intense rehabilitation      Barriers to Discharge        Equipment Recommendations  None recommended by PT    Recommendations for Other Services     Frequency Min 3X/week    Precautions / Restrictions Precautions Precautions: Fall Precaution Comments: HTN   Pertinent Vitals/Pain 148/73      Mobility  Bed Mobility Bed Mobility: Rolling Right;Rolling Left;Supine to Sit;Sit to  Supine;Sitting - Scoot to Edge of Bed Rolling Right: 2: Max assist;With rail Rolling Left: 2: Max assist;With rail Supine to Sit: HOB elevated;1: +1 Total assist Sitting - Scoot to Edge of Bed: 1: +1 Total assist Sit to Supine: 1: +1 Total assist Details for Bed Mobility Assistance: pt die assist with rolling, easier to L than R. Transfers Transfers: Not assessed    Exercises     PT Diagnosis: Difficulty walking;Generalized weakness;Acute pain  PT Problem List: Decreased strength;Decreased range of motion;Decreased activity tolerance;Decreased balance;Decreased mobility;Decreased cognition;Decreased knowledge of use of DME;Decreased safety awareness;Decreased knowledge of precautions;Pain PT Treatment Interventions: DME instruction;Gait training;Functional mobility training;Therapeutic activities;Patient/family education     PT Goals(Current goals can be found in the care plan section) Acute Rehab PT Goals Patient Stated Goal: I want to lie down and rest. PT Goal Formulation: Patient unable to participate in goal setting Time For Goal Achievement: 01/21/13 Potential to Achieve Goals: Fair  Visit Information  Last PT Received On: 01/07/13 Assistance Needed: +2 History of Present Illness: [name] is a 79 y.o. female who has fallen out of her wheelchair several times this week. She was evaluated in the emergency room yesterday and released. She fell again today. She reports her appetite has been poor for about 3 weeks. She comes from nursing home. She is wheelchair-bound. She is chronically constipated. She had mild nausea earlier in the week, but no vomiting. No abdominal pain. No dysuria. No fevers or chills. She complains of back pain. She has had multiple scans and x-rays. Workup is significant  for urinary tract infection, large stool burden on CAT scan, pancreatic cystic mass. She has a history of rheumatoid arthritis and chronic abnormalities of C1 and C2 with narrowing of  the canal, unchanged from previous.Pt has also had incresaed BP.Per chart , pt resides at "[facility]" ? SNF/ALF level       Prior Functioning  Home Living Family/patient expects to be discharged to:: Assisted living Additional Comments: Pt reports that she has required no assistance for ADL"s. No family present, ? reliability. Communication Communication: No difficulties    Cognition  Cognition Arousal/Alertness: Awake/alert (keeps eyes closed.) Behavior During Therapy: Flat affect Overall Cognitive Status: Difficult to assess Area of Impairment: Orientation (pt was oriented to date, ) Orientation Level: Place (knew she had fallen from [facility])    Extremity/Trunk Assessment Upper Extremity Assessment Upper Extremity Assessment: Defer to OT evaluation Lower Extremity Assessment Lower Extremity Assessment: RLE deficits/detail;LLE deficits/detail;Generalized weakness RLE Deficits / Details: able to lift leg from bed and extend knee LLE Deficits / Details: unable to lift from bed. extends knee in sitting.   Balance Balance Balance Assessed: Yes Static Sitting Balance Static Sitting - Balance Support: Bilateral upper extremity supported Static Sitting - Level of Assistance: 3: Mod assist;4: Min assist;2: Max assist Static Sitting - Comment/# of Minutes: initially listing to the R, gradually maintained static balance with min assist.  Dynamic Sitting Balance Dynamic Sitting - Level of Assistance: 2: Max assist Dynamic Sitting - Comments: Pt did reach down toward R leg with R hand to upper leg below knee.  End of Session PT - End of Session Activity Tolerance: Patient tolerated treatment well Patient left: in bed;with call bell/phone within reach;with bed alarm set Nurse Communication: Mobility status;Need for lift equipment  GP     Rada Hay 01/07/2013, 9:21 AM  Blanchard Kelch PT [phone]

## 2013-01-07 NOTE — Evaluation (Signed)
Occupational Therapy Evaluation Patient Details Name: [name] MRN: [id number] DOB: 11-11-1929 Today's Date: 01/07/2013    OT Assessment / Plan / Recommendation History of present illness [name] is a 79 y.o. female who has fallen out of her wheelchair several times this week. She was evaluated in the emergency room yesterday and released. She fell again today. She reports her appetite has been poor for about 3 weeks. She comes from nursing home. She is wheelchair-bound. She is chronically constipated. She had mild nausea earlier in the week, but no vomiting. No abdominal pain. No dysuria. No fevers or chills. She complains of back pain. She has had multiple scans and x-rays. Workup is significant for urinary tract infection, large stool burden on CAT scan, pancreatic cystic mass. 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 SNF/ALF level   Clinical Impression   Pt presents to OT s/p admission to hospital with falls. Pt with decreased I with ADL activity and will benefit from skilled OT to increase I with ADL activity and return to PLOF    OT Assessment  Patient needs continued OT Services    Follow Up Recommendations  SNF       Equipment Recommendations  None recommended by OT       Frequency  Min 2X/week           ADL  Grooming: Performed;Maximal assistance Where Assessed - Grooming: Supported sitting;Other (comment) (bed level- with HOb elevated) Transfers/Ambulation Related to ADLs: Pt kept asking OT to just "do it for me" regarding grooming.  Needed max encouragement to participate.    OT Diagnosis: Generalized weakness;Acute pain  OT Problem List: Decreased strength;Decreased activity tolerance;Pain OT Treatment Interventions: Self-care/ADL training;Patient/family education   OT Goals(Current  goals can be found in the care plan section) Acute Rehab OT Goals OT Goal Formulation: With patient Time For Goal Achievement: 01/21/13  Visit Information  Last OT Received On: 01/07/13 Assistance Needed: +2 History of Present Illness: [name] is a 79 y.o. female who has fallen out of her wheelchair several times this week. She was evaluated in the emergency room yesterday and released. She fell again today. She reports her appetite has been poor for about 3 weeks. She comes from nursing home. She is wheelchair-bound. She is chronically constipated. She had mild nausea earlier in the week, but no vomiting. No abdominal pain. No dysuria. No fevers or chills. She complains of back pain. She has had multiple scans and x-rays. Workup is significant for urinary tract infection, large stool burden on CAT scan, pancreatic cystic mass. 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 SNF/ALF level       Prior Functioning     Home Living Family/patient expects to be discharged to:: Assisted living Additional Comments: Pt reports she receives A with ADL activity.  Pt states she also gets help eating ( hands very arthritic) Communication Communication: No difficulties         Vision/Perception Vision - History Patient Visual Report: No change from baseline   Cognition  Cognition Arousal/Alertness: Awake/alert Behavior During Therapy: Flat affect Overall Cognitive Status: No family/caregiver present to determine baseline cognitive functioning  Extremity/Trunk Assessment Upper Extremity Assessment Upper Extremity Assessment: RUE deficits/detail;LUE deficits/detail RUE Deficits / Details: BUe with significant Arthritis.  Pt with limited ability to self feed and hold grooming items     Mobility Bed Mobility Bed Mobility:  Rolling Right;Rolling Left Rolling Right: 2: Max assist;With rail Rolling Left: 2: Max assist;With rail           End of Session OT - End of Session Activity Tolerance: Patient limited by fatigue Patient left: in bed;with call bell/phone within reach  GO     [name], [name] 01/07/2013, 2:02 PM

## 2013-01-07 NOTE — Consult Note (Signed)
Unassigned patient.  Reason for Consult: Pancreatic psuedocyst.  Referring Physician: Edmund Hilda, MD  [name] is an 79 y.o. female.  HPI: 79 year old black female, with multiple medical problems listed below, admitted for recurrent falls. Had a CT scan of the abdomen that revealed a 2.6 x 1.9 cm cyst vs neoplasm at the junction of the body and tail of the pancreas. GI is being consulted for further workup. Patient denies having any abdominal pain, abnormal weight loss or changes in bowel habits at the present time. She has a history of chronic constipation. There is no history of melena or hematochezia.   Past Medical History  Diagnosis Date  . Hypertension   . Dysrhythmia   . Fall 06/01/11    twice this year  . Incontinence of urine     wears depends  . 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 Psychoses 05/2011    Hallucinations and AMS in setting of low Na, UTI, and steroid use   . Stroke   . Seizures   . Rheumatoid arthritis(714.0)     With known cervical subluxation  . Encephalopathy, unspecified   . Anemia   . Generalized anxiety disorder    Past Surgical History  Procedure Laterality Date  . Toe surgery      great toe bilateral feet due to arthrits   No family history on file.  Social History:  reports that she has never smoked. She has never used smokeless tobacco. She reports that she does not drink alcohol or use illicit drugs.  Allergies:  Allergies  Allergen Reactions  . Colchicine Anxiety  . Famotidine Anxiety  . Penicillins Anxiety   Medications: I have reviewed the patient's current medications.  Results for orders placed during the hospital encounter of 01/05/13 (from the past 48 hour(s))  COMPREHENSIVE METABOLIC PANEL     Status: Abnormal   Collection Time    01/05/13  6:46 PM      Result Value Range   Sodium 139  135 - 145 mEq/L   Potassium 3.2 (*) 3.5 - 5.1 mEq/L   Chloride 100  96 - 112 mEq/L   CO2 30  19 - 32 mEq/L    Glucose, Bld 107 (*) 70 - 99 mg/dL   BUN 12  6 - 23 mg/dL   Creatinine, Ser 4.09  0.50 - 1.10 mg/dL   Calcium 9.4  8.4 - 81.1 mg/dL   Total Protein 7.4  6.0 - 8.3 g/dL   Albumin 3.1 (*) 3.5 - 5.2 g/dL   AST 13  0 - 37 U/L   ALT 7  0 - 35 U/L   Alkaline Phosphatase 96  39 - 117 U/L   Total Bilirubin 0.3  0.3 - 1.2 mg/dL   GFR calc non Af Amer 86 (*) >90 mL/min   GFR calc Af Amer >90  >90 mL/min   Comment:            The eGFR has been calculated     using the CKD EPI equation.     This calculation has not been     validated in all clinical     situations.     eGFR's persistently     <90 mL/min signify     possible Chronic Kidney Disease.  CK     Status: None   Collection Time    01/05/13  6:46 PM      Result Value Range   Total  CK 117  7 - 177 U/L  MAGNESIUM     Status: None   Collection Time    01/05/13  6:46 PM      Result Value Range   Magnesium 2.0  1.5 - 2.5 mg/dL  LIPASE, BLOOD     Status: Abnormal   Collection Time    01/05/13  6:46 PM      Result Value Range   Lipase 10 (*) 11 - 59 U/L  CBC WITH DIFFERENTIAL     Status: Abnormal   Collection Time    01/05/13  7:35 PM      Result Value Range   WBC 5.0  4.0 - 10.5 K/uL   RBC 3.81 (*) 3.87 - 5.11 MIL/uL   Hemoglobin 10.8 (*) 12.0 - 15.0 g/dL   HCT 82.9 (*) 56.2 - 13.0 %   MCV 86.1  78.0 - 100.0 fL   MCH 28.3  26.0 - 34.0 pg   MCHC 32.9  30.0 - 36.0 g/dL   RDW 86.5  78.4 - 69.6 %   Platelets 232  150 - 400 K/uL   Neutrophils Relative % 60  43 - 77 %   Neutro Abs 3.0  1.7 - 7.7 K/uL   Lymphocytes Relative 24  12 - 46 %   Lymphs Abs 1.2  0.7 - 4.0 K/uL   Monocytes Relative 14 (*) 3 - 12 %   Monocytes Absolute 0.7  0.1 - 1.0 K/uL   Eosinophils Relative 3  0 - 5 %   Eosinophils Absolute 0.1  0.0 - 0.7 K/uL   Basophils Relative 0  0 - 1 %   Basophils Absolute 0.0  0.0 - 0.1 K/uL  PROTIME-INR     Status: None   Collection Time    01/05/13  7:35 PM      Result Value Range   Prothrombin Time 13.4  11.6 - 15.2  seconds   INR 1.04  0.00 - 1.49  URINALYSIS, ROUTINE W REFLEX MICROSCOPIC     Status: Abnormal   Collection Time    01/05/13  8:01 PM      Result Value Range   Color, Urine YELLOW  YELLOW   APPearance CLOUDY (*) CLEAR   Specific Gravity, Urine 1.016  1.005 - 1.030   pH 5.5  5.0 - 8.0   Glucose, UA NEGATIVE  NEGATIVE mg/dL   Hgb urine dipstick SMALL (*) NEGATIVE   Bilirubin Urine NEGATIVE  NEGATIVE   Ketones, ur NEGATIVE  NEGATIVE mg/dL   Protein, ur NEGATIVE  NEGATIVE mg/dL   Urobilinogen, UA 1.0  0.0 - 1.0 mg/dL   Nitrite NEGATIVE  NEGATIVE   Leukocytes, UA LARGE (*) NEGATIVE  URINE MICROSCOPIC-ADD ON     Status: Abnormal   Collection Time    01/05/13  8:01 PM      Result Value Range   WBC, UA TOO NUMEROUS TO COUNT  <3 WBC/hpf   Bacteria, UA MANY (*) RARE  URINE CULTURE     Status: None   Collection Time    01/05/13  8:01 PM      Result Value Range   Specimen Description URINE, CATHETERIZED     Special Requests NONE     Culture  Setup Time 01/06/2013 04:19     Colony Count >=100,000 COLONIES/ML     Culture GRAM NEGATIVE RODS     Report Status PENDING    BASIC METABOLIC PANEL     Status: Abnormal   Collection Time  01/06/13  4:46 AM      Result Value Range   Sodium 138  135 - 145 mEq/L   Potassium 3.5  3.5 - 5.1 mEq/L   Chloride 101  96 - 112 mEq/L   CO2 31  19 - 32 mEq/L   Glucose, Bld 101 (*) 70 - 99 mg/dL   BUN 10  6 - 23 mg/dL   Creatinine, Ser 9.60  0.50 - 1.10 mg/dL   Calcium 9.6  8.4 - 45.4 mg/dL   GFR calc non Af Amer 87 (*) >90 mL/min   GFR calc Af Amer >90  >90 mL/min   Comment:            The eGFR has been calculated     using the CKD EPI equation.     This calculation has not been     validated in all clinical     situations.     eGFR's persistently     <90 mL/min signify     possible Chronic Kidney Disease.  CANCER ANTIGEN 19-9     Status: Abnormal   Collection Time    01/06/13  4:46 AM      Result Value Range   CA 19-9 16.7 (*) <35.0 U/mL   HEMOGLOBIN AND HEMATOCRIT, BLOOD     Status: Abnormal   Collection Time    01/06/13  4:46 AM      Result Value Range   Hemoglobin 11.4 (*) 12.0 - 15.0 g/dL   HCT 09.8 (*) 11.9 - 14.7 %  VITAMIN B12     Status: None   Collection Time    01/06/13  4:46 AM      Result Value Range   Vitamin B-12 316  211 - 911 pg/mL  FOLATE     Status: None   Collection Time    01/06/13  4:46 AM      Result Value Range   Folate 12.7     Comment: (NOTE)     Reference Ranges            Deficient:       0.4 - 3.3 ng/mL            Indeterminate:   3.4 - 5.4 ng/mL            Normal:              > 5.4 ng/mL  IRON AND TIBC     Status: None   Collection Time    01/06/13  4:46 AM      Result Value Range   Iron 56  42 - 135 ug/dL   TIBC 829  562 - 130 ug/dL   Saturation Ratios 21  20 - 55 %   UIBC 211  125 - 400 ug/dL  FERRITIN     Status: None   Collection Time    01/06/13  4:46 AM      Result Value Range   Ferritin 86  10 - 291 ng/mL  RETICULOCYTES     Status: None   Collection Time    01/06/13  4:46 AM      Result Value Range   Retic Ct Pct 0.9  0.4 - 3.1 %   RBC. 4.04  3.87 - 5.11 MIL/uL   Retic Count, Manual 36.4  19.0 - 186.0 K/uL  TSH     Status: None   Collection Time    01/06/13  4:46 AM      Result Value Range  TSH 1.343  0.350 - 4.500 uIU/mL   Ct Abdomen Pelvis Wo Contrast  01/05/2013   *RADIOLOGY REPORT*  Clinical Data: Multiple falls, including one today.  Bilateral leg and shoulder pain for the past 3 weeks.  No new pain.  CT ABDOMEN AND PELVIS WITHOUT CONTRAST  Technique:  Multidetector CT imaging of the abdomen and pelvis was performed following the standard protocol without intravenous contrast.  Comparison: Lumbar spine radiographs obtained earlier today.  Findings: Unremarkable noncontrasted appearance of the liver, spleen, gallbladder, kidneys and urinary bladder.  The adrenal glands are not well visualized.  Cystic area at the junction of the body and tail of the pancreas,  measuring 2.6 x 1.9 cm on image number 19.  Prominent stool throughout the colon.  No enlarged lymph nodes. Atheromatous arterial calcifications.  Minimal linear atelectasis or scarring at both lung bases.  Mild dextroconvex lumbar scoliosis.  Approximately 30% T11 superior endplate compression deformity with minimal retropulsion.  No acute fracture lines are visualized. Mild anterior spur formation at that level.  The compression deformity is unchanged since a lateral radiographic view of the chest dated 06/14/2011.  IMPRESSION:  1.  2.6 x 1.9 cm cystic neoplasm or pseudocyst in the pancreas at the junction of the body and tail of the pancreas.  This could be better evaluated with an elective pre and postcontrast magnetic resonance imaging examination of the pancreas. 2.  Old T11 compression fracture. 3.  No acute abnormality. 4.  Large amount of stool throughout the colon.   Original Report Authenticated By: Beckie Salts, M.D.   Ct Head Wo Contrast  01/05/2013   *RADIOLOGY REPORT*  Clinical Data: Unwitnessed fall.  CT HEAD WITHOUT CONTRAST  Technique:  Contiguous axial images were obtained from the base of the skull through the vertex without contrast.  Comparison: 01/04/2013 head CT and cervical spine CT.  Findings: No skull fracture or intracranial hemorrhage.  Atrophy without hydrocephalus.  Small vessel disease type changes without CT evidence of large acute infarct.  No intracranial mass lesion detected on this unenhanced exam.  Superior displacement of the dens.  This is noted on the recent cervical spine CT where prominent separation from the C1 ring is also present. Prominent compression of the upper cervical cord is once again noted.  IMPRESSION: No skull fracture or intracranial hemorrhage.  Superior displacement of the dens.  This is noted on the recent cervical spine CT where prominent separation from the C1 ring is also present. Prominent compression of the upper cervical cord is once again noted.    Original Report Authenticated By: Lacy Duverney, M.D.   Review of Systems  Constitutional: Positive for malaise/fatigue. Negative for fever, chills, weight loss and diaphoresis.  HENT: Negative.   Eyes: Negative.   Respiratory: Negative for cough, hemoptysis, sputum production, shortness of breath and wheezing.   Cardiovascular: Negative for chest pain, palpitations, orthopnea, claudication, leg swelling and PND.  Gastrointestinal: Positive for constipation. Negative for heartburn, nausea, vomiting, abdominal pain, diarrhea, blood in stool and melena.  Musculoskeletal: Positive for myalgias, back pain, joint pain and falls.  Skin: Negative.   Neurological: Positive for weakness.   Blood pressure 143/67, pulse 88, temperature 98.8 F (37.1 C), temperature source Oral, resp. rate 16, height 5\' 3"  (1.6 m), weight 53.842 kg (118 lb 11.2 oz), SpO2 97.00%. Physical Exam  Constitutional: She is oriented to person, place, and time. She appears well-developed and well-nourished.  HENT:  Head: Normocephalic and atraumatic.  Eyes: Conjunctivae are normal. Pupils are  equal, round, and reactive to light.  Neck: Neck supple.  Cardiovascular: Normal rate and regular rhythm.   Respiratory: Effort normal and breath sounds normal.  GI: Soft. Bowel sounds are normal. She exhibits no distension and no mass. There is no tenderness. There is no rebound and no guarding.  Neurological: She is alert and oriented to person, place, and time.  Skin: Skin is warm and dry.  Psychiatric: Her behavior is normal. Thought content normal.   Assessment/Plan: 1) Pancreatic mass vs psuedocyst with a normal CA 19-9. Will follow up on the results of the MRI and make further recommendations as needed. Patient is a poor historian and therefore we will have to wait and see what the MRI [scheduled for today] shows before a game plan can be formulated.  2) Chronic constipation.  3) Anemia of chronic disease.  4) Rheumatoid  arthritis.  5) Hypertension. 6) Multiple falls from wheelchair.  7) Hypothyroidism.  [name] 01/07/2013, 6:43 PM

## 2013-01-07 NOTE — Progress Notes (Signed)
Pt states not being hungry. Refuses to sit up in chair. States she just wants to stay in bed. Pt repositioned.q 2 hour. Pt  Made NPO for MRI tonight. Pt incontinent. Family at bedside.

## 2013-01-07 NOTE — Progress Notes (Signed)
Clinical Social Work Department BRIEF PSYCHOSOCIAL ASSESSMENT 01/07/2013  Patient:  [name], [name]     Account Number:  [id number]     Admit date:  01/05/2013  Clinical Social Worker:  Jacelyn Grip  Date/Time:  01/07/2013 02:00 PM  Referred by:  Physician  Date Referred:  01/07/2013 Referred for  ALF Placement  SNF Placement   Other Referral:   Interview type:  Patient Other interview type:    PSYCHOSOCIAL DATA Living Status:  FACILITY Admitted from facility:  OTHER Level of care:  Assisted Living Primary support name:  [name]/son Primary support relationship to patient:  CHILD, ADULT Degree of support available:   unknown at this time, no family present    Pt admitted from [facility]    CURRENT CONCERNS Current Concerns  Post-Acute Placement   Other Concerns:    SOCIAL WORK ASSESSMENT / PLAN CSW received referral that pt admitted from [facility].    CSW reviewed chart and noted that pt may need SNF depending on level of care the ALF can provide.    CSW met with pt at bedside to discuss. Pt confirmed that she is a resident at [facility]. CSW inquired with pt if she felt she needed higher level of care at SNF as this may need to be considered. Pt reports that she feels that her needs can continue to be met at [facility] and would like CSW to inquire with facility if they can continue to meet pt needs.    CSW contacted facility and per med tech at ALF, pt is wheelchair bound at baseline. Pt requires a good amount of assist at baseline at ALF. Per med tech, at ALF, CSW would have to speak with Resident Care Coordinator re: if facility could continue to meet pt needs and Resident Care Coordinator was not present at this time. CSW will attempt call back to reach Resident Care Coordinator.    CSW to speak with ALF if facility feels they can continue to meet pt needs as pt preference is to return to [facility] and continue to  assist with pt discharge planning needs from there.   Assessment/plan status:  Psychosocial Support/Ongoing Assessment of Needs Other assessment/ plan:   discharge planning   Information/referral to community resources:   Referral back to [facility]    PATIENT'S/FAMILY'S RESPONSE TO PLAN OF CARE: Pt alert and oriented x 4. Pt expressed that she was currently in pain and RN was notified and it appeared that pt pain limited pt engagement in conversation. CSW to follow up with [facility] re: if facility is able to continue to meet pt care needs.       Jacklynn Lewis, MSW, LCSWA  Clinical Social Work [phone]

## 2013-01-07 NOTE — Progress Notes (Signed)
On-call provider notified of BP 179/99.  PRN orders received.  See MAR for administration.

## 2013-01-07 NOTE — Progress Notes (Signed)
CSW spoke with [facility] Resident Care Coordinator who stated that facility can continue to meet pt needs as pt is a resident in memory care unit and is wheelchair bound at baseline.  Per [facility], pt would benefit from Home Health PT/OT at ALF and facility mainly uses [facility].  CSW to continue to follow to assist with pt return to [facility] when pt medically stable for discharge.   Jacklynn Lewis, MSW, LCSWA  Clinical Social Work [phone]

## 2013-01-08 ENCOUNTER — Inpatient Hospital Stay (HOSPITAL_COMMUNITY): Payer: Medicare PPO

## 2013-01-08 ENCOUNTER — Other Ambulatory Visit (HOSPITAL_COMMUNITY): Payer: Medicare Other

## 2013-01-08 LAB — URINE CULTURE

## 2013-01-08 MED ORDER — GADOBENATE DIMEGLUMINE 529 MG/ML IV SOLN
10.0000 mL | Freq: Once | INTRAVENOUS | Status: AC | PRN
  Administered 2013-01-08: 10 mL via INTRAVENOUS

## 2013-01-08 NOTE — Progress Notes (Addendum)
Subjective: No acute events.  She reports that she cannot get comfortable in her bed.  Objective: Vital signs in last 24 hours: Temp:  [97.3 F (36.3 C)-98.8 F (37.1 C)] 98.8 F (37.1 C) (07/15 1418) Pulse Rate:  [54-80] 80 (07/15 1418) Resp:  [16] 16 (07/15 1418) BP: (131-158)/(51-79) 154/79 mmHg (07/15 1418) SpO2:  [95 %-99 %] 95 % (07/15 1418) Last BM Date: 01/07/13  Intake/Output from previous day: 07/14 0701 - 07/15 0700 In: 320 [P.O.:320] Out: -  Intake/Output this shift: Total I/O In: 600 [P.O.:600] Out: -   General appearance: alert and no distress GI: soft, non-tender; bowel sounds normal; no masses,  no organomegaly  Lab Results:  Recent Labs  01/05/13 1935 01/06/13 0446  WBC 5.0  --   HGB 10.8* 11.4*  HCT 32.8* 34.7*  PLT 232  --    BMET  Recent Labs  01/05/13 1846 01/06/13 0446  NA 139 138  K 3.2* 3.5  CL 100 101  CO2 30 31  GLUCOSE 107* 101*  BUN 12 10  CREATININE 0.56 0.54  CALCIUM 9.4 9.6   LFT  Recent Labs  01/05/13 1846  PROT 7.4  ALBUMIN 3.1*  AST 13  ALT 7  ALKPHOS 96  BILITOT 0.3   PT/INR  Recent Labs  01/05/13 1935  LABPROT 13.4  INR 1.04   Hepatitis Panel No results found for this basename: HEPBSAG, HCVAB, HEPAIGM, HEPBIGM,  in the last 72 hours C-Diff No results found for this basename: CDIFFTOX,  in the last 72 hours Fecal Lactopherrin No results found for this basename: FECLLACTOFRN,  in the last 72 hours  Studies/Results: Mr 3d Recon At Scanner  01/08/2013   *RADIOLOGY REPORT*  Clinical Data:  Multiple falls.  Cystic pancreatic mass on noncontrast abdominal CT.  MRI ABDOMEN WITHOUT AND WITH CONTRAST (INCLUDING MRCP)  Technique:  Multiplanar multisequence MR imaging of the abdomen was performed both before and after the administration of intravenous contrast. Heavily T2-weighted images of the biliary and pancreatic ducts were obtained, and three-dimensional MRCP images were rendered by post processing.   Contrast: 10mL MULTIHANCE GADOBENATE DIMEGLUMINE 529 MG/ML IV SOLN  Comparison:  Noncontrast abdominal CT 01/05/2013.  Findings:  The study is moderately degraded by motion on this inpatient who had difficulty suspending respiration especially towards the end of the study which includes the post contrast images.  As demonstrated on CT, there is a well-circumscribed cystic lesion within the pancreatic tail, measuring 2.5 cm in diameter.  This demonstrates T2 hyperintensity and a dependent 4 mm nodular component posteriorly.  Postcontrast, no significant enhancement of the walls of this cystic lesion or the dependent nodule are identified.  Upstream from the cystic lesion, there is mild dilatation of the pancreatic duct in the pancreatic tail.  The pancreatic duct within the pancreatic body and head is normal in caliber.  The cystic lesion may communicate with the pancreatic duct.  There is no intra or extrahepatic biliary dilatation.  There is a probable small gallstone dependently within the gallbladder lumen.  No focal abnormalities of the liver, spleen, adrenal glands or kidneys are demonstrated.  There is mild atelectasis at both lung bases.  Moderate stool is present throughout the colon.  There are no enlarged lymph nodes within the upper abdomen.  Aortic atherosclerosis is noted.  IMPRESSION:  1.  Well-circumscribed cystic lesion within the pancreatic tail may communicate with the pancreatic duct and/or extrinsically compress it, resulting in mild dilatation of the duct within the pancreatic tail.    The cystic lesion demonstrates no solid components or suspicious enhancement.  This could reflect postinflammatory pseudocyst or a low grade cystic neoplasm (intraductal mucinous papillary neoplasm or serous cystadenoma). 2.  Probable small gallstone.  No biliary dilatation. 3.  No other significant findings.  Given the patient's age of 82 years, imaging follow-up of the pancreatic lesion in 6 months is suggested.   That could be performed with ultrasound, CT or MRI as the primary role is to assess size stability. This recommendation follows ACR consensus guidelines:  Managing Incidental Findings on Abdominal CT:  White Paper of the ACR Incidental Findings Committee.  J Am Coll Radiol 2010;7:754-773   Original Report Authenticated By: William Veazey, M.D.   Mr Abd W/wo Cm/mrcp  01/08/2013   *RADIOLOGY REPORT*  Clinical Data:  Multiple falls.  Cystic pancreatic mass on noncontrast abdominal CT.  MRI ABDOMEN WITHOUT AND WITH CONTRAST (INCLUDING MRCP)  Technique:  Multiplanar multisequence MR imaging of the abdomen was performed both before and after the administration of intravenous contrast. Heavily T2-weighted images of the biliary and pancreatic ducts were obtained, and three-dimensional MRCP images were rendered by post processing.  Contrast: 10mL MULTIHANCE GADOBENATE DIMEGLUMINE 529 MG/ML IV SOLN  Comparison:  Noncontrast abdominal CT 01/05/2013.  Findings:  The study is moderately degraded by motion on this inpatient who had difficulty suspending respiration especially towards the end of the study which includes the post contrast images.  As demonstrated on CT, there is a well-circumscribed cystic lesion within the pancreatic tail, measuring 2.5 cm in diameter.  This demonstrates T2 hyperintensity and a dependent 4 mm nodular component posteriorly.  Postcontrast, no significant enhancement of the walls of this cystic lesion or the dependent nodule are identified.  Upstream from the cystic lesion, there is mild dilatation of the pancreatic duct in the pancreatic tail.  The pancreatic duct within the pancreatic body and head is normal in caliber.  The cystic lesion may communicate with the pancreatic duct.  There is no intra or extrahepatic biliary dilatation.  There is a probable small gallstone dependently within the gallbladder lumen.  No focal abnormalities of the liver, spleen, adrenal glands or kidneys are  demonstrated.  There is mild atelectasis at both lung bases.  Moderate stool is present throughout the colon.  There are no enlarged lymph nodes within the upper abdomen.  Aortic atherosclerosis is noted.  IMPRESSION:  1.  Well-circumscribed cystic lesion within the pancreatic tail may communicate with the pancreatic duct and/or extrinsically compress it, resulting in mild dilatation of the duct within the pancreatic tail.  The cystic lesion demonstrates no solid components or suspicious enhancement.  This could reflect postinflammatory pseudocyst or a low grade cystic neoplasm (intraductal mucinous papillary neoplasm or serous cystadenoma). 2.  Probable small gallstone.  No biliary dilatation. 3.  No other significant findings.  Given the patient's age of 82 years, imaging follow-up of the pancreatic lesion in 6 months is suggested.  That could be performed with ultrasound, CT or MRI as the primary role is to assess size stability. This recommendation follows ACR consensus guidelines:  Managing Incidental Findings on Abdominal CT:  White Paper of the ACR Incidental Findings Committee.  J Am Coll Radiol 2010;7:754-773   Original Report Authenticated By: William Veazey, M.D.    Medications:  Scheduled: . amLODipine  5 mg Oral Daily  . bisacodyl  10 mg Rectal Daily  . levothyroxine  25 mcg Oral QAC breakfast  . magnesium hydroxide  30 mL Oral QHS  .   OLANZapine  10 mg Oral QHS  . polyethylene glycol  17 g Oral Daily  . sodium chloride  1 g Oral TID   Continuous:   Assessment/Plan: 1) Pancreatic cyst 2.5 cm.   This an incidental finding.  Since mobility is a problem for her I think it is prudent to evaluate the cyst with an EUS/FNA.  There is the possibility of communication with the main PD, which suggests an IPMN.    Plan: 1) EUS with FNA tomorrow.   LOS: 3 days   May Ozment D 01/08/2013, 3:50 PM  I attempted to call her son Marvin Siddle at [phone], but there was no answer. 

## 2013-01-08 NOTE — Progress Notes (Signed)
Per MD, pt not yet medically ready for discharge.  CSW contacted [facility] to update as facility feels they can accept pt back when pt is medically ready for discharge.  CSW to continue to follow and facilitate pt discharge needs when pt medically ready.  Jacklynn Lewis, MSW, LCSWA  Clinical Social Work [phone]

## 2013-01-08 NOTE — Progress Notes (Signed)
[facility] PROGRESS NOTE  [name] LKG:[id number] DOB: 07-13-1929 DOA: 01/05/2013 PCP: Bufford Spikes, DO  Brief narrative: 79 y.o. female with multiple medical comorbidities including but not limited to rhumatoid arthritis, osteoarthritis, multiple falls, hypertension, psychosis from nursing home who was brought to [facility] ED 01/05/2013 status post fall from wheelchair in nursing home. Patient is not good historian due to her dementia. Family is not present at the bedside to give details of medical history. Thre was no reports of fevers or respiratory distress in nursing home.  In ED, vitals were stable. CBC was significant for hemoglobin of 10.8 and BMP revealed hypokalemia of 3.2. CXR did not reveal acute cardiopulmonary process. Right knee x ray showed diffuse demineralization and degenerative changes. Lumbar spine and pelvis x ray was negative for fractures. CT head and cervical spine showed no evidence of fractures or acute intracranial findings. CT abdomen was significant for 2.6 x 1.9 cm cystic neoplasm or pseudocyst in the pancreas at the junction of the body and tail of the pancreas; Old T11 compression fracture; constipation. MRI was done and showed cystic lesion within the pancreatic tail resulting in mild dilatation of the duct within the pancreatic tail. This  cystic lesion could likely represent a pseudocyst or low grade cystic neoplasm.  Assessment and Plan:   Principal Problem:  UTI (urinary tract infection), uncomplicated  - Patient has received 3 days of IV Rocephin. We can discontinue antibiotics at this time.  - Urine culture positive for Enterobacter sensitive to Rocephin  Active Problems:  Pancreatic mass  - MRI abdomen showed cystic lesion within the pancreatic tail resulting in ductal dilatation within the pancreatic tail which could represent pseudocyst or low grade cystic neoplasm - Will followup with GI on further recommendations - Ca 19-9 is less than the  normal range  HYPERTENSION  - Continue Norvasc 5 mg daily  Psychosis  - continue zyprexa  Hypokalemia  - repleted in ED - potassium WNL  Anemia  - secondary to chronic disease  - Hemoglobin stable at 11.4  Multiple falls from Wheelchair  - PT evaluation - will return to skilled nursing facility when stable, memory unit Hypothyroidism  - continue synthroid  Constipation  - has regular bowel movements   Code Status: full code  Family Communication: no family at the bedside  Disposition Plan: to SNF when stable   Manson Passey, MD  [facility]  Pager [phone]   Consultants:  Gastroenterology (Dr. Loreta Ave) Procedures:  None  Antibiotics:  Rocephin 01/05/2013 until 01/07/2013   If 7PM-7AM, please contact night-coverage [email or web address] Password [facility] 01/08/2013, 10:53 AM   LOS: 3 days    HPI/Subjective: No acute overnight events.  Objective: Filed Vitals:   01/07/13 1004 01/07/13 1500 01/07/13 2120 01/08/13 0542  BP: 143/67 140/60 131/51 158/72  Pulse:  80 54 78  Temp:  98.6 F (37 C) 98.5 F (36.9 C) 97.3 F (36.3 C)  TempSrc:  Oral Axillary Oral  Resp:  16 16 16   Height:      Weight:      SpO2:  97% 96% 99%    Intake/Output Summary (Last 24 hours) at 01/08/13 1053 Last data filed at 01/08/13 0930  Gross per 24 hour  Intake    360 ml  Output      0 ml  Net    360 ml    Exam:   General:  Pt is alert, follows commands appropriately, not in acute distress  Cardiovascular: Regular rate and rhythm, S1/S2 appreciated  Respiratory: Clear to auscultation bilaterally, no wheezing, no crackles, no rhonchi  Abdomen: Soft, non tender, non distended, bowel sounds present, no guarding  Extremities: trace edema, pulses DP and PT palpable bilaterally  Neuro: Grossly nonfocal  Data Reviewed: Basic Metabolic Panel:  Recent Labs Lab 01/05/13 1846 01/06/13 0446  NA 139 138  K 3.2* 3.5  CL 100 101  CO2 30 31  GLUCOSE 107* 101*  BUN 12 10  CREATININE 0.56 0.54   CALCIUM 9.4 9.6  MG 2.0  --    Liver Function Tests:  Recent Labs Lab 01/05/13 1846  AST 13  ALT 7  ALKPHOS 96  BILITOT 0.3  PROT 7.4  ALBUMIN 3.1*    Recent Labs Lab 01/05/13 1846  LIPASE 10*   No results found for this basename: AMMONIA,  in the last 168 hours CBC:  Recent Labs Lab 01/05/13 1935 01/06/13 0446  WBC 5.0  --   NEUTROABS 3.0  --   HGB 10.8* 11.4*  HCT 32.8* 34.7*  MCV 86.1  --   PLT 232  --    Cardiac Enzymes:  Recent Labs Lab 01/05/13 1846  CKTOTAL 117   BNP: No components found with this basename: POCBNP,  CBG: No results found for this basename: GLUCAP,  in the last 168 hours  Recent Results (from the past 240 hour(s))  URINE CULTURE     Status: None   Collection Time    01/05/13  8:01 PM      Result Value Range Status   Specimen Description URINE, CATHETERIZED   Final   Special Requests NONE   Final   Culture  Setup Time 01/06/2013 04:19   Final   Colony Count >=100,000 COLONIES/ML   Final   Culture ENTEROBACTER CLOACAE   Final   Report Status 01/08/2013 FINAL   Final   Organism ID, Bacteria ENTEROBACTER CLOACAE   Final     Studies: Mr 3d Recon At Scanner 01/08/2013   *  IMPRESSION:  1.  Well-circumscribed cystic lesion within the pancreatic tail may communicate with the pancreatic duct and/or extrinsically compress it, resulting in mild dilatation of the duct within the pancreatic tail.  The cystic lesion demonstrates no solid components or suspicious enhancement.  This could reflect postinflammatory pseudocyst or a low grade cystic neoplasm (intraductal mucinous papillary neoplasm or serous cystadenoma). 2.  Probable small gallstone.  No biliary dilatation. 3.  No other significant findings.  Given the patient's age of 79 years, imaging follow-up of the pancreatic lesion in 6 months is suggested.  That could be performed with ultrasound, CT or MRI as the primary role is to assess size stability. This recommendation follows ACR  consensus guidelines:  Managing Incidental Findings on Abdominal CT:  White Paper of the ACR Incidental Findings Committee.  [facility] Radiol [phone]   Original Report Authenticated By: Carey Bullocks, M.D.   Mr Abd W/wo Cm/mrcp 01/08/2013   * IMPRESSION:  1.  Well-circumscribed cystic lesion within the pancreatic tail may communicate with the pancreatic duct and/or extrinsically compress it, resulting in mild dilatation of the duct within the pancreatic tail.  The cystic lesion demonstrates no solid components or suspicious enhancement.  This could reflect postinflammatory pseudocyst or a low grade cystic neoplasm (intraductal mucinous papillary neoplasm or serous cystadenoma). 2.  Probable small gallstone.  No biliary dilatation. 3.  No other significant findings.  Given the patient's age of 67 years, imaging follow-up of the pancreatic lesion in 6 months is suggested.  That could be performed with ultrasound, CT or MRI as the primary role is to assess size stability. This recommendation follows ACR consensus guidelines:  Managing Incidental Findings on Abdominal CT:  White Paper of the ACR Incidental Findings Committee.  [facility] Radiol [phone]   Original Report Authenticated By: Carey Bullocks, M.D.    Scheduled Meds: . amLODipine  5 mg Oral Daily  . bisacodyl  10 mg Rectal Daily  . enoxaparin (LOVENOX) injection  40 mg Subcutaneous Q24H  . levothyroxine  25 mcg Oral QAC breakfast  . magnesium hydroxide  30 mL Oral QHS  . OLANZapine  10 mg Oral QHS  . polyethylene glycol  17 g Oral Daily

## 2013-01-09 ENCOUNTER — Encounter (HOSPITAL_COMMUNITY): Payer: Self-pay | Admitting: Anesthesiology

## 2013-01-09 ENCOUNTER — Encounter (HOSPITAL_COMMUNITY): Payer: Self-pay | Admitting: *Deleted

## 2013-01-09 ENCOUNTER — Encounter (HOSPITAL_COMMUNITY)
Admission: EM | Disposition: A | Discharge status: Home Health | Payer: Self-pay | Source: Home / Self Care | Attending: Internal Medicine

## 2013-01-09 ENCOUNTER — Inpatient Hospital Stay (HOSPITAL_COMMUNITY): Payer: Medicare PPO | Admitting: Anesthesiology

## 2013-01-09 HISTORY — PX: EUS: SHX5427

## 2013-01-09 HISTORY — PX: FINE NEEDLE ASPIRATION: SHX5430

## 2013-01-09 SURGERY — UPPER ENDOSCOPIC ULTRASOUND (EUS) LINEAR
Anesthesia: Monitor Anesthesia Care

## 2013-01-09 MED ORDER — KETAMINE HCL 50 MG/ML IJ SOLN
INTRAMUSCULAR | Status: DC | PRN
  Administered 2013-01-09: 25 mg via INTRAMUSCULAR

## 2013-01-09 MED ORDER — LACTATED RINGERS IV SOLN
INTRAVENOUS | Status: DC
  Administered 2013-01-09: 1000 mL via INTRAVENOUS

## 2013-01-09 MED ORDER — CIPROFLOXACIN HCL 500 MG PO TABS
500.0000 mg | ORAL_TABLET | Freq: Two times a day (BID) | ORAL | Status: DC
  Administered 2013-01-09 – 2013-01-11 (×4): 500 mg via ORAL
  Filled 2013-01-09 (×6): qty 1

## 2013-01-09 MED ORDER — ESMOLOL HCL 10 MG/ML IV SOLN
INTRAVENOUS | Status: DC | PRN
  Administered 2013-01-09: 10 mg via INTRAVENOUS

## 2013-01-09 MED ORDER — MIDAZOLAM HCL 5 MG/5ML IJ SOLN
INTRAMUSCULAR | Status: DC | PRN
  Administered 2013-01-09 (×2): 1 mg via INTRAVENOUS

## 2013-01-09 MED ORDER — PROPOFOL INFUSION 10 MG/ML OPTIME
INTRAVENOUS | Status: DC | PRN
  Administered 2013-01-09: 75 ug/kg/min via INTRAVENOUS

## 2013-01-09 MED ORDER — SODIUM CHLORIDE 0.9 % IV SOLN: INTRAVENOUS | Status: DC

## 2013-01-09 MED ORDER — KETAMINE HCL 10 MG/ML IJ SOLN
INTRAMUSCULAR | Status: DC | PRN
  Administered 2013-01-09: 10 mg via INTRAVENOUS

## 2013-01-09 NOTE — Preoperative (Signed)
Beta Blockers   Reason not to administer Beta Blockers:Not Applicable 

## 2013-01-09 NOTE — Care Management Note (Signed)
Pt from [facility]. PT recommendations for SNF. CSW to follow. CM to sign off. Will follow if pt requires disposition to ALF with [facility] services.   Roxy Manns Nameer Summer,RN,BSN [phone]

## 2013-01-09 NOTE — H&P (View-Only) (Signed)
Subjective: No acute events.  She reports that she cannot get comfortable in her bed.  Objective: Vital signs in last 24 hours: Temp:  [97.3 F (36.3 C)-98.8 F (37.1 C)] 98.8 F (37.1 C) (07/15 1418) Pulse Rate:  [54-80] 80 (07/15 1418) Resp:  [16] 16 (07/15 1418) BP: (131-158)/(51-79) 154/79 mmHg (07/15 1418) SpO2:  [95 %-99 %] 95 % (07/15 1418) Last BM Date: 01/07/13  Intake/Output from previous day: 07/14 0701 - 07/15 0700 In: 320 [P.O.:320] Out: -  Intake/Output this shift: Total I/O In: 600 [P.O.:600] Out: -   General appearance: alert and no distress GI: soft, non-tender; bowel sounds normal; no masses,  no organomegaly  Lab Results:  Recent Labs  01/05/13 1935 01/06/13 0446  WBC 5.0  --   HGB 10.8* 11.4*  HCT 32.8* 34.7*  PLT 232  --    BMET  Recent Labs  01/05/13 1846 01/06/13 0446  NA 139 138  K 3.2* 3.5  CL 100 101  CO2 30 31  GLUCOSE 107* 101*  BUN 12 10  CREATININE 0.56 0.54  CALCIUM 9.4 9.6   LFT  Recent Labs  01/05/13 1846  PROT 7.4  ALBUMIN 3.1*  AST 13  ALT 7  ALKPHOS 96  BILITOT 0.3   PT/INR  Recent Labs  01/05/13 1935  LABPROT 13.4  INR 1.04   Hepatitis Panel No results found for this basename: HEPBSAG, HCVAB, HEPAIGM, HEPBIGM,  in the last 72 hours C-Diff No results found for this basename: CDIFFTOX,  in the last 72 hours Fecal Lactopherrin No results found for this basename: FECLLACTOFRN,  in the last 72 hours  Studies/Results: Mr 3d Recon At Scanner  01/08/2013   *RADIOLOGY REPORT*  Clinical Data:  Multiple falls.  Cystic pancreatic mass on noncontrast abdominal CT.  MRI ABDOMEN WITHOUT AND WITH CONTRAST (INCLUDING MRCP)  Technique:  Multiplanar multisequence MR imaging of the abdomen was performed both before and after the administration of intravenous contrast. Heavily T2-weighted images of the biliary and pancreatic ducts were obtained, and three-dimensional MRCP images were rendered by post processing.   Contrast: 10mL MULTIHANCE GADOBENATE DIMEGLUMINE 529 MG/ML IV SOLN  Comparison:  Noncontrast abdominal CT 01/05/2013.  Findings:  The study is moderately degraded by motion on this inpatient who had difficulty suspending respiration especially towards the end of the study which includes the post contrast images.  As demonstrated on CT, there is a well-circumscribed cystic lesion within the pancreatic tail, measuring 2.5 cm in diameter.  This demonstrates T2 hyperintensity and a dependent 4 mm nodular component posteriorly.  Postcontrast, no significant enhancement of the walls of this cystic lesion or the dependent nodule are identified.  Upstream from the cystic lesion, there is mild dilatation of the pancreatic duct in the pancreatic tail.  The pancreatic duct within the pancreatic body and head is normal in caliber.  The cystic lesion may communicate with the pancreatic duct.  There is no intra or extrahepatic biliary dilatation.  There is a probable small gallstone dependently within the gallbladder lumen.  No focal abnormalities of the liver, spleen, adrenal glands or kidneys are demonstrated.  There is mild atelectasis at both lung bases.  Moderate stool is present throughout the colon.  There are no enlarged lymph nodes within the upper abdomen.  Aortic atherosclerosis is noted.  IMPRESSION:  1.  Well-circumscribed cystic lesion within the pancreatic tail may communicate with the pancreatic duct and/or extrinsically compress it, resulting in mild dilatation of the duct within the pancreatic tail.  The cystic lesion demonstrates no solid components or suspicious enhancement.  This could reflect postinflammatory pseudocyst or a low grade cystic neoplasm (intraductal mucinous papillary neoplasm or serous cystadenoma). 2.  Probable small gallstone.  No biliary dilatation. 3.  No other significant findings.  Given the patient's age of 69 years, imaging follow-up of the pancreatic lesion in 6 months is suggested.   That could be performed with ultrasound, CT or MRI as the primary role is to assess size stability. This recommendation follows ACR consensus guidelines:  Managing Incidental Findings on Abdominal CT:  White Paper of the ACR Incidental Findings Committee.  [facility] Radiol [phone]   Original Report Authenticated By: Carey Bullocks, M.D.   Mr Abd W/wo Cm/mrcp  01/08/2013   *RADIOLOGY REPORT*  Clinical Data:  Multiple falls.  Cystic pancreatic mass on noncontrast abdominal CT.  MRI ABDOMEN WITHOUT AND WITH CONTRAST (INCLUDING MRCP)  Technique:  Multiplanar multisequence MR imaging of the abdomen was performed both before and after the administration of intravenous contrast. Heavily T2-weighted images of the biliary and pancreatic ducts were obtained, and three-dimensional MRCP images were rendered by post processing.  Contrast: 10mL MULTIHANCE GADOBENATE DIMEGLUMINE 529 MG/ML IV SOLN  Comparison:  Noncontrast abdominal CT 01/05/2013.  Findings:  The study is moderately degraded by motion on this inpatient who had difficulty suspending respiration especially towards the end of the study which includes the post contrast images.  As demonstrated on CT, there is a well-circumscribed cystic lesion within the pancreatic tail, measuring 2.5 cm in diameter.  This demonstrates T2 hyperintensity and a dependent 4 mm nodular component posteriorly.  Postcontrast, no significant enhancement of the walls of this cystic lesion or the dependent nodule are identified.  Upstream from the cystic lesion, there is mild dilatation of the pancreatic duct in the pancreatic tail.  The pancreatic duct within the pancreatic body and head is normal in caliber.  The cystic lesion may communicate with the pancreatic duct.  There is no intra or extrahepatic biliary dilatation.  There is a probable small gallstone dependently within the gallbladder lumen.  No focal abnormalities of the liver, spleen, adrenal glands or kidneys are  demonstrated.  There is mild atelectasis at both lung bases.  Moderate stool is present throughout the colon.  There are no enlarged lymph nodes within the upper abdomen.  Aortic atherosclerosis is noted.  IMPRESSION:  1.  Well-circumscribed cystic lesion within the pancreatic tail may communicate with the pancreatic duct and/or extrinsically compress it, resulting in mild dilatation of the duct within the pancreatic tail.  The cystic lesion demonstrates no solid components or suspicious enhancement.  This could reflect postinflammatory pseudocyst or a low grade cystic neoplasm (intraductal mucinous papillary neoplasm or serous cystadenoma). 2.  Probable small gallstone.  No biliary dilatation. 3.  No other significant findings.  Given the patient's age of 9 years, imaging follow-up of the pancreatic lesion in 6 months is suggested.  That could be performed with ultrasound, CT or MRI as the primary role is to assess size stability. This recommendation follows ACR consensus guidelines:  Managing Incidental Findings on Abdominal CT:  White Paper of the ACR Incidental Findings Committee.  [facility] Radiol [phone]   Original Report Authenticated By: Carey Bullocks, M.D.    Medications:  Scheduled: . amLODipine  5 mg Oral Daily  . bisacodyl  10 mg Rectal Daily  . levothyroxine  25 mcg Oral QAC breakfast  . magnesium hydroxide  30 mL Oral QHS  .  OLANZapine  10 mg Oral QHS  . polyethylene glycol  17 g Oral Daily  . sodium chloride  1 g Oral TID   Continuous:   Assessment/Plan: 1) Pancreatic cyst 2.5 cm.   This an incidental finding.  Since mobility is a problem for her I think it is prudent to evaluate the cyst with an EUS/FNA.  There is the possibility of communication with the main PD, which suggests an IPMN.    Plan: 1) EUS with FNA tomorrow.   LOS: 3 days   Laverne Klugh D 01/08/2013, 3:50 PM  I attempted to call her son [name] at [phone], but there was no answer.

## 2013-01-09 NOTE — Op Note (Signed)
[facility] [address] [place], [address]   ENDOSCOPIC ULTRASOUND PROCEDURE REPORT  PATIENT: [name], [name]  MR#: [id number] BIRTHDATE: 1930/01/15  GENDER: Female ENDOSCOPIST: Jeani Hawking, MD REFERRED BY: PROCEDURE DATE:  01/09/2013 PROCEDURE:   Upper EUS w/FNA ASA CLASS:      Class III INDICATIONS:   1.  abnormal CT of the GI tract. MEDICATIONS: MAC sedation, administered by CRNA  DESCRIPTION OF PROCEDURE:   After the risks benefits and alternatives of the procedure were  explained, informed consent was obtained. The patient was then placed in the left, lateral, decubitus postion and IV sedation was administered. Throughout the procedure, the patients blood pressure, pulse and oxygen saturations were monitored continuously.  Under direct visualization, the [id number]  endoscope was introduced through the mouth  and advanced to the second portion of the duodenum .  Water was used as necessary to provide an acoustic interface.  Upon completion of the imaging, water was removed and the patient was sent to the recovery room in satisfactory condition.      PANCREAS:  A well circumscribed tail of the pancreas cyst was identified.  It measured 2.2 cm and there was a nodule in the cyst. I was not able to determine if there was communication with the PD.  With a 22 gauge FNA needle the cyst collapsed with fluid aspiration.  Clear staw colored fluid was obtained.  No other abnormalties were identified in the pancreas, but limited views of the head were obtained as a result of her anatomy. Her pancreas appeared to be atrophic.  The mid portion of the CBD was 6 mm and there was atherosclerosis.   The scope was then withdrawn from the patient and the procedure completed.  COMPLICATIONS: There were no complications. ENDOSCOPIC VISUALIZATION:  ULTRASONIC VISUALIZATION:  STAGING:  ENDOSCOPIC IMPRESSION: 1) 2.2 cm tail of the pancreas cyst with an intracystic  nodule. 2) Atrophic pancreas. 3) Atherosclerosis.  RECOMMENDATIONS: 1) Check CEA, Amylase, and cytology.  _______________________________ eSignedJeani Hawking, MD 01/09/2013 1:39 PM   CC:

## 2013-01-09 NOTE — Anesthesia Preprocedure Evaluation (Addendum)
Anesthesia Evaluation  Patient identified by MRN, date of birth, ID band Patient awake    Reviewed: Allergy & Precautions, H&P , NPO status , Patient's Chart, lab work & pertinent test results  Airway Mallampati: II TM Distance: >3 FB Neck ROM: Full    Dental no notable dental hx.    Pulmonary neg pulmonary ROS,  breath sounds clear to auscultation  Pulmonary exam normal       Cardiovascular hypertension, Rhythm:Regular Rate:Normal     Neuro/Psych CVA negative psych ROS   GI/Hepatic negative GI ROS, Neg liver ROS,   Endo/Other  Hypothyroidism   Renal/GU negative Renal ROS  negative genitourinary   Musculoskeletal negative musculoskeletal ROS (+) Arthritis -, Rheumatoid disorders,    Abdominal   Peds negative pediatric ROS (+)  Hematology negative hematology ROS (+)   Anesthesia Other Findings   Reproductive/Obstetrics negative OB ROS                           Anesthesia Physical Anesthesia Plan  ASA: III  Anesthesia Plan: MAC   Post-op Pain Management:    Induction: Intravenous  Airway Management Planned: Nasal Cannula  Additional Equipment:   Intra-op Plan:   Post-operative Plan:   Informed Consent: I have reviewed the patients History and Physical, chart, labs and discussed the procedure including the risks, benefits and alternatives for the proposed anesthesia with the patient or authorized representative who has indicated his/her understanding and acceptance.     Plan Discussed with: CRNA and Surgeon  Anesthesia Plan Comments:         Anesthesia Quick Evaluation

## 2013-01-09 NOTE — Progress Notes (Signed)
Physical Therapy Treatment Patient Details Name: [name] MRN: [id number] DOB: Jun 16, 1930 Today's Date: 01/09/2013 Time: 5784-6962 PT Time Calculation (min): 10 min  PT Assessment / Plan / Recommendation  PT Comments   Pt did well sitting on EOB.  Treatment cut short by arrival of nurse to take pt to procedure  Follow Up Recommendations  SNF     Does the patient have the potential to tolerate intense rehabilitation     Barriers to Discharge        Equipment Recommendations  None recommended by PT    Recommendations for Other Services    Frequency Min 3X/week   Progress towards PT Goals Progress towards PT goals: Progressing toward goals  Plan Current plan remains appropriate    Precautions / Restrictions Precautions Precautions: Fall Precaution Comments: HTN   Pertinent Vitals/Pain C/o pain in legs    Mobility       Exercises General Exercises - Upper Extremity Shoulder Flexion: AAROM;Both;5 reps;Supine Elbow Extension: AAROM;Both;5 reps;Supine General Exercises - Lower Extremity Hip Flexion/Marching: AAROM;Both;5 reps;Supine   PT Diagnosis:    PT Problem List:   PT Treatment Interventions:     PT Goals (current goals can now be found in the care plan section) Acute Rehab PT Goals Patient Stated Goal: I want to lie down and rest. PT Goal Formulation: Patient unable to participate in goal setting Time For Goal Achievement: 01/21/13 Potential to Achieve Goals: Fair  Visit Information  Last PT Received On: 01/09/13 Assistance Needed: +2    Subjective Data  Patient Stated Goal: I want to lie down and rest.   Cognition  Cognition Arousal/Alertness: Awake/alert Behavior During Therapy: Flat affect Overall Cognitive Status: No family/caregiver present to determine baseline cognitive functioning    Balance  Balance Balance Assessed: Yes Static Sitting Balance Static Sitting - Balance Support: No upper extremity supported Static Sitting - Level of  Assistance: 5: Stand by assistance Static Sitting - Comment/# of Minutes: pt able to sit on edge of bed for ~ 8 minutes.  Treatment lmited by  arrival of nurse to take pt down to her test.  End of Session PT - End of Session Activity Tolerance: Patient tolerated treatment well Patient left: in bed   GP    Rosey Bath K. Manson Passey, [place] [phone] 01/09/2013, 11:49 AM

## 2013-01-09 NOTE — Transfer of Care (Signed)
Immediate Anesthesia Transfer of Care Note  Patient: [name]  Procedure(s) Performed: Procedure(s): UPPER ENDOSCOPIC ULTRASOUND (EUS) LINEAR (N/A) FINE NEEDLE ASPIRATION (FNA) LINEAR (N/A)  Patient Location: PACU and Endoscopy Unit  Anesthesia Type:MAC  Level of Consciousness: awake, alert , oriented and patient cooperative  Airway & Oxygen Therapy: Patient Spontanous Breathing and Patient connected to nasal cannula oxygen  Post-op Assessment: Report given to PACU RN, Post -op Vital signs reviewed and stable and Patient moving all extremities  Post vital signs: Reviewed and stable  Complications: No apparent anesthesia complications

## 2013-01-09 NOTE — Progress Notes (Signed)
Patient ID: [name], female   DOB: 19-Nov-1929, 79 y.o.   MRN: [id number] [facility] PROGRESS NOTE  [name] ZHY:[id number] DOB: 26-Jan-1930 DOA: 01/05/2013 PCP: Bufford Spikes, DO  Brief narrative:  79 y.o. female with multiple medical comorbidities including but not limited to rhumatoid arthritis, osteoarthritis, multiple falls, hypertension, psychosis from nursing home who was brought to [facility] ED 01/05/2013 status post fall from wheelchair in nursing home. Patient is not good historian due to her dementia. Family is not present at the bedside to give details of medical history. Thre was no reports of fevers or respiratory distress in nursing home.   In ED, vitals were stable. CBC was significant for hemoglobin of 10.8 and BMP revealed hypokalemia of 3.2. CXR did not reveal acute cardiopulmonary process. Right knee x ray showed diffuse demineralization and degenerative changes. Lumbar spine and pelvis x ray was negative for fractures. CT head and cervical spine showed no evidence of fractures or acute intracranial findings. CT abdomen was significant for 2.6 x 1.9 cm cystic neoplasm or pseudocyst in the pancreas at the junction of the body and tail of the pancreas; Old T11 compression fracture; constipation. MRI was done and showed cystic lesion within the pancreatic tail resulting in mild dilatation of the duct within the pancreatic tail. This cystic lesion could likely represent a pseudocyst or low grade cystic neoplasm.   Assessment and Plan:  Principal Problem:  UTI (urinary tract infection), uncomplicated  - Patient has received 3 days of IV Rocephin. Currently on Cipro for three more days - Urine culture positive for Enterobacter sensitive to Rocephin  Active Problems:  Pancreatic mass  - MRI abdomen showed cystic lesion within the pancreatic tail resulting in ductal dilatation within the pancreatic tail which could represent pseudocyst or low grade cystic neoplasm  - EUS  with pancreatic cyst, plan is to check CEA, amylase and cytology HYPERTENSION  - Continue Norvasc 5 mg daily  Psychosis  - continue zyprexa  Hypokalemia  - repleted in ED - potassium WNL  Anemia  - secondary to chronic disease  - Hemoglobin stable Multiple falls from Wheelchair  - PT evaluation - will return to skilled nursing facility when stable, memory unit  Hypothyroidism  - continue synthroid  Constipation  - has regular bowel movements   Code Status: full code  Family Communication: no family at the bedside  Disposition Plan: to SNF when stable   Consultants:  Gastroenterology Antibiotics:  Rocephin 01/05/2013 until 01/07/2013 Ciprofloxacin 7/16 --> 7/18 Procedures/Studies:  ENDOSCOPIC [place] 01/09/2013: 2.2 cm tail of pancreas cyst with intracystic nodule. Atrophic pancreas. Atherosclerosis. REC'S: Check CEA, Amylase, and cytology.  Mr Abd W/wo Cm/mrcp 01/08/2013   1.  Well-circumscribed cystic lesion within pancreatic tail, ? communicate with pancreatic duct and extrinsically compress it, resulting in mild dilatation of duct within pancreatic tail.   2.  The cystic lesion demonstrates no solid components or suspicious enhancement.   3.  This could reflect postinflammatory pseudocyst or a low grade cystic neoplasm (intraductal mucinous papillary neoplasm or serous cystadenoma).  4.  Probable small gallstone.  No biliary dilatation.  5.  No other significant findings.  Given the patient's age of 39 years, imaging follow-up of the pancreatic lesion in 6 months is suggested.   HPI/Subjective: No events overnight.   Objective: Filed Vitals:   01/09/13 1430 01/09/13 1530 01/09/13 1630 01/09/13 1735  BP: 151/81 140/83 153/88 139/80  Pulse: 95 90 102 102  Temp: 98.2 F (36.8 C) 98.8 F (  37.1 C) 99.2 F (37.3 C) 99.3 F (37.4 C)  TempSrc: Oral Oral Oral Oral  Resp: 16 16 18 18   Height:      Weight:      SpO2: 95% 95% 97% 97%    Intake/Output Summary (Last 24 hours)  at 01/09/13 1831 Last data filed at 01/09/13 1724  Gross per 24 hour  Intake    540 ml  Output      0 ml  Net    540 ml    Exam:   General:  Pt is alert, follows commands appropriately, not in acute distress  Cardiovascular: Regular rate and rhythm, S1/S2, no murmurs, no rubs, no gallops  Respiratory: Clear to auscultation bilaterally, no wheezing, no crackles, no rhonchi  Abdomen: Soft, tender in LUQ, non distended, bowel sounds present, no guarding  Extremities: No edema, pulses DP and PT palpable bilaterally  Neuro: Grossly nonfocal  Data Reviewed: Basic Metabolic Panel:  Recent Labs Lab 01/05/13 1846 01/06/13 0446  NA 139 138  K 3.2* 3.5  CL 100 101  CO2 30 31  GLUCOSE 107* 101*  BUN 12 10  CREATININE 0.56 0.54  CALCIUM 9.4 9.6  MG 2.0  --    Liver Function Tests:  Recent Labs Lab 01/05/13 1846  AST 13  ALT 7  ALKPHOS 96  BILITOT 0.3  PROT 7.4  ALBUMIN 3.1*    Recent Labs Lab 01/05/13 1846  LIPASE 10*   CBC:  Recent Labs Lab 01/05/13 1935 01/06/13 0446  WBC 5.0  --   NEUTROABS 3.0  --   HGB 10.8* 11.4*  HCT 32.8* 34.7*  MCV 86.1  --   PLT 232  --    Cardiac Enzymes:  Recent Labs Lab 01/05/13 1846  CKTOTAL 117   Recent Results (from the past 240 hour(s))  URINE CULTURE     Status: None   Collection Time    01/05/13  8:01 PM      Result Value Range Status   Specimen Description URINE, CATHETERIZED   Final   Special Requests NONE   Final   Culture  Setup Time 01/06/2013 04:19   Final   Colony Count >=100,000 COLONIES/ML   Final   Culture ENTEROBACTER CLOACAE   Final   Report Status 01/08/2013 FINAL   Final   Organism ID, Bacteria ENTEROBACTER CLOACAE   Final     Scheduled Meds: . amLODipine  5 mg Oral Daily  . bisacodyl  10 mg Rectal Daily  . ciprofloxacin  500 mg Oral Q12H  . levothyroxine  25 mcg Oral QAC breakfast  . OLANZapine  10 mg Oral QHS  . polyethylene glycol  17 g Oral Daily  . sodium chloride  1 g Oral TID    Continuous Infusions:   Debbora Presto, MD  [facility] Pager [phone]  If 7PM-7AM, please contact night-coverage [email or web address] Password [facility] 01/09/2013, 6:31 PM   LOS: 4 days

## 2013-01-09 NOTE — Interval H&P Note (Signed)
History and Physical Interval Note:  01/09/2013 12:28 PM  [name]  has presented today for surgery, with the diagnosis of pancreatic cyst  The various methods of treatment have been discussed with the patient and family. After consideration of risks, benefits and other options for treatment, the patient has consented to  Procedure(s): UPPER ENDOSCOPIC ULTRASOUND (EUS) LINEAR (N/A) FINE NEEDLE ASPIRATION (FNA) LINEAR (N/A) as a surgical intervention .  The patient's history has been reviewed, patient examined, no change in status, stable for surgery.  I have reviewed the patient's chart and labs.  Questions were answered to the patient's satisfaction.     [name]

## 2013-01-10 ENCOUNTER — Encounter (HOSPITAL_COMMUNITY): Payer: Self-pay | Admitting: Gastroenterology

## 2013-01-10 LAB — BASIC METABOLIC PANEL
CO2: 25 mEq/L (ref 19–32)
Calcium: 9.4 mg/dL (ref 8.4–10.5)
Chloride: 100 mEq/L (ref 96–112)
Glucose, Bld: 114 mg/dL — ABNORMAL HIGH (ref 70–99)
Potassium: 3.5 mEq/L (ref 3.5–5.1)
Sodium: 136 mEq/L (ref 135–145)

## 2013-01-10 LAB — CBC
Hemoglobin: 11.9 g/dL — ABNORMAL LOW (ref 12.0–15.0)
MCH: 28.5 pg (ref 26.0–34.0)
RBC: 4.17 MIL/uL (ref 3.87–5.11)
WBC: 10.7 10*3/uL — ABNORMAL HIGH (ref 4.0–10.5)

## 2013-01-10 NOTE — Progress Notes (Signed)
Subjective: Feeling well.  No complaints at this time.  Objective: Vital signs in last 24 hours: Temp:  [98 F (36.7 C)-99.3 F (37.4 C)] 98 F (36.7 C) (07/17 0548) Pulse Rate:  [79-107] 79 (07/17 0548) Resp:  [15-18] 15 (07/17 0548) BP: (131-153)/(62-88) 131/62 mmHg (07/17 0548) SpO2:  [95 %-97 %] 97 % (07/17 0548) Last BM Date: 01/09/13  Intake/Output from previous day: 07/16 0701 - 07/17 0700 In: 540 [P.O.:240; I.V.:300] Out: -  Intake/Output this shift: Total I/O In: 120 [P.O.:120] Out: -   General appearance: alert and no distress GI: soft, non-tender; bowel sounds normal; no masses,  no organomegaly  Lab Results:  Recent Labs  01/10/13 0405  WBC 10.7*  HGB 11.9*  HCT 35.2*  PLT 269   BMET  Recent Labs  01/10/13 0405  NA 136  K 3.5  CL 100  CO2 25  GLUCOSE 114*  BUN 13  CREATININE 0.58  CALCIUM 9.4   LFT No results found for this basename: PROT, ALBUMIN, AST, ALT, ALKPHOS, BILITOT, BILIDIR, IBILI,  in the last 72 hours PT/INR No results found for this basename: LABPROT, INR,  in the last 72 hours Hepatitis Panel No results found for this basename: HEPBSAG, HCVAB, HEPAIGM, HEPBIGM,  in the last 72 hours C-Diff No results found for this basename: CDIFFTOX,  in the last 72 hours Fecal Lactopherrin No results found for this basename: FECLLACTOFRN,  in the last 72 hours  Studies/Results: No results found.  Medications:  Scheduled: . amLODipine  5 mg Oral Daily  . bisacodyl  10 mg Rectal Daily  . ciprofloxacin  500 mg Oral Q12H  . levothyroxine  25 mcg Oral QAC breakfast  . OLANZapine  10 mg Oral QHS  . polyethylene glycol  17 g Oral Daily  . sodium chloride  1 g Oral TID   Continuous:   Assessment/Plan: 1) Pancreatic cyst with a nodule.   Feeling well.  No complaints at this time.  Cytology, Amylase, and CEA pending.  Plan: 1) Await results.   2) Signing off.   LOS: 5 days   [name] D 01/10/2013, 2:20 PM

## 2013-01-10 NOTE — Progress Notes (Signed)
[facility] PROGRESS NOTE  [name]:[id number] DOB: 03-18-1930 DOA: 01/05/2013 PCP: Bufford Spikes, DO  Brief narrative: 79 y.o. female with multiple medical comorbidities including but not limited to rhumatoid arthritis, osteoarthritis, multiple falls, hypertension, psychosis from nursing home who was brought to [facility] ED 01/05/2013 status post fall from wheelchair in nursing home.   In ED, vitals were stable. CBC was significant for hemoglobin of 10.8 and BMP revealed hypokalemia of 3.2. CXR did not reveal acute cardiopulmonary process. Right knee x ray showed diffuse demineralization and degenerative changes. Lumbar spine and pelvis x ray was negative for fractures. CT head and cervical spine showed no evidence of fractures or acute intracranial findings. CT abdomen was significant for 2.6 x 1.9 cm cystic neoplasm or pseudocyst in the pancreas at the junction of the body and tail of the pancreas; Old T11 compression fracture; constipation. MRI was done and showed cystic lesion within the pancreatic tail resulting in mild dilatation of the duct within the pancreatic tail. This cystic lesion could likely represent a pseudocyst or low grade cystic neoplasm. Patient had endoscopic ultrasound done 01/09/2013 with findings of pancreatic tail cyst.  Assessment and Plan:   Principal Problem:  UTI (urinary tract infection), uncomplicated  - Patient has received 3 days of IV Rocephin. Currently on Cipro until and including 01/11/2013  - Urine culture positive for Enterobacter sensitive to Rocephin   Active Problems:  Pancreatic mass/ cyst - MRI abdomen showed cystic lesion within the pancreatic tail resulting in ductal dilatation within the pancreatic tail which could represent pseudocyst or low grade cystic neoplasm  - EUS with pancreatic tail cyst; tumor markers unremarkable (CA 19-9); follow up cytology results HYPERTENSION  - Continue Norvasc 5 mg daily  - BP 131/62 Psychosis  -  continue zyprexa  Hypokalemia  - repleted in ED - potassium within normal limits today Anemia  - secondary to chronic disease  - Hemoglobin stable at 11.9 - no indications for transfusion Multiple falls from Wheelchair  - PT evaluation - will return to skilled nursing facility when stable, memory unit  Hypothyroidism  - continue synthroid  Constipation  - has regular bowel movements   Code Status: full code  Family Communication: no family at the bedside  Disposition Plan: to SNF when stable   Consultants:  Gastroenterology (dr. Jeani Hawking) Antibiotics:  Rocephin 01/05/2013 until 01/07/2013  Ciprofloxacin 7/16 --> 7/18  Procedures/Studies:  ENDOSCOPIC [place] 01/09/2013: 2.2 cm tail of pancreas cyst with intracystic nodule. Atrophic pancreas. Atherosclerosis. REC'S: Check CEA, Amylase, and cytology. Mr Abd W/wo Cm/mrcp 01/08/2013   1. Well-circumscribed cystic lesion within pancreatic tail, ? communicate with pancreatic duct and  extrinsically compress it, resulting in mild dilatation of duct within pancreatic tail.   2. The cystic lesion demonstrates no solid components or suspicious enhancement.   3. This could reflect postinflammatory pseudocyst or a low grade cystic neoplasm (intraductal mucinous  papillary neoplasm or serous cystadenoma).   4. Probable small gallstone. No biliary dilatation.   5. No other significant findings. Given the patient's age of 43 years, imaging follow-up of the pancreatic lesion  in 6 months is suggested.    HPI/Subjective: No events overnight.   Objective: Filed Vitals:   01/09/13 1735 01/09/13 2148 01/09/13 2155 01/10/13 0548  BP: 139/80 148/86  131/62  Pulse: 102 107 98 79  Temp: 99.3 F (37.4 C) 99.3 F (37.4 C)  98 F (36.7 C)  TempSrc: Oral Oral  Oral  Resp: 18 18  15  Height:      Weight:      SpO2: 97% 96%  97%    Intake/Output Summary (Last 24 hours) at 01/10/13 1123 Last data filed at 01/09/13 2200  Gross per 24 hour   Intake    540 ml  Output      0 ml  Net    540 ml    Exam:   General:  Pt is alert, follows commands appropriately, not in acute distress  Cardiovascular: Regular rate and rhythm, S1/S2 appreciated  Respiratory: Clear to auscultation bilaterally, no wheezing, no crackles, no rhonchi  Abdomen: Soft, tender in mid abdomen, non distended, bowel sounds present, no guarding  Extremities: No edema, pulses DP and PT palpable bilaterally  Neuro: Grossly nonfocal  Data Reviewed: Basic Metabolic Panel:  Recent Labs Lab 01/05/13 1846 01/06/13 0446 01/10/13 0405  NA 139 138 136  K 3.2* 3.5 3.5  CL 100 101 100  CO2 30 31 25   GLUCOSE 107* 101* 114*  BUN 12 10 13   CREATININE 0.56 0.54 0.58  CALCIUM 9.4 9.6 9.4  MG 2.0  --   --    Liver Function Tests:  Recent Labs Lab 01/05/13 1846  AST 13  ALT 7  ALKPHOS 96  BILITOT 0.3  PROT 7.4  ALBUMIN 3.1*    Recent Labs Lab 01/05/13 1846  LIPASE 10*   No results found for this basename: AMMONIA,  in the last 168 hours CBC:  Recent Labs Lab 01/05/13 1935 01/06/13 0446 01/10/13 0405  WBC 5.0  --  10.7*  NEUTROABS 3.0  --   --   HGB 10.8* 11.4* 11.9*  HCT 32.8* 34.7* 35.2*  MCV 86.1  --  84.4  PLT 232  --  269   Cardiac Enzymes:  Recent Labs Lab 01/05/13 1846  CKTOTAL 117   BNP: No components found with this basename: POCBNP,  CBG: No results found for this basename: GLUCAP,  in the last 168 hours  Recent Results (from the past 240 hour(s))  URINE CULTURE     Status: None   Collection Time    01/05/13  8:01 PM      Result Value Range Status   Specimen Description URINE, CATHETERIZED   Final   Special Requests NONE   Final   Culture  Setup Time 01/06/2013 04:19   Final   Colony Count >=100,000 COLONIES/ML   Final   Culture ENTEROBACTER CLOACAE   Final   Report Status 01/08/2013 FINAL   Final   Organism ID, Bacteria ENTEROBACTER CLOACAE   Final     Scheduled Meds: . amLODipine  5 mg Oral Daily  .  bisacodyl  10 mg Rectal Daily  . ciprofloxacin  500 mg Oral Q12H  . levothyroxine  25 mcg Oral QAC breakfast  . OLANZapine  10 mg Oral QHS  . polyethylene glycol  17 g Oral Daily     Debbora Presto, MD  [facility] Pager [phone]  If 7PM-7AM, please contact night-coverage [email or web address] Password [email or web address] 01/10/2013, 11:23 AM   LOS: 5 days

## 2013-01-10 NOTE — Care Management Note (Signed)
Per pt disposition is back to [facility] with [facility] services. Per pt choice [facility] to provide [facility] services. [facility] Tamala Bari notified at [phone] of referral. No other needs identified.    Roxy Manns Nahmir Zeidman,RN,BSN [id number]

## 2013-01-11 MED ORDER — BISACODYL 10 MG RE SUPP: 10.0000 mg | Freq: Every day | RECTAL | Status: DC | PRN

## 2013-01-11 MED ORDER — AMLODIPINE BESYLATE 5 MG PO TABS: 5.0000 mg | ORAL_TABLET | Freq: Every day | ORAL | Status: DC

## 2013-01-11 MED ORDER — HYDROCODONE-ACETAMINOPHEN 10-325 MG PO TABS
1.0000 | ORAL_TABLET | Freq: Four times a day (QID) | ORAL | Status: DC | PRN
  Administered 2013-01-11: 1 via ORAL
  Filled 2013-01-11: qty 1

## 2013-01-11 NOTE — Discharge Summary (Signed)
Physician Discharge Summary  [name] ZOX:[id number] DOB: 19-Sep-1929 DOA: 01/05/2013  PCP: Bufford Spikes, DO  Admit date: 01/05/2013 Discharge date: 01/11/2013  Recommendations for Outpatient Follow-up:  1. Pt will need to follow up with PCP in 2-3 weeks post discharge 2. Please obtain BMP to evaluate electrolytes and kidney function 3. Please also check CBC to evaluate Hg and Hct levels 4. Please note that pt has completed the therapy for UTI so no prescription for ABX provided  5. Pt will need to follow up with GI specialist [name] and will need to ensure to follow up on Cytology, Amylase, and CEA which are pending upon discharge   Discharge Diagnoses: UTI  Principal Problem:   UTI (urinary tract infection), uncomplicated Active Problems:   HYPERTENSION   Psychosis   Hypokalemia   Anemia   multiple falls from  wheelchair   Pancreatic mass   Hypothyroidism   Constipation  Discharge Condition: Stable  Diet recommendation: Heart healthy diet discussed in details   Brief narrative:  79 y.o. female with multiple medical comorbidities including but not limited to rhumatoid arthritis, osteoarthritis, multiple falls, hypertension, psychosis from nursing home who was brought to [facility] ED 01/05/2013 status post fall from wheelchair in nursing home.   In ED, vitals were stable. CBC was significant for hemoglobin of 10.8 and BMP revealed hypokalemia of 3.2. CXR did not reveal acute cardiopulmonary process. Right knee x ray showed diffuse demineralization and degenerative changes. Lumbar spine and pelvis x ray was negative for fractures. CT head and cervical spine showed no evidence of fractures or acute intracranial findings. CT abdomen was significant for 2.6 x 1.9 cm cystic neoplasm or pseudocyst in the pancreas at the junction of the body and tail of the pancreas; Old T11 compression fracture; constipation. MRI was done and showed cystic lesion within the pancreatic tail resulting in  mild dilatation of the duct within the pancreatic tail. This cystic lesion could likely represent a pseudocyst or low grade cystic neoplasm. Patient had endoscopic ultrasound done 01/09/2013 with findings of pancreatic tail cyst.   Assessment and Plan:  Principal Problem:  UTI (urinary tract infection), uncomplicated  - Patient has received 3 days of IV Rocephin. Currently on Cipro until and including 01/11/2013  - Urine culture positive for Enterobacter sensitive to Rocephin  - please note that today's is the last dose of ABX and no need for continuation of ABX post discharge  Active Problems:  Pancreatic mass/ cyst  - MRI abdomen showed cystic lesion within the pancreatic tail resulting in ductal dilatation within the pancreatic tail which could represent pseudocyst or low grade cystic neoplasm  - EUS with pancreatic tail cyst; tumor markers unremarkable (CA 19-9); follow up cytology results  HYPERTENSION  - Continue Norvasc 5 mg daily  - BP stable inpatient  Psychosis  - continue zyprexa  Hypokalemia  - repleted in ED - potassium within normal limits today  Anemia  - secondary to chronic disease  - Hemoglobin stable while inpatient  - no indications for transfusion  Multiple falls from Wheelchair  - PT evaluation - will return to skilled nursing facility memory unit  Hypothyroidism  - continue synthroid  Constipation  - has regular bowel movements   Code Status: full code  Family Communication: no family at the bedside   Consultants:  Gastroenterology (dr. Jeani Hawking) Antibiotics:  Rocephin 01/05/2013 until 01/07/2013  Ciprofloxacin 7/16 --> 7/18 Procedures/Studies:  ENDOSCOPIC [place] 01/09/2013: 2.2 cm tail of pancreas cyst with intracystic nodule.  Atrophic pancreas. Atherosclerosis. REC'S: Check CEA, Amylase, and cytology.  Mr Abd W/wo Cm/mrcp 01/08/2013  1. Well-circumscribed cystic lesion within pancreatic tail, ? communicate with pancreatic duct and extrinsically compress  it, resulting in mild dilatation of duct within pancreatic tail.  2. The cystic lesion demonstrates no solid components or suspicious enhancement.  3. This could reflect postinflammatory pseudocyst or a low grade cystic neoplasm (intraductal mucinous papillary neoplasm or serous cystadenoma).  4. Probable small gallstone. No biliary dilatation.  5. No other significant findings. Given the patient's age of 34 years, imaging follow-up of the pancreatic lesion in 6 months is suggested.   Discharge Exam: Filed Vitals:   01/11/13 0536  BP: 142/61  Pulse: 67  Temp:   Resp:    Filed Vitals:   01/10/13 2020 01/10/13 2050 01/11/13 0424 01/11/13 0536  BP: 138/62  161/70 142/61  Pulse: 70 90 66 67  Temp: 98.1 F (36.7 C)  97.3 F (36.3 C)   TempSrc: Oral  Oral   Resp: 18  18   Height:      Weight:   62.823 kg (138 lb 8 oz)   SpO2: 97%  100%     General: Pt is alert, follows some commands appropriately, not in acute distress Cardiovascular: Regular rate and rhythm, S1/S2 +, no murmurs, no rubs, no gallops Respiratory: Clear to auscultation bilaterally, no wheezing, no crackles, no rhonchi Abdominal: Soft, non tender, non distended, bowel sounds +, no guarding Extremities: no edema, no cyanosis, pulses palpable bilaterally DP and PT Neuro: Grossly nonfocal, alert to name only   Discharge Instructions  Discharge Orders   Future Orders Complete By Expires     Diet - low sodium heart healthy  As directed     Increase activity slowly  As directed         Medication List    STOP taking these medications       HYDROcodone-acetaminophen 10-325 MG per tablet  Commonly known as:  NORCO      TAKE these medications       acetaminophen 500 MG tablet  Commonly known as:  TYLENOL  Take 500 mg by mouth every 6 (six) hours as needed for pain.     alum & mag hydroxide-simeth 200-200-20 MG/5ML suspension  Commonly known as:  MAALOX/MYLANTA  Take 30 mLs by mouth every 6 (six) hours as  needed for indigestion.     amLODipine 5 MG tablet  Commonly known as:  NORVASC  Take 1 tablet (5 mg total) by mouth daily.     bisacodyl 10 MG suppository  Commonly known as:  DULCOLAX  Place 1 suppository (10 mg total) rectally daily as needed.     diphenhydramine-acetaminophen 25-500 MG Tabs  Commonly known as:  TYLENOL PM  Take 1 tablet by mouth at bedtime as needed (for insomnia).     guaiFENesin 100 MG/5ML Soln  Commonly known as:  ROBITUSSIN  Take 5 mLs by mouth every 6 (six) hours as needed (for cough).     levothyroxine 25 MCG tablet  Commonly known as:  SYNTHROID, LEVOTHROID  Take 25 mcg by mouth daily before breakfast.     loperamide 2 MG capsule  Commonly known as:  IMODIUM  Take 2 mg by mouth every 3 (three) hours as needed (with each loose stool).     magnesium hydroxide 400 MG/5ML suspension  Commonly known as:  MILK OF MAGNESIA  Take 30 mLs by mouth at bedtime as needed for constipation.  OLANZapine 10 MG tablet  Commonly known as:  ZYPREXA  Take 10 mg by mouth daily.     polyethylene glycol packet  Commonly known as:  MIRALAX / GLYCOLAX  Take 17 g by mouth every 3 (three) days.     sodium chloride 1 G tablet  Take 1 g by mouth 3 (three) times daily.     torsemide 20 MG tablet  Commonly known as:  DEMADEX  Take 20 mg by mouth daily.           Follow-up Information   Follow up with REED, TIFFANY, DO In 1 week.   Contact information:   [address] [place] [address] [phone]        The results of significant diagnostics from this hospitalization (including imaging, microbiology, ancillary and laboratory) are listed below for reference.     Microbiology: Recent Results (from the past 240 hour(s))  URINE CULTURE     Status: None   Collection Time    01/05/13  8:01 PM      Result Value Range Status   Specimen Description URINE, CATHETERIZED   Final   Special Requests NONE   Final   Culture  Setup Time 01/06/2013 04:19    Final   Colony Count >=100,000 COLONIES/ML   Final   Culture ENTEROBACTER CLOACAE   Final   Report Status 01/08/2013 FINAL   Final   Organism ID, Bacteria ENTEROBACTER CLOACAE   Final     Labs: Basic Metabolic Panel:  Recent Labs Lab 01/05/13 1846 01/06/13 0446 01/10/13 0405  NA 139 138 136  K 3.2* 3.5 3.5  CL 100 101 100  CO2 30 31 25   GLUCOSE 107* 101* 114*  BUN 12 10 13   CREATININE 0.56 0.54 0.58  CALCIUM 9.4 9.6 9.4  MG 2.0  --   --    Liver Function Tests:  Recent Labs Lab 01/05/13 1846  AST 13  ALT 7  ALKPHOS 96  BILITOT 0.3  PROT 7.4  ALBUMIN 3.1*    Recent Labs Lab 01/05/13 1846  LIPASE 10*   CBC:  Recent Labs Lab 01/05/13 1935 01/06/13 0446 01/10/13 0405  WBC 5.0  --  10.7*  NEUTROABS 3.0  --   --   HGB 10.8* 11.4* 11.9*  HCT 32.8* 34.7* 35.2*  MCV 86.1  --  84.4  PLT 232  --  269   Cardiac Enzymes:  Recent Labs Lab 01/05/13 1846  CKTOTAL 117    SIGNED: Time coordinating discharge: Over 30 minutes  Debbora Presto, MD  [facility] 01/11/2013, 1:42 PM Pager [phone]  If 7PM-7AM, please contact night-coverage [email or web address] Password [email or web address]

## 2013-01-11 NOTE — Progress Notes (Signed)
Pt for discharge to [facility].  CSW facilitated pt discharge needs including contacting facility, faxing pt discharge information to facility, confirming facility reviewed and received information, discussing with pt at bedside and pt son via telephone, providing RN phone number to call report, and arranged ambulance transportation for pt to [facility] (Service Request ID#: [id number]).  No further social work needs identified at this time.  CSW signing off.   Jacklynn Lewis, MSW, LCSWA  Clinical Social Work [phone]

## 2013-01-11 NOTE — Anesthesia Postprocedure Evaluation (Signed)
  Anesthesia Post-op Note  Patient: [name]  Procedure(s) Performed: Procedure(s) (LRB): UPPER ENDOSCOPIC ULTRASOUND (EUS) LINEAR (N/A) FINE NEEDLE ASPIRATION (FNA) LINEAR (N/A)  Patient Location: PACU  Anesthesia Type: MAC  Level of Consciousness: awake and alert   Airway and Oxygen Therapy: Patient Spontanous Breathing  Post-op Pain: mild  Post-op Assessment: Post-op Vital signs reviewed, Patient's Cardiovascular Status Stable, Respiratory Function Stable, Patent Airway and No signs of Nausea or vomiting  Last Vitals:  Filed Vitals:   01/11/13 1409  BP: 144/72  Pulse: 70  Temp: 36.8 C  Resp: 20    Post-op Vital Signs: stable   Complications: No apparent anesthesia complications

## 2013-01-11 NOTE — Progress Notes (Signed)
Occupational Therapy Treatment Patient Details Name: [name] MRN: [id number] DOB: 27-Dec-1929 Today's Date: 01/11/2013 Time: 4098-1191 OT Time Calculation (min): 29 min  OT Assessment / Plan / Recommendation  OT comments  Pt tolerated exercise EOB. Plan is now for pt to transfer back to ALF with [facility] therapies  Follow Up Recommendations  Home health OT;Supervision/Assistance - 24 hour    Barriers to Discharge       Equipment Recommendations  None recommended by OT    Recommendations for Other Services    Frequency Min 2X/week   Progress towards OT Goals Progress towards OT goals: Progressing toward goals  Plan      Precautions / Restrictions Precautions Precautions: Fall Restrictions Weight Bearing Restrictions: No   Pertinent Vitals/Pain     ADL  Eating/Feeding:  (Pt deferred attempt to do for herself) ADL Comments: Pt moved to EOB sittting.  Pt with complaint of shoulder pain.  While EOB she performed head/neck ROM, shoulder ROM and trunk rotation in attempts to alleviate pain.  Pt slow to move and was fatigued afterwards, unable to complete further tasks.     OT Diagnosis:    OT Problem List:   OT Treatment Interventions:     OT Goals(current goals can now be found in the care plan section)    Visit Information  Last OT Received On: 01/11/13 Assistance Needed: +2 History of Present Illness: [name] is a 79 y.o. female who has fallen out of her wheelchair several times this week. She was evaluated in the emergency room yesterday and released. She fell again today. She reports her appetite has been poor for about 3 weeks. She comes from nursing home. She is wheelchair-bound. She is chronically constipated. She had mild nausea earlier in the week, but no vomiting. No abdominal pain. No dysuria. No fevers or chills. She complains of back pain. She has had multiple scans and x-rays. Workup is significant for urinary tract infection, large stool burden on  CAT scan, pancreatic cystic mass. 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 SNF/ALF level    Subjective Data      Prior Functioning       Cognition  Cognition Arousal/Alertness: Awake/alert Behavior During Therapy: Flat affect Overall Cognitive Status: History of cognitive impairments - at baseline    Mobility  Bed Mobility Bed Mobility: Supine to Sit;Sitting - Scoot to Edge of Bed;Sit to Supine Supine to Sit: 3: Mod assist;HOB flat Sitting - Scoot to Edge of Bed: 3: Mod assist;With rail Sit to Supine: 2: Max assist;HOB flat Details for Bed Mobility Assistance: step by step cues and assist with legs and shoulders    Exercises  General Exercises - Upper Extremity Shoulder Flexion: AROM;AAROM;Both;10 reps;Seated Other Exercises Other Exercises: neck rotation Lt and Rt x 5 Other Exercises: Neck lateral flexion x 5 Lt and Rt  Other Exercises: neck flexion/ext 5 reps each   Balance     End of Session OT - End of Session Activity Tolerance: Patient limited by fatigue Patient left: in bed;with call bell/phone within reach;with family/visitor present  GO     Keiston Manley, Ursula Alert M 01/11/2013, 2:31 PM

## 2013-01-11 NOTE — Progress Notes (Signed)
Report given to Warehouse manager at [facility].

## 2013-01-11 NOTE — Progress Notes (Signed)
Pt ready for d/c. PIV removed, WNL. No change in pt condition since AM assessment.  Pt d/c'd to [facility] via EMS. Eugene Garnet RN

## 2013-01-15 ENCOUNTER — Telehealth: Payer: Self-pay | Admitting: Geriatric Medicine

## 2013-01-15 NOTE — Telephone Encounter (Signed)
Patient started home health today.

## 2013-01-16 ENCOUNTER — Encounter: Payer: Self-pay | Admitting: *Deleted

## 2013-01-17 ENCOUNTER — Encounter: Payer: Medicare Other | Admitting: Internal Medicine

## 2013-03-18 ENCOUNTER — Emergency Department (HOSPITAL_COMMUNITY): Payer: Medicare PPO

## 2013-03-18 ENCOUNTER — Encounter (HOSPITAL_COMMUNITY)
Admission: EM | Disposition: A | Discharge status: Skilled Nursing Facility | Payer: Self-pay | Source: Home / Self Care | Attending: Internal Medicine

## 2013-03-18 ENCOUNTER — Inpatient Hospital Stay (HOSPITAL_COMMUNITY): Payer: Medicare PPO

## 2013-03-18 ENCOUNTER — Encounter (HOSPITAL_COMMUNITY): Payer: Self-pay | Admitting: Emergency Medicine

## 2013-03-18 ENCOUNTER — Encounter (HOSPITAL_COMMUNITY): Payer: Self-pay | Admitting: Certified Registered Nurse Anesthetist

## 2013-03-18 ENCOUNTER — Inpatient Hospital Stay (HOSPITAL_COMMUNITY): Payer: Medicare PPO | Admitting: Certified Registered Nurse Anesthetist

## 2013-03-18 ENCOUNTER — Inpatient Hospital Stay (HOSPITAL_COMMUNITY)
Admission: EM | Admit: 2013-03-18 | Discharge: 2013-03-22 | DRG: 481 | Disposition: A | Discharge status: Skilled Nursing Facility | Payer: Medicare PPO | Attending: Internal Medicine | Admitting: Internal Medicine

## 2013-03-18 DIAGNOSIS — I2510 Atherosclerotic heart disease of native coronary artery without angina pectoris: Secondary | ICD-10-CM | POA: Diagnosis present

## 2013-03-18 DIAGNOSIS — Y9210 Unspecified residential institution as the place of occurrence of the external cause: Secondary | ICD-10-CM | POA: Diagnosis present

## 2013-03-18 DIAGNOSIS — S72001A Fracture of unspecified part of neck of right femur, initial encounter for closed fracture: Secondary | ICD-10-CM

## 2013-03-18 DIAGNOSIS — M810 Age-related osteoporosis without current pathological fracture: Secondary | ICD-10-CM | POA: Diagnosis present

## 2013-03-18 DIAGNOSIS — W06XXXA Fall from bed, initial encounter: Secondary | ICD-10-CM | POA: Diagnosis present

## 2013-03-18 DIAGNOSIS — Z79899 Other long term (current) drug therapy: Secondary | ICD-10-CM

## 2013-03-18 DIAGNOSIS — G894 Chronic pain syndrome: Secondary | ICD-10-CM | POA: Diagnosis present

## 2013-03-18 DIAGNOSIS — A498 Other bacterial infections of unspecified site: Secondary | ICD-10-CM | POA: Diagnosis not present

## 2013-03-18 DIAGNOSIS — F1920 Other psychoactive substance dependence, uncomplicated: Secondary | ICD-10-CM | POA: Diagnosis present

## 2013-03-18 DIAGNOSIS — F03918 Unspecified dementia, unspecified severity, with other behavioral disturbance: Secondary | ICD-10-CM | POA: Diagnosis present

## 2013-03-18 DIAGNOSIS — Z8673 Personal history of transient ischemic attack (TIA), and cerebral infarction without residual deficits: Secondary | ICD-10-CM

## 2013-03-18 DIAGNOSIS — Z7982 Long term (current) use of aspirin: Secondary | ICD-10-CM

## 2013-03-18 DIAGNOSIS — S72001D Fracture of unspecified part of neck of right femur, subsequent encounter for closed fracture with routine healing: Secondary | ICD-10-CM

## 2013-03-18 DIAGNOSIS — S72143A Displaced intertrochanteric fracture of unspecified femur, initial encounter for closed fracture: Principal | ICD-10-CM | POA: Diagnosis present

## 2013-03-18 DIAGNOSIS — D649 Anemia, unspecified: Secondary | ICD-10-CM

## 2013-03-18 DIAGNOSIS — I10 Essential (primary) hypertension: Secondary | ICD-10-CM | POA: Diagnosis present

## 2013-03-18 DIAGNOSIS — M069 Rheumatoid arthritis, unspecified: Secondary | ICD-10-CM | POA: Diagnosis present

## 2013-03-18 DIAGNOSIS — F0391 Unspecified dementia with behavioral disturbance: Secondary | ICD-10-CM | POA: Diagnosis present

## 2013-03-18 DIAGNOSIS — D62 Acute posthemorrhagic anemia: Secondary | ICD-10-CM | POA: Diagnosis not present

## 2013-03-18 DIAGNOSIS — N390 Urinary tract infection, site not specified: Secondary | ICD-10-CM

## 2013-03-18 DIAGNOSIS — K5900 Constipation, unspecified: Secondary | ICD-10-CM

## 2013-03-18 DIAGNOSIS — E039 Hypothyroidism, unspecified: Secondary | ICD-10-CM | POA: Diagnosis present

## 2013-03-18 DIAGNOSIS — S72009A Fracture of unspecified part of neck of unspecified femur, initial encounter for closed fracture: Secondary | ICD-10-CM

## 2013-03-18 DIAGNOSIS — W07XXXA Fall from chair, initial encounter: Secondary | ICD-10-CM

## 2013-03-18 DIAGNOSIS — E876 Hypokalemia: Secondary | ICD-10-CM | POA: Diagnosis present

## 2013-03-18 HISTORY — PX: FEMUR IM NAIL: SHX1597

## 2013-03-18 LAB — BASIC METABOLIC PANEL
BUN: 11 mg/dL (ref 6–23)
CO2: 29 mEq/L (ref 19–32)
Chloride: 96 mEq/L (ref 96–112)
Chloride: 99 mEq/L (ref 96–112)
Creatinine, Ser: 0.45 mg/dL — ABNORMAL LOW (ref 0.50–1.10)
GFR calc Af Amer: >90 mL/min (ref >90)
GFR calc Af Amer: >90 mL/min (ref >90)
GFR calc non Af Amer: >90 mL/min (ref >90)
Potassium: 3.3 mEq/L — ABNORMAL LOW (ref 3.5–5.1)
Potassium: 3.8 mEq/L (ref 3.5–5.1)
Sodium: 136 mEq/L (ref 135–145)

## 2013-03-18 LAB — PROTIME-INR
INR: 1.04 (ref 0.00–1.49)
Prothrombin Time: 13.4 seconds (ref 11.6–15.2)

## 2013-03-18 LAB — CBC WITH DIFFERENTIAL/PLATELET
HCT: 33.8 % — ABNORMAL LOW (ref 36.0–46.0)
Hemoglobin: 11.4 g/dL — ABNORMAL LOW (ref 12.0–15.0)
Lymphocytes Relative: 27 % (ref 12–46)
Monocytes Absolute: 0.6 10*3/uL (ref 0.1–1.0)
Monocytes Relative: 9 % (ref 3–12)
Neutro Abs: 3.6 10*3/uL (ref 1.7–7.7)
WBC: 6 10*3/uL (ref 4.0–10.5)

## 2013-03-18 LAB — CBC
Platelets: 280 10*3/uL (ref 150–400)
RBC: 3.56 MIL/uL — ABNORMAL LOW (ref 3.87–5.11)
RDW: 14.1 % (ref 11.5–15.5)
WBC: 6.1 10*3/uL (ref 4.0–10.5)

## 2013-03-18 SURGERY — INSERTION, INTRAMEDULLARY ROD, FEMUR
Anesthesia: General | Site: Hip | Laterality: Right | Wound class: Clean

## 2013-03-18 MED ORDER — FENTANYL CITRATE 0.05 MG/ML IJ SOLN
25.0000 ug | INTRAMUSCULAR | Status: AC | PRN
  Administered 2013-03-18 (×6): 25 ug via INTRAVENOUS

## 2013-03-18 MED ORDER — DOCUSATE SODIUM 100 MG PO CAPS
100.0000 mg | ORAL_CAPSULE | Freq: Two times a day (BID) | ORAL | Status: DC
  Administered 2013-03-18 – 2013-03-22 (×8): 100 mg via ORAL
  Filled 2013-03-18 (×11): qty 1

## 2013-03-18 MED ORDER — CEFAZOLIN SODIUM-DEXTROSE 2-3 GM-% IV SOLR
2.0000 g | Freq: Four times a day (QID) | INTRAVENOUS | Status: AC
  Administered 2013-03-18 (×2): 2 g via INTRAVENOUS
  Filled 2013-03-18 (×2): qty 50

## 2013-03-18 MED ORDER — ENOXAPARIN SODIUM 40 MG/0.4ML ~~LOC~~ SOLN
40.0000 mg | SUBCUTANEOUS | Status: DC
  Administered 2013-03-18: 40 mg via SUBCUTANEOUS
  Filled 2013-03-18 (×2): qty 0.4

## 2013-03-18 MED ORDER — NEOSTIGMINE METHYLSULFATE 1 MG/ML IJ SOLN
INTRAMUSCULAR | Status: DC | PRN
  Administered 2013-03-18: 3 mg via INTRAVENOUS

## 2013-03-18 MED ORDER — OXYCODONE HCL 5 MG/5ML PO SOLN: 5.0000 mg | Freq: Once | ORAL | Status: DC | PRN

## 2013-03-18 MED ORDER — LIDOCAINE HCL (CARDIAC) 20 MG/ML IV SOLN
INTRAVENOUS | Status: DC | PRN
  Administered 2013-03-18: 60 mg via INTRAVENOUS

## 2013-03-18 MED ORDER — MENTHOL 3 MG MT LOZG
1.0000 | LOZENGE | OROMUCOSAL | Status: DC | PRN
  Filled 2013-03-18: qty 9

## 2013-03-18 MED ORDER — LEVOTHYROXINE SODIUM 100 MCG IV SOLR
12.5000 ug | Freq: Every day | INTRAVENOUS | Status: DC
  Administered 2013-03-18 – 2013-03-19 (×2): 12.5 ug via INTRAVENOUS
  Filled 2013-03-18 (×3): qty 5

## 2013-03-18 MED ORDER — 0.9 % SODIUM CHLORIDE (POUR BTL) OPTIME
TOPICAL | Status: DC | PRN
  Administered 2013-03-18: 1000 mL

## 2013-03-18 MED ORDER — MIDAZOLAM HCL 2 MG/2ML IJ SOLN: 1.0000 mg | INTRAMUSCULAR | Status: DC | PRN

## 2013-03-18 MED ORDER — FENTANYL CITRATE 0.05 MG/ML IJ SOLN
INTRAMUSCULAR | Status: DC | PRN
  Administered 2013-03-18: 50 ug via INTRAVENOUS
  Administered 2013-03-18: 100 ug via INTRAVENOUS
  Administered 2013-03-18: 50 ug via INTRAVENOUS

## 2013-03-18 MED ORDER — SODIUM CHLORIDE 0.9 % IV SOLN: 20.0000 mL | INTRAVENOUS | Status: DC

## 2013-03-18 MED ORDER — CHLORHEXIDINE GLUCONATE 4 % EX LIQD
60.0000 mL | Freq: Once | CUTANEOUS | Status: AC
  Administered 2013-03-18: 4 via TOPICAL
  Filled 2013-03-18: qty 60

## 2013-03-18 MED ORDER — METOCLOPRAMIDE HCL 5 MG PO TABS
5.0000 mg | ORAL_TABLET | Freq: Three times a day (TID) | ORAL | Status: DC | PRN
  Filled 2013-03-18: qty 2

## 2013-03-18 MED ORDER — LACTATED RINGERS IV SOLN
INTRAVENOUS | Status: DC | PRN
  Administered 2013-03-18: 12:00:00 via INTRAVENOUS

## 2013-03-18 MED ORDER — ONDANSETRON HCL 4 MG/2ML IJ SOLN: 4.0000 mg | Freq: Four times a day (QID) | INTRAMUSCULAR | Status: DC | PRN

## 2013-03-18 MED ORDER — PHENOL 1.4 % MT LIQD
1.0000 | OROMUCOSAL | Status: DC | PRN
  Filled 2013-03-18: qty 177

## 2013-03-18 MED ORDER — ACETAMINOPHEN 650 MG RE SUPP: 650.0000 mg | Freq: Four times a day (QID) | RECTAL | Status: DC | PRN

## 2013-03-18 MED ORDER — GLYCOPYRROLATE 0.2 MG/ML IJ SOLN
INTRAMUSCULAR | Status: DC | PRN
  Administered 2013-03-18: 0.4 mg via INTRAVENOUS

## 2013-03-18 MED ORDER — OXYCODONE HCL 5 MG PO TABS: 5.0000 mg | ORAL_TABLET | Freq: Once | ORAL | Status: DC | PRN

## 2013-03-18 MED ORDER — SODIUM CHLORIDE 0.9 % IJ SOLN
3.0000 mL | Freq: Two times a day (BID) | INTRAMUSCULAR | Status: DC
  Administered 2013-03-19 – 2013-03-21 (×5): 3 mL via INTRAVENOUS

## 2013-03-18 MED ORDER — POTASSIUM CHLORIDE 10 MEQ/100ML IV SOLN
10.0000 meq | INTRAVENOUS | Status: AC
  Administered 2013-03-18: 10 meq via INTRAVENOUS
  Filled 2013-03-18 (×3): qty 100

## 2013-03-18 MED ORDER — MORPHINE SULFATE 2 MG/ML IJ SOLN: 1.0000 mg | INTRAMUSCULAR | Status: DC | PRN

## 2013-03-18 MED ORDER — SUCCINYLCHOLINE CHLORIDE 20 MG/ML IJ SOLN
INTRAMUSCULAR | Status: DC | PRN
  Administered 2013-03-18: 100 mg via INTRAVENOUS

## 2013-03-18 MED ORDER — FENTANYL CITRATE 0.05 MG/ML IJ SOLN
INTRAMUSCULAR | Status: AC
  Administered 2013-03-18: 25 ug via INTRAVENOUS
  Filled 2013-03-18: qty 2

## 2013-03-18 MED ORDER — POTASSIUM CHLORIDE IN NACL 20-0.9 MEQ/L-% IV SOLN
INTRAVENOUS | Status: AC
  Administered 2013-03-18 (×2): via INTRAVENOUS
  Filled 2013-03-18 (×3): qty 1000

## 2013-03-18 MED ORDER — ALUM & MAG HYDROXIDE-SIMETH 200-200-20 MG/5ML PO SUSP: 30.0000 mL | ORAL | Status: DC | PRN

## 2013-03-18 MED ORDER — ONDANSETRON HCL 4 MG/2ML IJ SOLN
INTRAMUSCULAR | Status: DC | PRN
  Administered 2013-03-18: 4 mg via INTRAVENOUS

## 2013-03-18 MED ORDER — ENOXAPARIN SODIUM 40 MG/0.4ML ~~LOC~~ SOLN: 40.0000 mg | SUBCUTANEOUS | Status: DC

## 2013-03-18 MED ORDER — FENTANYL CITRATE 0.05 MG/ML IJ SOLN
50.0000 ug | INTRAMUSCULAR | Status: AC | PRN
  Administered 2013-03-18 (×2): 50 ug via INTRAVENOUS
  Filled 2013-03-18: qty 2

## 2013-03-18 MED ORDER — FLEET ENEMA 7-19 GM/118ML RE ENEM
1.0000 | ENEMA | Freq: Once | RECTAL | Status: AC | PRN
  Filled 2013-03-18: qty 1

## 2013-03-18 MED ORDER — FENTANYL CITRATE 0.05 MG/ML IJ SOLN: 50.0000 ug | Freq: Once | INTRAMUSCULAR | Status: DC

## 2013-03-18 MED ORDER — ENOXAPARIN SODIUM 40 MG/0.4ML ~~LOC~~ SOLN
40.0000 mg | SUBCUTANEOUS | Status: DC
  Administered 2013-03-19 – 2013-03-21 (×3): 40 mg via SUBCUTANEOUS
  Filled 2013-03-18 (×5): qty 0.4

## 2013-03-18 MED ORDER — MORPHINE SULFATE 2 MG/ML IJ SOLN: 0.5000 mg | INTRAMUSCULAR | Status: DC | PRN

## 2013-03-18 MED ORDER — CEFAZOLIN SODIUM-DEXTROSE 2-3 GM-% IV SOLR
2.0000 g | INTRAVENOUS | Status: DC
  Filled 2013-03-18: qty 50

## 2013-03-18 MED ORDER — PROPOFOL 10 MG/ML IV BOLUS
INTRAVENOUS | Status: DC | PRN
  Administered 2013-03-18: 100 mg via INTRAVENOUS

## 2013-03-18 MED ORDER — ROCURONIUM BROMIDE 100 MG/10ML IV SOLN
INTRAVENOUS | Status: DC | PRN
  Administered 2013-03-18: 10 mg via INTRAVENOUS

## 2013-03-18 MED ORDER — ONDANSETRON HCL 4 MG PO TABS: 4.0000 mg | ORAL_TABLET | Freq: Four times a day (QID) | ORAL | Status: DC | PRN

## 2013-03-18 MED ORDER — ACETAMINOPHEN 500 MG PO TABS: 500.0000 mg | ORAL_TABLET | Freq: Four times a day (QID) | ORAL | Status: DC | PRN

## 2013-03-18 MED ORDER — HYDROCODONE-ACETAMINOPHEN 5-325 MG PO TABS
1.0000 | ORAL_TABLET | Freq: Four times a day (QID) | ORAL | Status: DC | PRN
  Administered 2013-03-19 (×3): 1 via ORAL
  Administered 2013-03-20 (×2): 2 via ORAL
  Administered 2013-03-21 – 2013-03-22 (×5): 1 via ORAL
  Filled 2013-03-18 (×2): qty 1
  Filled 2013-03-18: qty 2
  Filled 2013-03-18 (×2): qty 1
  Filled 2013-03-18: qty 2
  Filled 2013-03-18 (×4): qty 1

## 2013-03-18 MED ORDER — METOCLOPRAMIDE HCL 5 MG/ML IJ SOLN
5.0000 mg | Freq: Three times a day (TID) | INTRAMUSCULAR | Status: DC | PRN
  Filled 2013-03-18: qty 2

## 2013-03-18 MED ORDER — POLYETHYLENE GLYCOL 3350 17 G PO PACK
17.0000 g | PACK | Freq: Every day | ORAL | Status: DC | PRN
  Filled 2013-03-18: qty 1

## 2013-03-18 MED ORDER — ACETAMINOPHEN 325 MG PO TABS
650.0000 mg | ORAL_TABLET | Freq: Four times a day (QID) | ORAL | Status: DC | PRN
  Administered 2013-03-19 – 2013-03-20 (×3): 650 mg via ORAL
  Filled 2013-03-18 (×3): qty 2

## 2013-03-18 MED ORDER — ONDANSETRON HCL 4 MG/2ML IJ SOLN
4.0000 mg | Freq: Once | INTRAMUSCULAR | Status: AC
  Administered 2013-03-18: 4 mg via INTRAVENOUS
  Filled 2013-03-18: qty 2

## 2013-03-18 MED ORDER — VANCOMYCIN HCL IN DEXTROSE 1-5 GM/200ML-% IV SOLN
INTRAVENOUS | Status: AC
  Administered 2013-03-18: 1000 mg via INTRAVENOUS
  Filled 2013-03-18: qty 200

## 2013-03-18 MED ORDER — ASPIRIN EC 81 MG PO TBEC: 81.0000 mg | DELAYED_RELEASE_TABLET | Freq: Every day | ORAL | Status: DC

## 2013-03-18 MED ORDER — ARTIFICIAL TEARS OP OINT
TOPICAL_OINTMENT | OPHTHALMIC | Status: DC | PRN
  Administered 2013-03-18: 1 via OPHTHALMIC

## 2013-03-18 MED ORDER — BISACODYL 5 MG PO TBEC: 5.0000 mg | DELAYED_RELEASE_TABLET | Freq: Every day | ORAL | Status: DC | PRN

## 2013-03-18 SURGICAL SUPPLY — 66 items
BANDAGE CONFORM 3  STR LF (GAUZE/BANDAGES/DRESSINGS) ×6 IMPLANT
BANDAGE ELASTIC 4 VELCRO ST LF (GAUZE/BANDAGES/DRESSINGS) ×1 IMPLANT
BANDAGE ELASTIC 6 VELCRO ST LF (GAUZE/BANDAGES/DRESSINGS) ×1 IMPLANT
BANDAGE ESMARK 6X9 LF (GAUZE/BANDAGES/DRESSINGS) IMPLANT
BANDAGE GAUZE ELAST BULKY 4 IN (GAUZE/BANDAGES/DRESSINGS) ×1 IMPLANT
BIT DRILL CANN LG 4.3MM (BIT) ×1 IMPLANT
BLADE SURG 15 STRL LF DISP TIS (BLADE) ×3 IMPLANT
BLADE SURG 15 STRL SS (BLADE) ×3
BLADE SURG ROTATE 9660 (MISCELLANEOUS) IMPLANT
BNDG CMPR 9X6 STRL LF SNTH (GAUZE/BANDAGES/DRESSINGS)
BNDG COHESIVE 6X5 TAN STRL LF (GAUZE/BANDAGES/DRESSINGS) ×1 IMPLANT
BNDG ESMARK 6X9 LF (GAUZE/BANDAGES/DRESSINGS)
CLOTH BEACON ORANGE TIMEOUT ST (SAFETY) ×4 IMPLANT
COVER SURGICAL LIGHT HANDLE (MISCELLANEOUS) ×2 IMPLANT
CUFF TOURNIQUET SINGLE 34IN LL (TOURNIQUET CUFF) IMPLANT
CUFF TOURNIQUET SINGLE 44IN (TOURNIQUET CUFF) IMPLANT
DRAPE C-ARM 42X72 X-RAY (DRAPES) ×1 IMPLANT
DRAPE ORTHO SPLIT 77X108 STRL (DRAPES)
DRAPE PROXIMA HALF (DRAPES) ×2 IMPLANT
DRAPE STERI IOBAN 125X83 (DRAPES) ×3 IMPLANT
DRAPE SURG ORHT 6 SPLT 77X108 (DRAPES) ×2 IMPLANT
DRAPE U-SHAPE 47X51 STRL (DRAPES) ×1 IMPLANT
DRILL BIT CANN LG 4.3MM (BIT) ×3
DRSG ADAPTIC 3X8 NADH LF (GAUZE/BANDAGES/DRESSINGS) ×2 IMPLANT
DRSG MEPILEX BORDER 4X4 (GAUZE/BANDAGES/DRESSINGS) ×4 IMPLANT
DRSG MEPILEX BORDER 4X8 (GAUZE/BANDAGES/DRESSINGS) ×3 IMPLANT
DURAPREP 26ML APPLICATOR (WOUND CARE) ×1 IMPLANT
ELECT REM PT RETURN 9FT ADLT (ELECTROSURGICAL) ×3
ELECTRODE REM PT RTRN 9FT ADLT (ELECTROSURGICAL) ×3 IMPLANT
GLOVE BIO SURGEON STRL SZ 6.5 (GLOVE) ×2 IMPLANT
GLOVE BIO SURGEON STRL SZ7.5 (GLOVE) ×4 IMPLANT
GLOVE BIO SURGEON STRL SZ8 (GLOVE) ×4 IMPLANT
GLOVE BIOGEL PI IND STRL 6.5 (GLOVE) ×1 IMPLANT
GLOVE BIOGEL PI INDICATOR 6.5 (GLOVE) ×1
GLOVE EUDERMIC 7 POWDERFREE (GLOVE) ×4 IMPLANT
GLOVE SS BIOGEL STRL SZ 7.5 (GLOVE) ×3 IMPLANT
GLOVE SUPERSENSE BIOGEL SZ 7.5 (GLOVE) ×1
GOWN STRL NON-REIN LRG LVL3 (GOWN DISPOSABLE) ×4 IMPLANT
GOWN STRL REIN XL XLG (GOWN DISPOSABLE) ×8 IMPLANT
GUIDEPIN 3.2X17.5 THRD DISP (PIN) ×2 IMPLANT
GUIDEWIRE BALL NOSE 100CM (WIRE) ×2 IMPLANT
KIT BASIN OR (CUSTOM PROCEDURE TRAY) ×4 IMPLANT
KIT ROOM TURNOVER OR (KITS) ×4 IMPLANT
MANIFOLD NEPTUNE II (INSTRUMENTS) ×2 IMPLANT
NAIL HIP FRACT 130D 9X180 (Orthopedic Implant) ×2 IMPLANT
NS IRRIG 1000ML POUR BTL (IV SOLUTION) ×4 IMPLANT
PACK GENERAL/GYN (CUSTOM PROCEDURE TRAY) ×4 IMPLANT
PAD ARMBOARD 7.5X6 YLW CONV (MISCELLANEOUS) ×8 IMPLANT
SCREW BONE CORTICAL 5.0X32 (Screw) ×2 IMPLANT
SCREW LAG 10.5MMX105MM HFN (Screw) ×2 IMPLANT
SPONGE GAUZE 4X4 12PLY (GAUZE/BANDAGES/DRESSINGS) ×2 IMPLANT
SPONGE LAP 18X18 X RAY DECT (DISPOSABLE) ×3 IMPLANT
STAPLER VISISTAT 35W (STAPLE) ×7 IMPLANT
STOCKINETTE IMPERVIOUS LG (DRAPES) ×1 IMPLANT
STRIP CLOSURE SKIN 1/2X4 (GAUZE/BANDAGES/DRESSINGS) ×3 IMPLANT
SUT MNCRL AB 3-0 PS2 18 (SUTURE) ×3 IMPLANT
SUT VIC AB 0 CT1 27 (SUTURE) ×3
SUT VIC AB 0 CT1 27XBRD ANBCTR (SUTURE) ×2 IMPLANT
SUT VIC AB 1 CT1 27 (SUTURE)
SUT VIC AB 1 CT1 27XBRD ANBCTR (SUTURE) ×1 IMPLANT
SUT VIC AB 2-0 CT1 27 (SUTURE) ×3
SUT VIC AB 2-0 CT1 TAPERPNT 27 (SUTURE) ×3 IMPLANT
TOWEL OR 17X24 6PK STRL BLUE (TOWEL DISPOSABLE) ×1 IMPLANT
TOWEL OR 17X26 10 PK STRL BLUE (TOWEL DISPOSABLE) ×1 IMPLANT
TRAY FOLEY CATH 16FRSI W/METER (SET/KITS/TRAYS/PACK) IMPLANT
WATER STERILE IRR 1000ML POUR (IV SOLUTION) ×4 IMPLANT

## 2013-03-18 NOTE — ED Provider Notes (Addendum)
CSN: [id number]     Arrival date & time 03/18/13  0055 History   First MD Initiated Contact with Patient 03/18/13 0056     Chief Complaint  Patient presents with  . Fall   (Consider location/radiation/quality/duration/timing/severity/associated sxs/prior Treatment) HPI Hx per PT and son bedside - resides at ALF, fell getting out of bed tinight injuring her R hip.  She also hit the right side of her head no LOC or neck pain.  She has RA and severe joint pains at baseline. Has some mid back pain. Pain sharp and mod to severe, no weakness or numbness. She was unable to walk after fall had to crawl for help and now hurts to move her RLE.   Past Medical History  Diagnosis Date  . Hypertension   . Dysrhythmia   . Fall 06/01/11    twice this year  . Incontinence of urine     wears depends  . 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 Psychoses 05/2011    Hallucinations and AMS in setting of low Na, UTI, and steroid use   . Stroke   . Seizures   . Rheumatoid arthritis(714.0)     With known cervical subluxation  . Encephalopathy, unspecified   . Anemia   . Generalized anxiety disorder   . Unspecified vitamin D deficiency   . Edema   . Unspecified hypothyroidism   . Unspecified constipation   . Insomnia, unspecified   . Dementia, unspecified, with behavioral disturbance   . Anxiety state, unspecified   . Coronary atherosclerosis of native coronary artery   . Unspecified late effects of cerebrovascular disease   . Osteoporosis, unspecified    Past Surgical History  Procedure Laterality Date  . Toe surgery      great toe bilateral feet due to arthrits  . Eus N/A 01/09/2013    Procedure: UPPER ENDOSCOPIC ULTRASOUND (EUS) LINEAR;  Surgeon: [name], MD;  Location: [facility];  Service: Endoscopy;  Laterality: N/A;  . Fine needle aspiration N/A 01/09/2013    Procedure: FINE NEEDLE ASPIRATION (FNA) LINEAR;  Surgeon: [name], MD;  Location: [facility];  Service:  Endoscopy;  Laterality: N/A;  . Eye surgery  2008    cataract-Dr. [name]   Family History  Problem Relation Age of Onset  . Congestive Heart Failure Mother    History  Substance Use Topics  . Smoking status: Never Smoker   . Smokeless tobacco: Never Used  . Alcohol Use: No   OB History   Grav Para Term Preterm Abortions TAB SAB Ect Mult Living                 Review of Systems  Constitutional: Negative for fever and chills.  HENT: Negative for neck pain.   Eyes: Negative for visual disturbance.  Respiratory: Negative for shortness of breath.   Cardiovascular: Negative for chest pain.  Gastrointestinal: Negative for vomiting and abdominal pain.  Genitourinary: Negative for dysuria.  Musculoskeletal: Positive for back pain.  Skin: Negative for rash.  Neurological: Negative for headaches.  All other systems reviewed and are negative.    Allergies  Hydrochlorothiazide; Leflunomide; Propoxyphene and methadone; Remicade; Zinc; Colchicine; Famotidine; and Penicillins  Home Medications   Current Outpatient Rx  Name  Route  Sig  Dispense  Refill  . acetaminophen (TYLENOL) 500 MG tablet   Oral   Take 500 mg by mouth every 6 (six) hours as needed for pain.         Marland Kitchen  alum & mag hydroxide-simeth (MAALOX/MYLANTA) 200-200-20 MG/5ML suspension   Oral   Take 30 mLs by mouth every 6 (six) hours as needed for indigestion.         Marland Kitchen amLODipine (NORVASC) 5 MG tablet   Oral   Take 1 tablet (5 mg total) by mouth daily.   30 tablet   1   . bisacodyl (DULCOLAX) 10 MG suppository   Rectal   Place 1 suppository (10 mg total) rectally daily as needed.   12 suppository   0   . diphenhydramine-acetaminophen (TYLENOL PM) 25-500 MG TABS   Oral   Take 1 tablet by mouth at bedtime as needed (for insomnia).         Marland Kitchen guaiFENesin (ROBITUSSIN) 100 MG/5ML SOLN   Oral   Take 5 mLs by mouth every 6 (six) hours as needed (for cough).         Marland Kitchen levothyroxine (SYNTHROID,  LEVOTHROID) 25 MCG tablet   Oral   Take 25 mcg by mouth daily before breakfast.         . loperamide (IMODIUM) 2 MG capsule   Oral   Take 2 mg by mouth every 3 (three) hours as needed (with each loose stool).         . magnesium hydroxide (MILK OF MAGNESIA) 400 MG/5ML suspension   Oral   Take 30 mLs by mouth at bedtime as needed for constipation.         Marland Kitchen OLANZapine (ZYPREXA) 10 MG tablet   Oral   Take 10 mg by mouth daily.         . polyethylene glycol (MIRALAX / GLYCOLAX) packet   Oral   Take 17 g by mouth every 3 (three) days.         . sodium chloride 1 G tablet   Oral   Take 1 g by mouth 3 (three) times daily.         Marland Kitchen torsemide (DEMADEX) 20 MG tablet   Oral   Take 20 mg by mouth daily.          BP 160/80  Pulse 107  Temp(Src) 98.4 F (36.9 C) (Oral)  Resp 16  SpO2 97% Physical Exam  Nursing note and vitals reviewed. Constitutional: She is oriented to person, place, and time. She appears well-developed and well-nourished.  HENT:  Head: Normocephalic.  Mild tenderness R parietal no laceration or sig swelling. TMs clear.   Eyes: EOM are normal. Pupils are equal, round, and reactive to light.  Neck:  No midline cervical tenderness or deformity  Cardiovascular: Regular rhythm, normal heart sounds and intact distal pulses.   Mild tachycardia  Pulmonary/Chest: Effort normal and breath sounds normal. No respiratory distress. She exhibits no tenderness.  Abdominal: Soft. Bowel sounds are normal. She exhibits no distension.  Musculoskeletal:  Pelvis stable: tender over R hip with ext rotation of RLE, distal N/V intact. No UE injury has chronic changes of RA both hands.   Neurological: She is alert and oriented to person, place, and time.  Skin: Skin is warm and dry.    ED Course  Procedures (including critical care time) Labs Review Labs Reviewed  BASIC METABOLIC PANEL - Abnormal; Notable for the following:    Potassium 3.3 (*)    Glucose, Bld  124 (*)    Creatinine, Ser 0.47 (*)    All other components within normal limits  CBC WITH DIFFERENTIAL - Abnormal; Notable for the following:    Hemoglobin 11.4 (*)  HCT 33.8 (*)    All other components within normal limits  PROTIME-INR  TYPE AND SCREEN  ABO/RH   Imaging Review Dg Chest 1 View  03/18/2013   CLINICAL DATA:  Fall with pain  EXAM: CHEST - 1 VIEW  COMPARISON:  01/05/2013  FINDINGS: No cardiomegaly. Unchanged aortic tortuosity. Coronary arterial stenting.  Unchanged elevation of the left diaphragm. No infiltrate, edema, effusion, or pneumothorax. No evident fracture.  IMPRESSION: 1. No evidence of acute cardiopulmonary disease. 2. No evident fracture.   Electronically Signed   By: Tiburcio Pea   On: 03/18/2013 02:49   Dg Thoracic Spine 2 View  03/18/2013   CLINICAL DATA:  Fall with back and leg pain.  EXAM: THORACIC SPINE - 2 VIEW  COMPARISON:  CHEST x-ray 06/14/2011.  FINDINGS: Remote T11 superior endplate compression deformity. No acute fracture identified. No subluxation. Mild dextro curvature of the upper thoracic spine. Osteopenia.  IMPRESSION: 1. No acute fracture detected. 2. Remote T11 compression fracture.   Electronically Signed   By: Tiburcio Pea   On: 03/18/2013 02:51   Dg Hip Complete Right  03/18/2013   CLINICAL DATA:  Fall with right leg pain  EXAM: RIGHT HIP - COMPLETE 2+ VIEW  COMPARISON:  None.  FINDINGS: There is a lucency through the proximal right femur, involving the greater trochanter at least, extending distally towards the shaft and lesser trochanter. No displacement is evident. The femoral head is located. No evidence of acute pelvic ring fracture, although there is distortion by rightward rotation.  IMPRESSION: Proximal right femur fracture involving the greater trochanter. The fracture is likely intertrochanteric, but the exact extent is uncertain, suggest MRI or CT.   Electronically Signed   By: Tiburcio Pea   On: 03/18/2013 02:47   Ct Head Wo  Contrast  03/18/2013   CLINICAL DATA:  Status post fall.  EXAM: CT HEAD WITHOUT CONTRAST  CT CERVICAL SPINE WITHOUT CONTRAST  TECHNIQUE: Multidetector CT imaging of the head and cervical spine was performed following the standard protocol without intravenous contrast. Multiplanar CT image reconstructions of the cervical spine were also generated.  COMPARISON:  Head and cervical spine CT scan 01/04/2013 and 10/24/2011.  FINDINGS: CT HEAD FINDINGS  Again seen are cortical atrophy and chronic microvascular ischemic change. No evidence of acute intracranial abnormality including infarction, hemorrhage, mass lesion, mass effect, midline shift or abnormal extra-axial fluid collection is identified. No pneumocephalus or hydrocephalus. Calvarium intact.  CT CERVICAL SPINE FINDINGS  No fracture subluxation of the cervical spine is identified. Marked widening of the atlantodental interval is unchanged. Paraspinous soft tissue structures are unremarkable. The lung apices are clear  IMPRESSION: CT HEAD IMPRESSION  No acute intracranial abnormality. Stable compared to prior exam.  CT CERVICAL SPINE IMPRESSION  Negative for fracture or subluxation. Chronic widening of the atlantodental interval is unchanged.   Electronically Signed   By: Drusilla Kanner M.D.   On: 03/18/2013 02:53   Ct Cervical Spine Wo Contrast  03/18/2013   CLINICAL DATA:  Status post fall.  EXAM: CT HEAD WITHOUT CONTRAST  CT CERVICAL SPINE WITHOUT CONTRAST  TECHNIQUE: Multidetector CT imaging of the head and cervical spine was performed following the standard protocol without intravenous contrast. Multiplanar CT image reconstructions of the cervical spine were also generated.  COMPARISON:  Head and cervical spine CT scan 01/04/2013 and 10/24/2011.  FINDINGS: CT HEAD FINDINGS  Again seen are cortical atrophy and chronic microvascular ischemic change. No evidence of acute intracranial abnormality including infarction, hemorrhage,  mass lesion, mass effect,  midline shift or abnormal extra-axial fluid collection is identified. No pneumocephalus or hydrocephalus. Calvarium intact.  CT CERVICAL SPINE FINDINGS  No fracture subluxation of the cervical spine is identified. Marked widening of the atlantodental interval is unchanged. Paraspinous soft tissue structures are unremarkable. The lung apices are clear  IMPRESSION: CT HEAD IMPRESSION  No acute intracranial abnormality. Stable compared to prior exam.  CT CERVICAL SPINE IMPRESSION  Negative for fracture or subluxation. Chronic widening of the atlantodental interval is unchanged.   Electronically Signed   By: Drusilla Kanner M.D.   On: 03/18/2013 02:53   IV fentanyl pain control, imaging as above, MED consult for admit, Ortho notified  4:27 AM d/w Dr Rennis Chris as above, does not rec any further imaging at this time, will evaluate patient. MED admit.  MDM  Dx: R hip fracture  Labs, CT scans, xray ECG Ortho notified MED admit    Sunnie Nielsen, MD 03/18/13 0428   Date: 03/18/2013  Rate: 84  Rhythm: normal sinus rhythm  QRS Axis: normal  Intervals: normal  ST/T Wave abnormalities: nonspecific ST changes  Conduction Disutrbances:none  Narrative Interpretation:   Old EKG Reviewed: unchanged    Sunnie Nielsen, MD 03/18/13 0502

## 2013-03-18 NOTE — Op Note (Signed)
03/18/2013  1:13 PM  PATIENT:   [name]  79 y.o. female  PRE-OPERATIVE DIAGNOSIS:  Right  Inter-trochanteric Hip Fracture  POST-OPERATIVE DIAGNOSIS:  same  PROCEDURE:  ORIF with IMHS  SURGEON:  Emry Tobin, Vania Rea M.D.  ASSISTANTS: Shuford pac   ANESTHESIA:   GET  EBL: 200  SPECIMEN:  none  Drains: none   PATIENT DISPOSITION:  PACU - hemodynamically stable.    PLAN OF CARE: Admit to inpatient  WBAT RLE ASA for post op DVT prophylaxis as out[atient, lovenox inpatient F/U 2-3 weeks  Dictation# [id number]

## 2013-03-18 NOTE — Transfer of Care (Signed)
Immediate Anesthesia Transfer of Care Note  Patient: [name]  Procedure(s) Performed: Procedure(s): INTRAMEDULLARY (IM) NAIL FEMORAL (Right)  Patient Location: PACU  Anesthesia Type:General  Level of Consciousness: awake, alert , oriented and patient cooperative  Airway & Oxygen Therapy: Patient Spontanous Breathing and Patient connected to nasal cannula oxygen  Post-op Assessment: Report given to PACU RN, Post -op Vital signs reviewed and stable and Patient moving all extremities X 4  Post vital signs: Reviewed and stable  Complications: No apparent anesthesia complications

## 2013-03-18 NOTE — Anesthesia Postprocedure Evaluation (Signed)
Anesthesia Post Note  Patient: [name]  Procedure(s) Performed: Procedure(s) (LRB): INTRAMEDULLARY (IM) NAIL FEMORAL (Right)  Anesthesia type: General  Patient location: PACU  Post pain: Pain level controlled and Adequate analgesia  Post assessment: Post-op Vital signs reviewed, Patient's Cardiovascular Status Stable, Respiratory Function Stable, Patent Airway and Pain level controlled  Last Vitals:  Filed Vitals:   03/18/13 1404  BP: 161/78  Pulse: 89  Temp:   Resp: 26    Post vital signs: Reviewed and stable  Level of consciousness: awake, alert  and oriented  Complications: No apparent anesthesia complications

## 2013-03-18 NOTE — Progress Notes (Signed)
[facility] PROGRESS NOTE  [name] BJY:[id number] DOB: [birth year] DOA: 03/18/2013 PCP: Bufford Spikes, DO  Assessment/Plan: 1-Right hip Fracture: Patient at moderate risk for surgery. She denies chest pain, dyspnea at rest or on exertion. She has history of RA with known cervical subluxation.   2-History of dementia and behavioral disturbance: Patient at baseline. \ 3-Hypokalemia: will replete with 10 Meq IV times 3 runs.  4-Hypothyroidism: continue with synthroid.  5-Left Hip Pain: will check Complete left hip x ray.  6-Anemia: monitor.   Code Status: Full Code.  Family Communication: Care discussed with patient.  Disposition Plan: SNF at discharge.    Consultants:  Dr Rennis Chris, ortho  Procedures:  none  Antibiotics:  none  HPI/Subjective: Patient complaining of Left Hip pain also.   Objective: Filed Vitals:   03/18/13 0501  BP: 135/68  Pulse: 94  Temp:   Resp:     Intake/Output Summary (Last 24 hours) at 03/18/13 0827 Last data filed at 03/18/13 0700  Gross per 24 hour  Intake      0 ml  Output    100 ml  Net   -100 ml   Filed Weights   03/18/13 0501  Weight: 60.8 kg (134 lb 0.6 oz)    Exam:   General:  No acute distress.   Cardiovascular: S 1, S 2 RRR  Respiratory: CTA  Abdomen: BS present, soft, NT  Musculoskeletal: no edema  Data Reviewed: Basic Metabolic Panel:  Recent Labs Lab 03/18/13 0155  NA 135  K 3.3*  CL 96  CO2 29  GLUCOSE 124*  BUN 11  CREATININE 0.47*  CALCIUM 9.2   Liver Function Tests: No results found for this basename: AST, ALT, ALKPHOS, BILITOT, PROT, ALBUMIN,  in the last 168 hours No results found for this basename: LIPASE, AMYLASE,  in the last 168 hours No results found for this basename: AMMONIA,  in the last 168 hours CBC:  Recent Labs Lab 03/18/13 0155  WBC 6.0  NEUTROABS 3.6  HGB 11.4*  HCT 33.8*  MCV 86.0  PLT 297   Cardiac Enzymes: No results found for this basename: CKTOTAL,  CKMB, CKMBINDEX, TROPONINI,  in the last 168 hours BNP (last 3 results) No results found for this basename: PROBNP,  in the last 8760 hours CBG: No results found for this basename: GLUCAP,  in the last 168 hours  Recent Results (from the past 240 hour(s))  MRSA PCR SCREENING     Status: None   Collection Time    03/18/13  5:12 AM      Result Value Range Status   MRSA by PCR NEGATIVE  NEGATIVE Final   Comment:            The GeneXpert MRSA Assay (FDA     approved for NASAL specimens     only), is one component of a     comprehensive MRSA colonization     surveillance program. It is not     intended to diagnose MRSA     infection nor to guide or     monitor treatment for     MRSA infections.     Studies: Dg Chest 1 View  03/18/2013   CLINICAL DATA:  Fall with pain  EXAM: CHEST - 1 VIEW  COMPARISON:  01/05/2013  FINDINGS: No cardiomegaly. Unchanged aortic tortuosity. Coronary arterial stenting.  Unchanged elevation of the left diaphragm. No infiltrate, edema, effusion, or pneumothorax. No evident fracture.  IMPRESSION: 1. No evidence of acute  cardiopulmonary disease. 2. No evident fracture.   Electronically Signed   By: Tiburcio Pea   On: 03/18/2013 02:49   Dg Thoracic Spine 2 View  03/18/2013   CLINICAL DATA:  Fall with back and leg pain.  EXAM: THORACIC SPINE - 2 VIEW  COMPARISON:  CHEST x-ray 06/14/2011.  FINDINGS: Remote T11 superior endplate compression deformity. No acute fracture identified. No subluxation. Mild dextro curvature of the upper thoracic spine. Osteopenia.  IMPRESSION: 1. No acute fracture detected. 2. Remote T11 compression fracture.   Electronically Signed   By: Tiburcio Pea   On: 03/18/2013 02:51   Dg Hip Complete Right  03/18/2013   CLINICAL DATA:  Fall with right leg pain  EXAM: RIGHT HIP - COMPLETE 2+ VIEW  COMPARISON:  None.  FINDINGS: There is a lucency through the proximal right femur, involving the greater trochanter at least, extending distally  towards the shaft and lesser trochanter. No displacement is evident. The femoral head is located. No evidence of acute pelvic ring fracture, although there is distortion by rightward rotation.  IMPRESSION: Proximal right femur fracture involving the greater trochanter. The fracture is likely intertrochanteric, but the exact extent is uncertain, suggest MRI or CT.   Electronically Signed   By: Tiburcio Pea   On: 03/18/2013 02:47   Ct Head Wo Contrast  03/18/2013   CLINICAL DATA:  Status post fall.  EXAM: CT HEAD WITHOUT CONTRAST  CT CERVICAL SPINE WITHOUT CONTRAST  TECHNIQUE: Multidetector CT imaging of the head and cervical spine was performed following the standard protocol without intravenous contrast. Multiplanar CT image reconstructions of the cervical spine were also generated.  COMPARISON:  Head and cervical spine CT scan 01/04/2013 and 10/24/2011.  FINDINGS: CT HEAD FINDINGS  Again seen are cortical atrophy and chronic microvascular ischemic change. No evidence of acute intracranial abnormality including infarction, hemorrhage, mass lesion, mass effect, midline shift or abnormal extra-axial fluid collection is identified. No pneumocephalus or hydrocephalus. Calvarium intact.  CT CERVICAL SPINE FINDINGS  No fracture subluxation of the cervical spine is identified. Marked widening of the atlantodental interval is unchanged. Paraspinous soft tissue structures are unremarkable. The lung apices are clear  IMPRESSION: CT HEAD IMPRESSION  No acute intracranial abnormality. Stable compared to prior exam.  CT CERVICAL SPINE IMPRESSION  Negative for fracture or subluxation. Chronic widening of the atlantodental interval is unchanged.   Electronically Signed   By: Drusilla Kanner M.D.   On: 03/18/2013 02:53   Ct Cervical Spine Wo Contrast  03/18/2013   CLINICAL DATA:  Status post fall.  EXAM: CT HEAD WITHOUT CONTRAST  CT CERVICAL SPINE WITHOUT CONTRAST  TECHNIQUE: Multidetector CT imaging of the head and  cervical spine was performed following the standard protocol without intravenous contrast. Multiplanar CT image reconstructions of the cervical spine were also generated.  COMPARISON:  Head and cervical spine CT scan 01/04/2013 and 10/24/2011.  FINDINGS: CT HEAD FINDINGS  Again seen are cortical atrophy and chronic microvascular ischemic change. No evidence of acute intracranial abnormality including infarction, hemorrhage, mass lesion, mass effect, midline shift or abnormal extra-axial fluid collection is identified. No pneumocephalus or hydrocephalus. Calvarium intact.  CT CERVICAL SPINE FINDINGS  No fracture subluxation of the cervical spine is identified. Marked widening of the atlantodental interval is unchanged. Paraspinous soft tissue structures are unremarkable. The lung apices are clear  IMPRESSION: CT HEAD IMPRESSION  No acute intracranial abnormality. Stable compared to prior exam.  CT CERVICAL SPINE IMPRESSION  Negative for fracture or subluxation. Chronic  widening of the atlantodental interval is unchanged.   Electronically Signed   By: Drusilla Kanner M.D.   On: 03/18/2013 02:53    Scheduled Meds: . enoxaparin (LOVENOX) injection  40 mg Subcutaneous Q24H  . levothyroxine  12.5 mcg Intravenous QAC breakfast  . sodium chloride  3 mL Intravenous Q12H   Continuous Infusions: . 0.9 % NaCl with KCl 20 mEq / L 75 mL/hr at 03/18/13 1610    Principal Problem:   Hip fracture, right Active Problems:   HYPERTENSION   Hypothyroidism    Time spent: 25 minutes.     Ankush Gintz  [facility] Pager [phone]. If 7PM-7AM, please contact night-coverage at [email or web address], password [facility] 03/18/2013, 8:27 AM  LOS: 0 days

## 2013-03-18 NOTE — H&P (Signed)
[facility] ADMISSION H&P  Chief Complaint: Fall  HPI: [name] w/ pmhx significant for HTN, urinary incontinence, RA, hypothyroidism, osteoporosis, hx psychoses, presents due to fall. The patient is a poor historian, but states she just "slipped" from her bed. She denies any LOC, CP, SOB.  Per report, she comes from [facility] after having this fall around midnight.  She was noted to have pain in her RLE and external rotation of the distal RLE.   Currently, she admits to mild pain around right hip.  She denies any SOB, CP, dizziness, abdominal pain.  She had a hip xray performed in the ED which showed evidence of a proximal right femur fracture involving the greater trochanter, likely intertrochanteric.  Dr. Dierdre Highman has notified orthopedics on call.  Past Medical History  Diagnosis Date  . Hypertension   . Dysrhythmia   . Fall 06/01/11    twice this year  . Incontinence of urine     wears depends  . 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 Psychoses 05/2011    Hallucinations and AMS in setting of low Na, UTI, and steroid use   . Stroke   . Seizures   . Rheumatoid arthritis(714.0)     With known cervical subluxation  . Encephalopathy, unspecified   . Anemia   . Generalized anxiety disorder   . Unspecified vitamin D deficiency   . Edema   . Unspecified hypothyroidism   . Unspecified constipation   . Insomnia, unspecified   . Dementia, unspecified, with behavioral disturbance   . Anxiety state, unspecified   . Coronary atherosclerosis of native coronary artery   . Unspecified late effects of cerebrovascular disease   . Osteoporosis, unspecified     Past Surgical History  Procedure Laterality Date  . Toe surgery      great toe bilateral feet due to arthrits  . Eus N/A 01/09/2013    Procedure: UPPER ENDOSCOPIC ULTRASOUND (EUS) LINEAR;  Surgeon: [name], MD;  Location: [facility];  Service: Endoscopy;  Laterality: N/A;  . Fine needle aspiration  N/A 01/09/2013    Procedure: FINE NEEDLE ASPIRATION (FNA) LINEAR;  Surgeon: [name], MD;  Location: [facility];  Service: Endoscopy;  Laterality: N/A;  . Eye surgery  2008    cataract-Dr. [name]    Family History  Problem Relation Age of Onset  . Congestive Heart Failure Mother    Social History:  reports that she has never smoked. She has never used smokeless tobacco. She reports that she does not drink alcohol or use illicit drugs.  Allergies:  Allergies  Allergen Reactions  . Hydrochlorothiazide   . Leflunomide   . Propoxyphene And Methadone   . Remicade [Infliximab]   . Zinc   . Colchicine Anxiety  . Famotidine Anxiety  . Penicillins Anxiety     (Not in a hospital admission)  Results for orders placed during the hospital encounter of 03/18/13 (from the past 48 hour(s))  TYPE AND SCREEN     Status: None   Collection Time    03/18/13  1:50 AM      Result Value Range   ABO/RH(D) A POS     Antibody Screen NEG     Sample Expiration 03/21/2013    ABO/RH     Status: None   Collection Time    03/18/13  1:50 AM      Result Value Range   ABO/RH(D) A POS    BASIC  METABOLIC PANEL     Status: Abnormal   Collection Time    03/18/13  1:55 AM      Result Value Range   Sodium 135  135 - 145 mEq/L   Potassium 3.3 (*) 3.5 - 5.1 mEq/L   Chloride 96  96 - 112 mEq/L   CO2 29  19 - 32 mEq/L   Glucose, Bld 124 (*) 70 - 99 mg/dL   BUN 11  6 - 23 mg/dL   Creatinine, Ser 1.61 (*) 0.50 - 1.10 mg/dL   Calcium 9.2  8.4 - 09.6 mg/dL   GFR calc non Af Amer >90  >90 mL/min   GFR calc Af Amer >90  >90 mL/min   Comment: (NOTE)     The eGFR has been calculated using the CKD EPI equation.     This calculation has not been validated in all clinical situations.     eGFR's persistently <90 mL/min signify possible Chronic Kidney     Disease.  CBC WITH DIFFERENTIAL     Status: Abnormal   Collection Time    03/18/13  1:55 AM      Result Value Range   WBC 6.0  4.0 - 10.5 K/uL   RBC  3.93  3.87 - 5.11 MIL/uL   Hemoglobin 11.4 (*) 12.0 - 15.0 g/dL   HCT 04.5 (*) 40.9 - 81.1 %   MCV 86.0  78.0 - 100.0 fL   MCH 29.0  26.0 - 34.0 pg   MCHC 33.7  30.0 - 36.0 g/dL   RDW 91.4  78.2 - 95.6 %   Platelets 297  150 - 400 K/uL   Neutrophils Relative % 61  43 - 77 %   Neutro Abs 3.6  1.7 - 7.7 K/uL   Lymphocytes Relative 27  12 - 46 %   Lymphs Abs 1.6  0.7 - 4.0 K/uL   Monocytes Relative 9  3 - 12 %   Monocytes Absolute 0.6  0.1 - 1.0 K/uL   Eosinophils Relative 3  0 - 5 %   Eosinophils Absolute 0.2  0.0 - 0.7 K/uL   Basophils Relative 0  0 - 1 %   Basophils Absolute 0.0  0.0 - 0.1 K/uL  PROTIME-INR     Status: None   Collection Time    03/18/13  1:55 AM      Result Value Range   Prothrombin Time 12.5  11.6 - 15.2 seconds   INR 0.95  0.00 - 1.49   Dg Chest 1 View  03/18/2013   CLINICAL DATA:  Fall with pain  EXAM: CHEST - 1 VIEW  COMPARISON:  01/05/2013  FINDINGS: No cardiomegaly. Unchanged aortic tortuosity. Coronary arterial stenting.  Unchanged elevation of the left diaphragm. No infiltrate, edema, effusion, or pneumothorax. No evident fracture.  IMPRESSION: 1. No evidence of acute cardiopulmonary disease. 2. No evident fracture.   Electronically Signed   By: Tiburcio Pea   On: 03/18/2013 02:49   Dg Thoracic Spine 2 View  03/18/2013   CLINICAL DATA:  Fall with back and leg pain.  EXAM: THORACIC SPINE - 2 VIEW  COMPARISON:  CHEST x-ray 06/14/2011.  FINDINGS: Remote T11 superior endplate compression deformity. No acute fracture identified. No subluxation. Mild dextro curvature of the upper thoracic spine. Osteopenia.  IMPRESSION: 1. No acute fracture detected. 2. Remote T11 compression fracture.   Electronically Signed   By: Tiburcio Pea   On: 03/18/2013 02:51   Dg Hip Complete Right  03/18/2013  CLINICAL DATA:  Fall with right leg pain  EXAM: RIGHT HIP - COMPLETE 2+ VIEW  COMPARISON:  None.  FINDINGS: There is a lucency through the proximal right femur, involving the  greater trochanter at least, extending distally towards the shaft and lesser trochanter. No displacement is evident. The femoral head is located. No evidence of acute pelvic ring fracture, although there is distortion by rightward rotation.  IMPRESSION: Proximal right femur fracture involving the greater trochanter. The fracture is likely intertrochanteric, but the exact extent is uncertain, suggest MRI or CT.   Electronically Signed   By: Tiburcio Pea   On: 03/18/2013 02:47   Ct Head Wo Contrast  03/18/2013   CLINICAL DATA:  Status post fall.  EXAM: CT HEAD WITHOUT CONTRAST  CT CERVICAL SPINE WITHOUT CONTRAST  TECHNIQUE: Multidetector CT imaging of the head and cervical spine was performed following the standard protocol without intravenous contrast. Multiplanar CT image reconstructions of the cervical spine were also generated.  COMPARISON:  Head and cervical spine CT scan 01/04/2013 and 10/24/2011.  FINDINGS: CT HEAD FINDINGS  Again seen are cortical atrophy and chronic microvascular ischemic change. No evidence of acute intracranial abnormality including infarction, hemorrhage, mass lesion, mass effect, midline shift or abnormal extra-axial fluid collection is identified. No pneumocephalus or hydrocephalus. Calvarium intact.  CT CERVICAL SPINE FINDINGS  No fracture subluxation of the cervical spine is identified. Marked widening of the atlantodental interval is unchanged. Paraspinous soft tissue structures are unremarkable. The lung apices are clear  IMPRESSION: CT HEAD IMPRESSION  No acute intracranial abnormality. Stable compared to prior exam.  CT CERVICAL SPINE IMPRESSION  Negative for fracture or subluxation. Chronic widening of the atlantodental interval is unchanged.   Electronically Signed   By: Drusilla Kanner M.D.   On: 03/18/2013 02:53   Ct Cervical Spine Wo Contrast  03/18/2013   CLINICAL DATA:  Status post fall.  EXAM: CT HEAD WITHOUT CONTRAST  CT CERVICAL SPINE WITHOUT CONTRAST   TECHNIQUE: Multidetector CT imaging of the head and cervical spine was performed following the standard protocol without intravenous contrast. Multiplanar CT image reconstructions of the cervical spine were also generated.  COMPARISON:  Head and cervical spine CT scan 01/04/2013 and 10/24/2011.  FINDINGS: CT HEAD FINDINGS  Again seen are cortical atrophy and chronic microvascular ischemic change. No evidence of acute intracranial abnormality including infarction, hemorrhage, mass lesion, mass effect, midline shift or abnormal extra-axial fluid collection is identified. No pneumocephalus or hydrocephalus. Calvarium intact.  CT CERVICAL SPINE FINDINGS  No fracture subluxation of the cervical spine is identified. Marked widening of the atlantodental interval is unchanged. Paraspinous soft tissue structures are unremarkable. The lung apices are clear  IMPRESSION: CT HEAD IMPRESSION  No acute intracranial abnormality. Stable compared to prior exam.  CT CERVICAL SPINE IMPRESSION  Negative for fracture or subluxation. Chronic widening of the atlantodental interval is unchanged.   Electronically Signed   By: Drusilla Kanner M.D.   On: 03/18/2013 02:53    Review of Systems  Constitutional: Negative for fever, chills, weight loss and malaise/fatigue.  Eyes: Negative for blurred vision and photophobia.  Respiratory: Negative for cough, sputum production and shortness of breath.   Cardiovascular: Negative for chest pain, palpitations, orthopnea, leg swelling and PND.  Gastrointestinal: Negative for nausea, vomiting, abdominal pain, blood in stool and melena.  Genitourinary: Negative for dysuria.  Musculoskeletal: Positive for joint pain.  Neurological: Negative for dizziness, loss of consciousness and headaches.    Blood pressure 160/80, pulse 107, temperature  98.4 F (36.9 C), temperature source Oral, resp. rate 16, SpO2 97.00%. Physical Exam  Constitutional: She appears well-developed and well-nourished. No  distress.  HENT:  Head: Normocephalic and atraumatic.  Eyes: Conjunctivae and EOM are normal. Pupils are equal, round, and reactive to light.  Neck: Normal range of motion. Neck supple. No JVD present. No thyromegaly present.  Cardiovascular: Normal rate, regular rhythm and normal heart sounds.  Exam reveals no friction rub.   No murmur heard. Respiratory: Effort normal and breath sounds normal. No respiratory distress.  GI: Soft. Bowel sounds are normal. She exhibits no distension. There is no tenderness. There is no guarding.  Musculoskeletal: She exhibits tenderness.       Right hip: She exhibits tenderness and bony tenderness.       Legs: Neurological: She is alert.  Skin: Skin is warm. She is not diaphoretic.  Psychiatric: She has a normal mood and affect.     Assessment/Plan [name] w/ pmhx significant for HTN, urinary incontinence, RA, hypothyroidism, osteoporosis, hx psychoses, presents due to fall, with likely right intertrochanteric hip fracture. 1) Hip fracture: Appears mechanical from history.  Will need ortho eval for consideration of pinning. She appears to be a low perioperative risk for cardiac complications. 2) hx psychosis: will need to be monitored. Needs to resume olanzapine once able to take PO per ortho. 3) Hypothyroidism: Converted to IV synthroid dose. 4) FEN: IVF, will add K to fluids due to hypokalemia. 5) Proph: Lovenox 6) Code: FULL  Jonah Blue, DO, FACP 03/18/2013, 3:59 AM

## 2013-03-18 NOTE — Preoperative (Signed)
Beta Blockers   Reason not to administer Beta Blockers:Not Applicable 

## 2013-03-18 NOTE — OR Nursing (Signed)
Called Dr. Dub Mikes office about swelling in R thigh. Spoke with Shauna Hugh PAC, informed her of how leg looked, stated that she expects there to be some swelling there and that I could wrap it with an ACE wrap and/or have Dr. Ranell Patrick look at it when he comes out of surgery.

## 2013-03-18 NOTE — Progress Notes (Signed)
Orthopedic Tech Progress Note Patient Details:  [name] May 16, 1930 [id number]  Patient ID: [name], female   DOB: [birth year], 79 y.o.   MRN: [id number] Trapeze bar patient helper  Nikki Dom 03/18/2013, 4:37 PM

## 2013-03-18 NOTE — ED Notes (Signed)
Per EMS: Patient coming from [facility] after fall around 00:00. Patient has shortening and external rotation in her RLE. Pt has pain in the proximal RLE during palpation. NAD, AX4. Pt has dementia. Vitals: BP-155/94, Hr-106, R-16, & O-97.

## 2013-03-18 NOTE — Progress Notes (Signed)
Utilization review complete. Isidoro Donning RN CCM Case Mgmt phone [phone]

## 2013-03-18 NOTE — Anesthesia Preprocedure Evaluation (Signed)
Anesthesia Evaluation  Patient identified by MRN, date of birth, ID band Patient awake    Reviewed: Allergy & Precautions, H&P , NPO status , Patient's Chart, lab work & pertinent test results  Airway Mallampati: I TM Distance: >3 FB Neck ROM: Full    Dental   Pulmonary  + rhonchi         Cardiovascular hypertension, + CAD + dysrhythmias Rhythm:Regular Rate:Normal     Neuro/Psych Seizures -,  Anxiety Encephalopathy dementia CVA    GI/Hepatic   Endo/Other  Hypothyroidism   Renal/GU      Musculoskeletal  (+) Arthritis -, Rheumatoid disorders,    Abdominal   Peds  Hematology   Anesthesia Other Findings   Reproductive/Obstetrics                           Anesthesia Physical Anesthesia Plan  ASA: IV  Anesthesia Plan: General   Post-op Pain Management:    Induction: Intravenous  Airway Management Planned: Oral ETT  Additional Equipment:   Intra-op Plan:   Post-operative Plan: Extubation in OR  Informed Consent: I have reviewed the patients History and Physical, chart, labs and discussed the procedure including the risks, benefits and alternatives for the proposed anesthesia with the patient or authorized representative who has indicated his/her understanding and acceptance.     Plan Discussed with: CRNA and Surgeon  Anesthesia Plan Comments:         Anesthesia Quick Evaluation

## 2013-03-18 NOTE — Consult Note (Signed)
Reason for Consult:Right hip pain Referring Physician: Dr. Harlen Labs  HPI: [name] is an 79 y.o. female who fell in facility while getting out of bed. She had immediate complaints of pain and radiographs demonstrated right hip fracture nondisplaced. We were called for surgical management. Pt somewhat of poor historian. Now with new complaints of left hip pain as well, Medicine ordered full hip radiographs.  Past Medical History  Diagnosis Date  . Hypertension   . Dysrhythmia   . Fall 06/01/11    twice this year  . Incontinence of urine     wears depends  . 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 Psychoses 05/2011    Hallucinations and AMS in setting of low Na, UTI, and steroid use   . Stroke   . Seizures   . Rheumatoid arthritis(714.0)     With known cervical subluxation  . Encephalopathy, unspecified   . Anemia   . Generalized anxiety disorder   . Unspecified vitamin D deficiency   . Edema   . Unspecified hypothyroidism   . Unspecified constipation   . Insomnia, unspecified   . Dementia, unspecified, with behavioral disturbance   . Anxiety state, unspecified   . Coronary atherosclerosis of native coronary artery   . Unspecified late effects of cerebrovascular disease   . Osteoporosis, unspecified     Past Surgical History  Procedure Laterality Date  . Toe surgery      great toe bilateral feet due to arthrits  . Eus N/A 01/09/2013    Procedure: UPPER ENDOSCOPIC ULTRASOUND (EUS) LINEAR;  Surgeon: [name], MD;  Location: [facility];  Service: Endoscopy;  Laterality: N/A;  . Fine needle aspiration N/A 01/09/2013    Procedure: FINE NEEDLE ASPIRATION (FNA) LINEAR;  Surgeon: [name], MD;  Location: [facility];  Service: Endoscopy;  Laterality: N/A;  . Eye surgery  2008    cataract-Dr. [name]    Family History  Problem Relation Age of Onset  . Congestive Heart Failure Mother     Social History:  reports that she has never smoked. She  has never used smokeless tobacco. She reports that she does not drink alcohol or use illicit drugs.  Allergies:  Allergies  Allergen Reactions  . Hydrochlorothiazide   . Leflunomide   . Propoxyphene And Methadone   . Remicade [Infliximab]   . Zinc   . Colchicine Anxiety  . Famotidine Anxiety  . Penicillins Anxiety    Medications: I have reviewed the patient's current medications.  Results for orders placed during the hospital encounter of 03/18/13 (from the past 48 hour(s))  TYPE AND SCREEN     Status: None   Collection Time    03/18/13  1:50 AM      Result Value Range   ABO/RH(D) A POS     Antibody Screen NEG     Sample Expiration 03/21/2013    ABO/RH     Status: None   Collection Time    03/18/13  1:50 AM      Result Value Range   ABO/RH(D) A POS    BASIC METABOLIC PANEL     Status: Abnormal   Collection Time    03/18/13  1:55 AM      Result Value Range   Sodium 135  135 - 145 mEq/L   Potassium 3.3 (*) 3.5 - 5.1 mEq/L   Chloride 96  96 - 112 mEq/L   CO2 29  19 - 32 mEq/L  Glucose, Bld 124 (*) 70 - 99 mg/dL   BUN 11  6 - 23 mg/dL   Creatinine, Ser 4.78 (*) 0.50 - 1.10 mg/dL   Calcium 9.2  8.4 - 29.5 mg/dL   GFR calc non Af Amer >90  >90 mL/min   GFR calc Af Amer >90  >90 mL/min   Comment: (NOTE)     The eGFR has been calculated using the CKD EPI equation.     This calculation has not been validated in all clinical situations.     eGFR's persistently <90 mL/min signify possible Chronic Kidney     Disease.  CBC WITH DIFFERENTIAL     Status: Abnormal   Collection Time    03/18/13  1:55 AM      Result Value Range   WBC 6.0  4.0 - 10.5 K/uL   RBC 3.93  3.87 - 5.11 MIL/uL   Hemoglobin 11.4 (*) 12.0 - 15.0 g/dL   HCT 62.1 (*) 30.8 - 65.7 %   MCV 86.0  78.0 - 100.0 fL   MCH 29.0  26.0 - 34.0 pg   MCHC 33.7  30.0 - 36.0 g/dL   RDW 84.6  96.2 - 95.2 %   Platelets 297  150 - 400 K/uL   Neutrophils Relative % 61  43 - 77 %   Neutro Abs 3.6  1.7 - 7.7 K/uL    Lymphocytes Relative 27  12 - 46 %   Lymphs Abs 1.6  0.7 - 4.0 K/uL   Monocytes Relative 9  3 - 12 %   Monocytes Absolute 0.6  0.1 - 1.0 K/uL   Eosinophils Relative 3  0 - 5 %   Eosinophils Absolute 0.2  0.0 - 0.7 K/uL   Basophils Relative 0  0 - 1 %   Basophils Absolute 0.0  0.0 - 0.1 K/uL  PROTIME-INR     Status: None   Collection Time    03/18/13  1:55 AM      Result Value Range   Prothrombin Time 12.5  11.6 - 15.2 seconds   INR 0.95  0.00 - 1.49  MRSA PCR SCREENING     Status: None   Collection Time    03/18/13  5:12 AM      Result Value Range   MRSA by PCR NEGATIVE  NEGATIVE   Comment:            The GeneXpert MRSA Assay (FDA     approved for NASAL specimens     only), is one component of a     comprehensive MRSA colonization     surveillance program. It is not     intended to diagnose MRSA     infection nor to guide or     monitor treatment for     MRSA infections.  CBC     Status: Abnormal   Collection Time    03/18/13  7:40 AM      Result Value Range   WBC 6.1  4.0 - 10.5 K/uL   RBC 3.56 (*) 3.87 - 5.11 MIL/uL   Hemoglobin 10.3 (*) 12.0 - 15.0 g/dL   HCT 84.1 (*) 32.4 - 40.1 %   MCV 86.8  78.0 - 100.0 fL   MCH 28.9  26.0 - 34.0 pg   MCHC 33.3  30.0 - 36.0 g/dL   RDW 02.7  25.3 - 66.4 %   Platelets 280  150 - 400 K/uL  BASIC METABOLIC PANEL     Status:  Abnormal   Collection Time    03/18/13  7:40 AM      Result Value Range   Sodium 136  135 - 145 mEq/L   Potassium 3.8  3.5 - 5.1 mEq/L   Chloride 99  96 - 112 mEq/L   CO2 28  19 - 32 mEq/L   Glucose, Bld 94  70 - 99 mg/dL   BUN 9  6 - 23 mg/dL   Creatinine, Ser 1.61 (*) 0.50 - 1.10 mg/dL   Calcium 9.2  8.4 - 09.6 mg/dL   GFR calc non Af Amer >90  >90 mL/min   GFR calc Af Amer >90  >90 mL/min   Comment: (NOTE)     The eGFR has been calculated using the CKD EPI equation.     This calculation has not been validated in all clinical situations.     eGFR's persistently <90 mL/min signify possible Chronic  Kidney     Disease.  PROTIME-INR     Status: None   Collection Time    03/18/13  7:40 AM      Result Value Range   Prothrombin Time 13.4  11.6 - 15.2 seconds   INR 1.04  0.00 - 1.49    Dg Chest 1 View  03/18/2013   CLINICAL DATA:  Fall with pain  EXAM: CHEST - 1 VIEW  COMPARISON:  01/05/2013  FINDINGS: No cardiomegaly. Unchanged aortic tortuosity. Coronary arterial stenting.  Unchanged elevation of the left diaphragm. No infiltrate, edema, effusion, or pneumothorax. No evident fracture.  IMPRESSION: 1. No evidence of acute cardiopulmonary disease. 2. No evident fracture.   Electronically Signed   By: Tiburcio Pea   On: 03/18/2013 02:49   Dg Thoracic Spine 2 View  03/18/2013   CLINICAL DATA:  Fall with back and leg pain.  EXAM: THORACIC SPINE - 2 VIEW  COMPARISON:  CHEST x-ray 06/14/2011.  FINDINGS: Remote T11 superior endplate compression deformity. No acute fracture identified. No subluxation. Mild dextro curvature of the upper thoracic spine. Osteopenia.  IMPRESSION: 1. No acute fracture detected. 2. Remote T11 compression fracture.   Electronically Signed   By: Tiburcio Pea   On: 03/18/2013 02:51   Dg Hip Complete Right  03/18/2013   CLINICAL DATA:  Fall with right leg pain  EXAM: RIGHT HIP - COMPLETE 2+ VIEW  COMPARISON:  None.  FINDINGS: There is a lucency through the proximal right femur, involving the greater trochanter at least, extending distally towards the shaft and lesser trochanter. No displacement is evident. The femoral head is located. No evidence of acute pelvic ring fracture, although there is distortion by rightward rotation.  IMPRESSION: Proximal right femur fracture involving the greater trochanter. The fracture is likely intertrochanteric, but the exact extent is uncertain, suggest MRI or CT.   Electronically Signed   By: Tiburcio Pea   On: 03/18/2013 02:47   Ct Head Wo Contrast  03/18/2013   CLINICAL DATA:  Status post fall.  EXAM: CT HEAD WITHOUT CONTRAST  CT  CERVICAL SPINE WITHOUT CONTRAST  TECHNIQUE: Multidetector CT imaging of the head and cervical spine was performed following the standard protocol without intravenous contrast. Multiplanar CT image reconstructions of the cervical spine were also generated.  COMPARISON:  Head and cervical spine CT scan 01/04/2013 and 10/24/2011.  FINDINGS: CT HEAD FINDINGS  Again seen are cortical atrophy and chronic microvascular ischemic change. No evidence of acute intracranial abnormality including infarction, hemorrhage, mass lesion, mass effect, midline shift or abnormal extra-axial fluid collection  is identified. No pneumocephalus or hydrocephalus. Calvarium intact.  CT CERVICAL SPINE FINDINGS  No fracture subluxation of the cervical spine is identified. Marked widening of the atlantodental interval is unchanged. Paraspinous soft tissue structures are unremarkable. The lung apices are clear  IMPRESSION: CT HEAD IMPRESSION  No acute intracranial abnormality. Stable compared to prior exam.  CT CERVICAL SPINE IMPRESSION  Negative for fracture or subluxation. Chronic widening of the atlantodental interval is unchanged.   Electronically Signed   By: Drusilla Kanner M.D.   On: 03/18/2013 02:53   Ct Cervical Spine Wo Contrast  03/18/2013   CLINICAL DATA:  Status post fall.  EXAM: CT HEAD WITHOUT CONTRAST  CT CERVICAL SPINE WITHOUT CONTRAST  TECHNIQUE: Multidetector CT imaging of the head and cervical spine was performed following the standard protocol without intravenous contrast. Multiplanar CT image reconstructions of the cervical spine were also generated.  COMPARISON:  Head and cervical spine CT scan 01/04/2013 and 10/24/2011.  FINDINGS: CT HEAD FINDINGS  Again seen are cortical atrophy and chronic microvascular ischemic change. No evidence of acute intracranial abnormality including infarction, hemorrhage, mass lesion, mass effect, midline shift or abnormal extra-axial fluid collection is identified. No pneumocephalus or  hydrocephalus. Calvarium intact.  CT CERVICAL SPINE FINDINGS  No fracture subluxation of the cervical spine is identified. Marked widening of the atlantodental interval is unchanged. Paraspinous soft tissue structures are unremarkable. The lung apices are clear  IMPRESSION: CT HEAD IMPRESSION  No acute intracranial abnormality. Stable compared to prior exam.  CT CERVICAL SPINE IMPRESSION  Negative for fracture or subluxation. Chronic widening of the atlantodental interval is unchanged.   Electronically Signed   By: Drusilla Kanner M.D.   On: 03/18/2013 02:53    ROS: per H&P  Physical Exam: Bilateral lower extremities examined. she is nontender to palpation in left lower extrimity and tolerates hip flexion and IR/ER without difficulty. She has significant pain however with any attmpts at Right hip ROM. She is tender to palpation about the right hip girdle. NVI to bilateral lower extremities. Vitals Temp:  [97.9 F (36.6 C)-98.4 F (36.9 C)] 97.9 F (36.6 C) (09/22 0426) Pulse Rate:  [68-107] 94 (09/22 0501) Resp:  [14-16] 14 (09/22 0426) BP: (120-160)/(57-80) 135/68 mmHg (09/22 0501) SpO2:  [95 %-100 %] 95 % (09/22 0501) Weight:  [60.8 kg (134 lb 0.6 oz)] 60.8 kg (134 lb 0.6 oz) (09/22 0501) Body mass index is 23.75 kg/(m^2).  Assessment/Plan: Impression: Right hip fracture -nondisplaced greater troch with apparent extension down into intertroch region Treatment: Given the fracture pattern this would best be served with surgical stabilization. Will plan ORIF at noon today and continue NPO. Will follow up on left hip xrays however admission pelvis xray did not show any obvious fractures.  [facility] for Dr. Francena Hanly 03/18/2013, 9:13 AM  Agree with above. risks and benefits reviewed. Patient understands and accepts and agrees with plan

## 2013-03-18 NOTE — Care Management Note (Signed)
    Page 1 of 1   03/22/2013     3:07:27 PM   CARE MANAGEMENT NOTE 03/22/2013  Patient:  [name], [name]   Account Number:  [id number]  Date Initiated:  03/18/2013  Documentation initiated by:  [facility]  Subjective/Objective Assessment:   mechanical fall, right hip fracture, ORIF     Action/Plan:   lives at ALF   Anticipated DC Date:  03/21/2013   Anticipated DC Plan:  SKILLED NURSING FACILITY  In-house referral  Clinical Social Worker      DC Planning Services  CM consult      Choice offered to / List presented to:             Status of service:  Completed, signed off Medicare Important Message given?   (If response is "NO", the following Medicare IM given date fields will be blank) Date Medicare IM given:   Date Additional Medicare IM given:    Discharge Disposition:  SKILLED NURSING FACILITY  Per UR Regulation:  Reviewed for med. necessity/level of care/duration of stay  If discussed at Long Length of Stay Meetings, dates discussed:    Comments:  03/22/13 Miguel Medal,RN,BSN [phone] PT FOR DC TO SNF TODAY, PER CSW ARRANGEMENTS.  03/21/13 Adryanna Friedt,RN,BSN [phone] PT WITH FALL THIS AM; WILL PLAN FOR DC TO SNF TOMORROW, IF STABLE.  03/18/13 Nikodem Leadbetter,RN,BSN [phone] CSW CONSULTED TO FACILITATE DC TO SNF WHEN MEDICALLY STABLE FOR DC.  WILL FOLLOW PROGRESS.

## 2013-03-19 ENCOUNTER — Encounter (HOSPITAL_COMMUNITY): Payer: Self-pay | Admitting: Orthopedic Surgery

## 2013-03-19 DIAGNOSIS — D649 Anemia, unspecified: Secondary | ICD-10-CM

## 2013-03-19 LAB — URINE MICROSCOPIC-ADD ON

## 2013-03-19 LAB — URINALYSIS, ROUTINE W REFLEX MICROSCOPIC
Glucose, UA: NEGATIVE mg/dL
Ketones, ur: NEGATIVE mg/dL
Nitrite: NEGATIVE
Protein, ur: NEGATIVE mg/dL
Urobilinogen, UA: 0.2 mg/dL (ref 0.0–1.0)
pH: 5.5 (ref 5.0–8.0)

## 2013-03-19 LAB — BASIC METABOLIC PANEL
Calcium: 8.6 mg/dL (ref 8.4–10.5)
Creatinine, Ser: 0.57 mg/dL (ref 0.50–1.10)
GFR calc Af Amer: >90 mL/min (ref >90)
GFR calc non Af Amer: 85 mL/min — ABNORMAL LOW (ref >90)

## 2013-03-19 LAB — PREPARE RBC (CROSSMATCH)

## 2013-03-19 LAB — CBC
HCT: 23.2 % — ABNORMAL LOW (ref 36.0–46.0)
MCH: 29.3 pg (ref 26.0–34.0)
MCV: 87.2 fL (ref 78.0–100.0)
Platelets: 220 10*3/uL (ref 150–400)
RDW: 14.4 % (ref 11.5–15.5)

## 2013-03-19 LAB — HEMOGLOBIN AND HEMATOCRIT, BLOOD
HCT: 25.8 % — ABNORMAL LOW (ref 36.0–46.0)
Hemoglobin: 8.8 g/dL — ABNORMAL LOW (ref 12.0–15.0)

## 2013-03-19 MED ORDER — MORPHINE SULFATE 2 MG/ML IJ SOLN: 0.5000 mg | INTRAMUSCULAR | Status: DC | PRN

## 2013-03-19 MED ORDER — LEVOTHYROXINE SODIUM 25 MCG PO TABS
25.0000 ug | ORAL_TABLET | Freq: Every day | ORAL | Status: DC
  Administered 2013-03-20 – 2013-03-22 (×3): 25 ug via ORAL
  Filled 2013-03-19 (×4): qty 1

## 2013-03-19 MED ORDER — DEXTROSE 5 % IV SOLN
1.0000 g | INTRAVENOUS | Status: DC
  Administered 2013-03-19 – 2013-03-20 (×2): 1 g via INTRAVENOUS
  Filled 2013-03-19 (×2): qty 10

## 2013-03-19 MED ORDER — POLYETHYLENE GLYCOL 3350 17 G PO PACK
17.0000 g | PACK | Freq: Every day | ORAL | Status: DC
  Administered 2013-03-19 – 2013-03-22 (×4): 17 g via ORAL
  Filled 2013-03-19 (×4): qty 1

## 2013-03-19 NOTE — Progress Notes (Signed)
Clinical Social Work Department CLINICAL SOCIAL WORK PLACEMENT NOTE 03/19/2013  Patient:  [name], [name]  Account Number:  [id number] Admit date:  03/18/2013  Clinical Social Worker:  Carren Rang  Date/time:  03/19/2013 01:26 PM  Clinical Social Work is seeking post-discharge placement for this patient at the following level of care:   SKILLED NURSING   (*CSW will update this form in Epic as items are completed)   03/19/2013  Patient/family provided with [facility] Department of Clinical Social Work's list of facilities offering this level of care within the geographic area requested by the patient (or if unable, by the patient's family).  03/19/2013  Patient/family informed of their freedom to choose among providers that offer the needed level of care, that participate in Medicare, Medicaid or managed care program needed by the patient, have an available bed and are willing to accept the patient.  03/19/2013  Patient/family informed of [facility]' ownership interest in [facility], as well as of the fact that they are under no obligation to receive care at this facility.  PASARR submitted to EDS on  PASARR number received from EDS on   FL2 transmitted to all facilities in geographic area requested by pt/family on  03/19/2013 FL2 transmitted to all facilities within larger geographic area on   Patient informed that his/her managed care company has contracts with or will negotiate with  certain facilities, including the following:     Patient/family informed of bed offers received:   Patient chooses bed at  Physician recommends and patient chooses bed at    Patient to be transferred to  on   Patient to be transferred to facility by   The following physician request were entered in Epic:   Additional Comments:  Maree Krabbe, MSW, [employer] [phone]

## 2013-03-19 NOTE — Progress Notes (Signed)
Clinical Social Work Department BRIEF PSYCHOSOCIAL ASSESSMENT 03/19/2013  Patient:  [name], [name]     Account Number:  [id number]     Admit date:  03/18/2013  Clinical Social Worker:  Carren Rang  Date/Time:  03/19/2013 01:19 PM  Referred by:  Care Management  Date Referred:  03/19/2013 Referred for  SNF Placement   Other Referral:   Interview type:  Other - See comment Other interview type:   CSW spoke to patient and son [name] at bedside.    PSYCHOSOCIAL DATA Living Status:  FACILITY Admitted from facility:  OTHER Level of care:  Assisted Living Primary support name:  [name] [phone] Primary support relationship to patient:  CHILD, ADULT Degree of support available:   Okay    CURRENT CONCERNS Current Concerns  Post-Acute Placement   Other Concerns:    SOCIAL WORK ASSESSMENT / PLAN CSW received referral that patient was from [facility]F but is possibly needing SNF at dc for short term rehab. CSW went into room and introduced self to patient and son at bedside. Patient appeared to be agitated and confused. CSW explained the SNF process to patient and son. Patient appeared to be open to SNF, but also stated she "wants to go home now." Son asked CSW to fax out patient to [facility] and son stated, "he is going out of town and to have CSW give bed offers to patient." CSW will complete FL2 for MD signature and update patient when bed offers are received.   Assessment/plan status:  Psychosocial Support/Ongoing Assessment of Needs Other assessment/ plan:   Information/referral to community resources:   SNF information    PATIENT'S/FAMILY'S RESPONSE TO PLAN OF CARE: Patient appears open to SNF option, but also appears to be confused. Son at bedside states he wants patient to have short term rehab.       Maree Krabbe, MSW, [facility]resia Majors [phone]

## 2013-03-19 NOTE — Evaluation (Signed)
Physical Therapy Evaluation Patient Details Name: [name] MRN: [id number] DOB: 1930/03/17 Today's Date: 03/19/2013 Time: 0811-0824 PT Time Calculation (min): 13 min  PT Assessment / Plan / Recommendation History of Present Illness  [name] w/ pmhx significant for HTN, urinary incontinence, RA, hypothyroidism, osteoporosis, hx psychoses, presents due to fall. The patient is a poor historian, but states she just "slipped" from her bed. She denies any LOC, CP, SOB.  Per report, she comes from [facility] after having this fall around midnight. Pt with right femur fx s/p ORIF  Clinical Impression  Pt with limited movement today and [facility] pt can normally get herself sitting up in the bed but no longer able to transfer without 1-2 people. Will follow acutely to return pt to PLOF maximizing mobility to decrease burden of care. Pt will need 24hr total assist at discharge which staff states they were providing PTA. If [facility] able to continue this level of care pt could return there but if not will need SNF.      PT Assessment  Patient needs continued PT services    Follow Up Recommendations  SNF;Supervision/Assistance - 24 hour    Does the patient have the potential to tolerate intense rehabilitation      Barriers to Discharge        Equipment Recommendations       Recommendations for Other Services     Frequency Min 2X/week    Precautions / Restrictions Precautions Precautions: Fall Restrictions Weight Bearing Restrictions: Yes RLE Weight Bearing: Weight bearing as tolerated   Pertinent Vitals/Pain Pt reports pain RLE with movement but unable to rate      Mobility  Bed Mobility Bed Mobility: Rolling Right;Supine to Sit;Sit to Supine;Scooting to [facility];Sitting - Scoot to [place] of Bed Rolling Right: 2: Max assist Supine to Sit: 1: +2 Total assist;HOB elevated Supine to Sit: Patient Percentage: 20% Sitting - Scoot to Edge of Bed: 1: +1  Total assist Sit to Supine: 1: +2 Total assist;HOB flat Sit to Supine: Patient Percentage: 0% Scooting to HOB: 1: +2 Total assist Scooting to [facility]: Patient Percentage: 0% Details for Bed Mobility Assistance: assist with pad and rail for pt to pivot to EOB pt initiating movement of legs to edge of surface with assist to move RLE fully Transfers Transfers: Sit to Stand;Stand to Sit Sit to Stand: 1: +2 Total assist;From elevated surface;From bed Sit to Stand: Patient Percentage: 10% Stand to Sit: 1: +2 Total assist;To bed Stand to Sit: Patient Percentage: 10% Details for Transfer Assistance: Attempted to stand from elevated bed with bil knees blocked and use of pad and belt to elevate sacrum, pt unable to clear buttocks or accept weight on feet Ambulation/Gait Ambulation/Gait Assistance: Not tested (comment)    Exercises     PT Diagnosis: Altered mental status;Acute pain;Generalized weakness  PT Problem List: Decreased strength;Decreased activity tolerance;Decreased mobility;Pain PT Treatment Interventions: Functional mobility training;Patient/family education;Therapeutic activities;Therapeutic exercise     PT Goals(Current goals can be found in the care plan section) Acute Rehab PT Goals Patient Stated Goal: pt unable to state Time For Goal Achievement: 04/02/13 Potential to Achieve Goals: Fair  Visit Information  Last PT Received On: 03/19/13 Assistance Needed: +2 History of Present Illness: [name] w/ pmhx significant for HTN, urinary incontinence, RA, hypothyroidism, osteoporosis, hx psychoses, presents due to fall. The patient is a poor historian, but states she just "slipped" from her bed. She denies any  LOC, CP, SOB.  Per report, she comes from [facility] after having this fall around midnight. Pt with right femur fx s/p ORIF       Prior Functioning  Home Living Family/patient expects to be discharged to:: Assisted living Home Equipment: Walker - 2  wheels;Wheelchair - manual Prior Function Level of Independence: Needs assistance ADL's / Homemaking Assistance Needed: Staff at [facility] report pt is total assist for all ADLs and transfers. She was walking with assist months ago but since that time has not ambulated and is transferred to a WC Communication Communication: No difficulties    Cognition  Cognition Arousal/Alertness: Awake/alert Behavior During Therapy: Flat affect Overall Cognitive Status: Impaired/Different from baseline Area of Impairment: Orientation;Safety/judgement;Memory;Following commands Orientation Level: Time;Situation;Place Memory: Decreased short-term memory Following Commands: Follows one step commands inconsistently;Follows one step commands with increased time Safety/Judgement: Decreased awareness of safety;Decreased awareness of deficits    Extremity/Trunk Assessment Upper Extremity Assessment Upper Extremity Assessment: Generalized weakness (RA deformities of both hands) Lower Extremity Assessment Lower Extremity Assessment: Generalized weakness (pt with maintained bil knee flexion) Cervical / Trunk Assessment Cervical / Trunk Assessment: Kyphotic   Balance Balance Balance Assessed: Yes Static Sitting Balance Static Sitting - Balance Support: Feet supported Static Sitting - Level of Assistance: 4: Min assist Static Sitting - Comment/# of Minutes: 3  End of Session PT - End of Session Activity Tolerance: Patient limited by pain Patient left: in bed;with call bell/phone within reach Nurse Communication: Mobility status  GP     Delorse Lek 03/19/2013, 12:14 PM Delaney Meigs, PT [phone]

## 2013-03-19 NOTE — Progress Notes (Signed)
Subjective: 1 Day Post-Op Procedure(s) (LRB): INTRAMEDULLARY (IM) NAIL FEMORAL (Right) Patient reports pain as 5 on 0-10 scale.  Patient reports the entire right leg is sore. She also complains of poor appetite.  Objective: Vital signs in last 24 hours: Temp:  [98.1 F (36.7 C)-99.8 F (37.7 C)] 98.9 F (37.2 C) (09/23 1500) Pulse Rate:  [69-104] 73 (09/23 1500) Resp:  [13-18] 16 (09/23 1500) BP: (104-141)/(48-70) 124/62 mmHg (09/23 1500) SpO2:  [95 %-100 %] 97 % (09/23 1500)  Intake/Output from previous day: 09/22 0701 - 09/23 0700 In: 2798.8 [P.O.:480; I.V.:2268.8; IV Piggyback:50] Out: 750 [Urine:700; Blood:50] Intake/Output this shift: Total I/O In: 637.5 [I.V.:250; Blood:387.5] Out: 150 [Urine:150]   Recent Labs  03/18/13 0155 03/18/13 0740 03/19/13 0550  HGB 11.4* 10.3* 7.8*    Recent Labs  03/18/13 0740 03/19/13 0550  WBC 6.1 6.5  RBC 3.56* 2.66*  HCT 30.9* 23.2*  PLT 280 220    Recent Labs  03/18/13 0740 03/19/13 0550  NA 136 137  K 3.8 3.9  CL 99 103  CO2 28 27  BUN 9 9  CREATININE 0.45* 0.57  GLUCOSE 94 90  CALCIUM 9.2 8.6    Recent Labs  03/18/13 0155 03/18/13 0740  INR 0.95 1.04    ABD soft Sensation intact distally Dorsiflexion/Plantar flexion intact Compartment soft Dressings in place; no active bleeding  Assessment/Plan: 1 Day Post-Op Procedure(s) (LRB): INTRAMEDULLARY (IM) NAIL FEMORAL (Right) Advance diet Up with therapy Plans continue for placement at SNF.  Akito Boomhower B 03/19/2013, 3:47 PM

## 2013-03-19 NOTE — Clinical Documentation Improvement (Signed)
THIS DOCUMENT IS NOT A PERMANENT PART OF THE MEDICAL RECORD  Please update your documentation with the medical record to reflect your response to this query. If you need help knowing how to do this please call [phone].  03/19/13  Dear Dr. Sunnie Nielsen Marton Redwood  In an effort to better capture your patient's severity of illness, reflect appropriate length of stay and utilization of resources, a review of the patient medical record has revealed the following indicators.    Based on your clinical judgment, please clarify and document in a progress note and/or discharge summary the clinical condition associated with the following supporting information:   Possible Clinical Conditions?   " Expected Acute Blood Loss Anemia  " Acute Blood Loss Anemia  " Acute on chronic blood loss anemia  " Precipitous drop in Hematocrit  " Other Condition  " Cannot Clinically Determine    Risk Factors:  Anemia, monitor noted per 9/22 progress  notes.   Diagnostics: H&H on 9/23:    7.8/23.2 H&H on 9/22:  11.4/33.8  Treatments: Transfusion: IV fluids / plasma expanders: Serial H&H monitoring Medications (Fe, Procrit)  Reviewed:    Additional documentation provided per 9/23 progress notes.                             Thank You,  Marciano Sequin,  Clinical Documentation Specialist: [phone] Health Information Management 

## 2013-03-19 NOTE — Progress Notes (Signed)
[facility] PROGRESS NOTE  [name] ZOX:[id number] DOB: May 05, 1930 DOA: 03/18/2013 PCP: No primary provider on file.  Assessment/Plan: 1-Right hip Fracture: Patient at moderate risk for surgery. She denies chest pain, dyspnea at rest or on exertion. -Patient S/P Open reduction and internal fixation of right intertrochanteric hip fracture 9-23. -Lovenox for DVT prophylaxis.  -Miralax to prevent constipation.  -PT per ortho.   2-History of dementia and behavioral disturbance: Patient at baseline. \  3-Hypokalemia: Resolved.   4-Hypothyroidism: continue with synthroid.   5-Left Hip Pain: Complete left hip x ray negative.   6-Acute blood loss anemia: expected post surgery. Hb decrease from 11 to 7.8. Will transfuse 1 unit of PRBC.   7-UTI: UA with too numerous to count WBC. I will start Ceftriaxone IV. Follow up urine culture results.   Code Status: Full Code.  Family Communication: Care discussed with patient.  Disposition Plan: SNF at discharge.    Consultants:  Dr Rennis Chris, ortho  Procedures:  none  Antibiotics:  none  HPI/Subjective: Patient sleepy but wake up to answer question. Does not want to eat breakfast. No chest pain or dyspnea.   Objective: Filed Vitals:   03/19/13 0530  BP: 104/49  Pulse: 74  Temp: 98.8 F (37.1 C)  Resp: 16    Intake/Output Summary (Last 24 hours) at 03/19/13 0907 Last data filed at 03/19/13 0500  Gross per 24 hour  Intake 2798.75 ml  Output    750 ml  Net 2048.75 ml   Filed Weights   03/18/13 0501  Weight: 60.8 kg (134 lb 0.6 oz)    Exam:   General:  No acute distress.   Cardiovascular: S 1, S 2 RRR  Respiratory: CTA  Abdomen: BS present, soft, NT  Musculoskeletal: no edema  Data Reviewed: Basic Metabolic Panel:  Recent Labs Lab 03/18/13 0155 03/18/13 0740 03/19/13 0550  NA 135 136 137  K 3.3* 3.8 3.9  CL 96 99 103  CO2 29 28 27   GLUCOSE 124* 94 90  BUN 11 9 9   CREATININE 0.47* 0.45*  0.57  CALCIUM 9.2 9.2 8.6   Liver Function Tests: No results found for this basename: AST, ALT, ALKPHOS, BILITOT, PROT, ALBUMIN,  in the last 168 hours No results found for this basename: LIPASE, AMYLASE,  in the last 168 hours No results found for this basename: AMMONIA,  in the last 168 hours CBC:  Recent Labs Lab 03/18/13 0155 03/18/13 0740 03/19/13 0550  WBC 6.0 6.1 6.5  NEUTROABS 3.6  --   --   HGB 11.4* 10.3* 7.8*  HCT 33.8* 30.9* 23.2*  MCV 86.0 86.8 87.2  PLT 297 280 220   Cardiac Enzymes: No results found for this basename: CKTOTAL, CKMB, CKMBINDEX, TROPONINI,  in the last 168 hours BNP (last 3 results) No results found for this basename: PROBNP,  in the last 8760 hours CBG: No results found for this basename: GLUCAP,  in the last 168 hours  Recent Results (from the past 240 hour(s))  MRSA PCR SCREENING     Status: None   Collection Time    03/18/13  5:12 AM      Result Value Range Status   MRSA by PCR NEGATIVE  NEGATIVE Final   Comment:            The GeneXpert MRSA Assay (FDA     approved for NASAL specimens     only), is one component of a     comprehensive MRSA colonization  surveillance program. It is not     intended to diagnose MRSA     infection nor to guide or     monitor treatment for     MRSA infections.     Studies: Dg Chest 1 View  03/18/2013   CLINICAL DATA:  Fall with pain  EXAM: CHEST - 1 VIEW  COMPARISON:  01/05/2013  FINDINGS: No cardiomegaly. Unchanged aortic tortuosity. Coronary arterial stenting.  Unchanged elevation of the left diaphragm. No infiltrate, edema, effusion, or pneumothorax. No evident fracture.  IMPRESSION: 1. No evidence of acute cardiopulmonary disease. 2. No evident fracture.   Electronically Signed   By: Tiburcio Pea   On: 03/18/2013 02:49   Dg Thoracic Spine 2 View  03/18/2013   CLINICAL DATA:  Fall with back and leg pain.  EXAM: THORACIC SPINE - 2 VIEW  COMPARISON:  CHEST x-ray 06/14/2011.  FINDINGS: Remote T11  superior endplate compression deformity. No acute fracture identified. No subluxation. Mild dextro curvature of the upper thoracic spine. Osteopenia.  IMPRESSION: 1. No acute fracture detected. 2. Remote T11 compression fracture.   Electronically Signed   By: Tiburcio Pea   On: 03/18/2013 02:51   Dg Hip Complete Left  03/18/2013   CLINICAL DATA:  79 year old female status post fall with pain.  EXAM: LEFT HIP - COMPLETE 2+ VIEW  COMPARISON:  Pelvis and right hip series 0 218 hr the same day and earlier.  FINDINGS: Th left femoral head is normally located. Bone mineralization is within normal limits for age. Visible pelvis appears intact. Proximal left femur intact.  IMPRESSION: No acute fracture or dislocation identified about the left hip. See right hip series from the same day reported separately.   Electronically Signed   By: Augusto Gamble M.D.   On: 03/18/2013 09:46   Dg Hip Complete Right  03/18/2013   CLINICAL DATA:  Fall with right leg pain  EXAM: RIGHT HIP - COMPLETE 2+ VIEW  COMPARISON:  None.  FINDINGS: There is a lucency through the proximal right femur, involving the greater trochanter at least, extending distally towards the shaft and lesser trochanter. No displacement is evident. The femoral head is located. No evidence of acute pelvic ring fracture, although there is distortion by rightward rotation.  IMPRESSION: Proximal right femur fracture involving the greater trochanter. The fracture is likely intertrochanteric, but the exact extent is uncertain, suggest MRI or CT.   Electronically Signed   By: Tiburcio Pea   On: 03/18/2013 02:47   Dg Hip Operative Right  03/18/2013   CLINICAL DATA:  Right hip fracture.  EXAM: DG OPERATIVE RIGHT HIP  TECHNIQUE: A single spot fluoroscopic AP image of the right hip is submitted.  COMPARISON:  Preoperative study of earlier today at 218 hr.  FINDINGS: A total of 4 intraoperative views. These demonstrate placement of a rod and screw fixation device within  the proximal right femur. No acute hardware complication.  IMPRESSION: Intraoperative imaging of proximal right femoral fixation.   Electronically Signed   By: Jeronimo Greaves   On: 03/18/2013 15:00   Ct Head Wo Contrast  03/18/2013   CLINICAL DATA:  Status post fall.  EXAM: CT HEAD WITHOUT CONTRAST  CT CERVICAL SPINE WITHOUT CONTRAST  TECHNIQUE: Multidetector CT imaging of the head and cervical spine was performed following the standard protocol without intravenous contrast. Multiplanar CT image reconstructions of the cervical spine were also generated.  COMPARISON:  Head and cervical spine CT scan 01/04/2013 and 10/24/2011.  FINDINGS: CT  HEAD FINDINGS  Again seen are cortical atrophy and chronic microvascular ischemic change. No evidence of acute intracranial abnormality including infarction, hemorrhage, mass lesion, mass effect, midline shift or abnormal extra-axial fluid collection is identified. No pneumocephalus or hydrocephalus. Calvarium intact.  CT CERVICAL SPINE FINDINGS  No fracture subluxation of the cervical spine is identified. Marked widening of the atlantodental interval is unchanged. Paraspinous soft tissue structures are unremarkable. The lung apices are clear  IMPRESSION: CT HEAD IMPRESSION  No acute intracranial abnormality. Stable compared to prior exam.  CT CERVICAL SPINE IMPRESSION  Negative for fracture or subluxation. Chronic widening of the atlantodental interval is unchanged.   Electronically Signed   By: Drusilla Kanner M.D.   On: 03/18/2013 02:53   Ct Cervical Spine Wo Contrast  03/18/2013   CLINICAL DATA:  Status post fall.  EXAM: CT HEAD WITHOUT CONTRAST  CT CERVICAL SPINE WITHOUT CONTRAST  TECHNIQUE: Multidetector CT imaging of the head and cervical spine was performed following the standard protocol without intravenous contrast. Multiplanar CT image reconstructions of the cervical spine were also generated.  COMPARISON:  Head and cervical spine CT scan 01/04/2013 and 10/24/2011.   FINDINGS: CT HEAD FINDINGS  Again seen are cortical atrophy and chronic microvascular ischemic change. No evidence of acute intracranial abnormality including infarction, hemorrhage, mass lesion, mass effect, midline shift or abnormal extra-axial fluid collection is identified. No pneumocephalus or hydrocephalus. Calvarium intact.  CT CERVICAL SPINE FINDINGS  No fracture subluxation of the cervical spine is identified. Marked widening of the atlantodental interval is unchanged. Paraspinous soft tissue structures are unremarkable. The lung apices are clear  IMPRESSION: CT HEAD IMPRESSION  No acute intracranial abnormality. Stable compared to prior exam.  CT CERVICAL SPINE IMPRESSION  Negative for fracture or subluxation. Chronic widening of the atlantodental interval is unchanged.   Electronically Signed   By: Drusilla Kanner M.D.   On: 03/18/2013 02:53   Dg Pelvis Portable  03/18/2013   CLINICAL DATA:  Postop  EXAM: PORTABLE PELVIS  COMPARISON:  CT abdomen pelvis 01/05/2013  FINDINGS: There is no evidence of pelvic fracture or diastasis. No other pelvic bone lesions are seen. There are postoperative changes of right dynamic hip screw and intramedullary rod in the proximal right femur. No complicating features identified. Expected locules of subcutaneous gas are noted. The bones are diffusely osteopenic.  IMPRESSION: ORIF of the proximal right femur.  No acute abnormality identified.   Electronically Signed   By: Britta Mccreedy   On: 03/18/2013 15:11    Scheduled Meds: . cefTRIAXone (ROCEPHIN)  IV  1 g Intravenous Q24H  . docusate sodium  100 mg Oral BID  . enoxaparin (LOVENOX) injection  40 mg Subcutaneous Q24H  . [START ON 03/20/2013] levothyroxine  25 mcg Oral QAC breakfast  . sodium chloride  3 mL Intravenous Q12H   Continuous Infusions:    Principal Problem:   Hip fracture, right Active Problems:   HYPERTENSION   Hypothyroidism    Time spent: 25 minutes.     REGALADO,BELKYS  [facility] Pager [phone]. If 7PM-7AM, please contact night-coverage at [email or web address], password [facility] 03/19/2013, 9:07 AM  LOS: 1 day

## 2013-03-19 NOTE — Op Note (Signed)
NAMETERRILL, [name] NO.:  [id number]  MEDICAL RECORD NO.:  [id number]  LOCATION:  2W08C                        FACILITY:  [facility]  PHYSICIAN:  Vania Rea. Al Gagen, M.D.  DATE OF BIRTH:  06/04/1930  DATE OF PROCEDURE:  03/18/2013 DATE OF DISCHARGE:                              OPERATIVE REPORT   PREOPERATIVE DIAGNOSIS:  Right intertrochanteric hip fracture.  POSTOPERATIVE DIAGNOSIS:  Right intertrochanteric hip fracture.  PROCEDURE:  Open reduction and internal fixation of right intertrochanteric hip fracture.  SURGEON:  Vania Rea. Elowen Debruyn, M.D.  Threasa HeadsFrench Ana A. Shuford, P.A.-C.  ANESTHESIA:  General endotracheal.  ESTIMATED BLOOD LOSS:  200 mL.  DRAINS:  None.  HISTORY:  Ms. Boesen is an 79 year old female with a number of chronic underlying medical problems, who fell late last night with immediate complaints of right hip pain and inability to bear weight.  She was brought to the emergency room where x-rays showed evidence for right intertrochanteric hip fracture.  She had been admitted to the Medicine service and is brought to the operating room at this time for planned right hip open reduction and internal fixation with intramedullary hip screw fixation.  Preoperatively, I counseled Ms. Delone on treatment options, as well as risks versus benefits thereof.  Possible surgical complications were reviewed including the potential for bleeding, infection, neurovascular injury, malunion, nonunion, loss of fixation, anesthetic complication, and possible need for additional surgery.  She understands and accepts and agrees with our planned procedure.  PROCEDURE IN DETAIL:  After undergoing routine preop evaluation, the patient received prophylactic antibiotics, and brought to the operative room on the hospital bed underwent smooth induction of a general endotracheal anesthesia.  Transferred to the fracture table.  We did place a Foley catheter and her urine  appeared quite cloudy, so this was sent for routine culture and sensitivity.  She was then placed into proper position on the fracture table.  The left leg placed in well leg holder.  The right leg placed in gentle longitudinal traction. Fluoroscopic images were then obtained, which confirmed good alignment of the fracture site.  At this point, the right hip girdle was sterilely prepped and draped in standard fashion.  Time-out was called.  A 3-cm longitudinal incision was then made just proximal to the tip of the greater trochanter.  Dissection then carried down through skin and subcutaneous deep fascia.  A starting awl was then directed to the tip of the greater trochanter and proper positioning was then confirmed on both AP and lateral fluoroscopic images.  The starting awl was introduced into the proximal femur.  A ball-tip guidewire was directed up the femoral canal.  The starting reamer was then introduced to the appropriate depth.  Over the guidewire, we passed our 9-mm short intramedullary hip nail.  Using the outrigger guide, we then placed our cannula for the femoral neck lag screw.  This was the guide pin that was in place at the femoral neck and head with proper positioning, confirmed fluoroscopically.  This was then drilled and a 105-mm lag screw was placed over the guidewire with good fit and fixation.  We placed this in the dynamic locking mode.  We then placed  a single distal static locking screw using the outrigger guide and a 32-mm screw with good fixation. The final fluoroscopic images showed good alignment at the fracture site and good position of the hardware.  Wounds were irrigated.  Closed with 2-0 Vicryl for the subcutaneous layer and intracuticular 3-0 Monocryl for the skin followed by Steri-Strips.  Dry dressing was placed over the right hip.  The patient was then removed from traction, placed in supine, awakened, extubated, and taken recovery room in  stable condition.     Vania Rea. Shahil Speegle, M.D.     KMS/MEDQ  D:  03/18/2013  T:  03/19/2013  Job:  [id number]

## 2013-03-20 DIAGNOSIS — K5900 Constipation, unspecified: Secondary | ICD-10-CM

## 2013-03-20 DIAGNOSIS — N390 Urinary tract infection, site not specified: Secondary | ICD-10-CM

## 2013-03-20 LAB — BASIC METABOLIC PANEL
CO2: 18 mEq/L — ABNORMAL LOW (ref 19–32)
Calcium: 8.6 mg/dL (ref 8.4–10.5)
Chloride: 101 mEq/L (ref 96–112)
GFR calc Af Amer: >90 mL/min (ref >90)
Glucose, Bld: 74 mg/dL (ref 70–99)
Potassium: 4.7 mEq/L (ref 3.5–5.1)

## 2013-03-20 LAB — TYPE AND SCREEN
ABO/RH(D): A POS
Antibody Screen: NEGATIVE
Unit division: 0

## 2013-03-20 LAB — URINE CULTURE: Culture: NO GROWTH

## 2013-03-20 LAB — CBC
Hemoglobin: 9.9 g/dL — ABNORMAL LOW (ref 12.0–15.0)
Platelets: 230 10*3/uL (ref 150–400)
RBC: 3.36 MIL/uL — ABNORMAL LOW (ref 3.87–5.11)
WBC: 6.9 10*3/uL (ref 4.0–10.5)

## 2013-03-20 MED ORDER — ACETAMINOPHEN 500 MG PO TABS: 500.0000 mg | ORAL_TABLET | Freq: Four times a day (QID) | ORAL | Status: DC | PRN

## 2013-03-20 MED ORDER — ASPIRIN EC 325 MG PO TBEC: 325.0000 mg | DELAYED_RELEASE_TABLET | Freq: Every day | ORAL | Status: DC

## 2013-03-20 MED ORDER — CEFUROXIME AXETIL 250 MG PO TABS
250.0000 mg | ORAL_TABLET | Freq: Two times a day (BID) | ORAL | Status: DC
  Administered 2013-03-20 – 2013-03-22 (×4): 250 mg via ORAL
  Filled 2013-03-20 (×6): qty 1

## 2013-03-20 MED ORDER — LACTULOSE 10 GM/15ML PO SOLN
20.0000 g | Freq: Two times a day (BID) | ORAL | Status: DC | PRN
  Administered 2013-03-20 (×2): 20 g via ORAL
  Filled 2013-03-20 (×3): qty 30

## 2013-03-20 NOTE — Progress Notes (Signed)
[facility] PROGRESS NOTE  [name] WJX:[id number] DOB: 1929-12-04 DOA: 03/18/2013 PCP: No primary provider on file.  Assessment/Plan: 1. Right hip fracture. Status post open reduction internal fixation, continue DVT prophylaxis with Lovenox. Physical therapy and social work consulted. Plan for discharge to skilled nursing facility for rehabilitation. 2. Urinary tract infection. Patient having a positive UA, started on IV ceftriaxone. She has been afebrile, hemodynamically stable. Urine cultures are pending at the time of this dictation. Given clinical improvement, we'll discontinue IV ceftriaxone, start Ceftin by mouth.  3. Acute blood loss anemia. I be secondary to orthopedic surgery. Hemoglobin stable at 8.8 on 03/19/2013. 4. Advanced dementia. Stable, patient functioning at her baseline. 5. Hypothyroidism. Continue Synthroid 6. Constipation. Patient has not had a bowel movement since admission. I have added when necessary lactulose today.  Code Status: Full code Family Communication: Family members were not present at bedside during my encounter Disposition Plan: Plan for transition to skilled nursing facility for acute rehabilitation likely in the next 24 hours   Consultants:  Orthopedic surgery  Procedures:  Open reduction internal fixation of right intertrochanteric hip fracture performed on 03/18/2013  Antibiotics:  Ceftriaxone  HPI/Subjective: Patient with history of dementia, having a fall with resultant right hip fracture. Status post open reduction internal fixation, performed on 03/18/2013. Patient also admits with urinary tract infection, presently being treated with IV ceftriaxone. No acute events overnight, this morning she tolerated her breakfast, requesting tomato soup. Physical therapy working with her.  Objective: Filed Vitals:   03/20/13 0400  BP:   Pulse:   Temp:   Resp: 18    Intake/Output Summary (Last 24 hours) at 03/20/13 1031 Last  data filed at 03/20/13 0800  Gross per 24 hour  Intake 1047.5 ml  Output    450 ml  Net  597.5 ml   Filed Weights   03/18/13 0501  Weight: 60.8 kg (134 lb 0.6 oz)    Exam:   General:  Patient is in no acute distress she is awake alert, though confused and disoriented  Cardiovascular: Regular rate and rhythm normal S1-S2 no murmurs rubs or gallops  Respiratory: Normal respiratory effort, lungs are clear  Abdomen: Soft nontender nondistended  Musculoskeletal: Right lower extremity status post open reduction internal fixation  Skin: Surgical incision site appears clean, no evidence of hematoma or infection   Data Reviewed: Basic Metabolic Panel:  Recent Labs Lab 03/18/13 0155 03/18/13 0740 03/19/13 0550 03/20/13 0538  NA 135 136 137 132*  K 3.3* 3.8 3.9 4.7  CL 96 99 103 101  CO2 29 28 27  18*  GLUCOSE 124* 94 90 74  BUN 11 9 9 9   CREATININE 0.47* 0.45* 0.57 0.41*  CALCIUM 9.2 9.2 8.6 8.6   Liver Function Tests: No results found for this basename: AST, ALT, ALKPHOS, BILITOT, PROT, ALBUMIN,  in the last 168 hours No results found for this basename: LIPASE, AMYLASE,  in the last 168 hours No results found for this basename: AMMONIA,  in the last 168 hours CBC:  Recent Labs Lab 03/18/13 0155 03/18/13 0740 03/19/13 0550 03/19/13 1957  WBC 6.0 6.1 6.5  --   NEUTROABS 3.6  --   --   --   HGB 11.4* 10.3* 7.8* 8.8*  HCT 33.8* 30.9* 23.2* 25.8*  MCV 86.0 86.8 87.2  --   PLT 297 280 220  --    Cardiac Enzymes: No results found for this basename: CKTOTAL, CKMB, CKMBINDEX, TROPONINI,  in the last 168 hours  BNP (last 3 results) No results found for this basename: PROBNP,  in the last 8760 hours CBG: No results found for this basename: GLUCAP,  in the last 168 hours  Recent Results (from the past 240 hour(s))  MRSA PCR SCREENING     Status: None   Collection Time    03/22/2013  5:12 AM      Result Value Range Status   MRSA by PCR NEGATIVE  NEGATIVE Final    Comment:            The GeneXpert MRSA Assay (FDA     approved for NASAL specimens     only), is one component of a     comprehensive MRSA colonization     surveillance program. It is not     intended to diagnose MRSA     infection nor to guide or     monitor treatment for     MRSA infections.  URINE CULTURE     Status: None   Collection Time    March 22, 2013 12:58 PM      Result Value Range Status   Specimen Description URINE, CATHETERIZED   Final   Special Requests PT ON VANCOMYCIN   Final   Culture  Setup Time     Final   Value: 2013/03/22 15:30     Performed at [facility] Count PENDING   Incomplete   Culture     Final   Value: Culture reincubated for better growth     Performed at [facility]   Report Status PENDING   Incomplete     Studies: Dg Hip Operative Right  March 22, 2013   CLINICAL DATA:  Right hip fracture.  EXAM: DG OPERATIVE RIGHT HIP  TECHNIQUE: A single spot fluoroscopic AP image of the right hip is submitted.  COMPARISON:  Preoperative study of earlier today at 218 hr.  FINDINGS: A total of 4 intraoperative views. These demonstrate placement of a rod and screw fixation device within the proximal right femur. No acute hardware complication.  IMPRESSION: Intraoperative imaging of proximal right femoral fixation.   Electronically Signed   By: Jeronimo Greaves   On: 03/22/13 15:00   Dg Pelvis Portable  03/22/2013   CLINICAL DATA:  Postop  EXAM: PORTABLE PELVIS  COMPARISON:  CT abdomen pelvis 01/05/2013  FINDINGS: There is no evidence of pelvic fracture or diastasis. No other pelvic bone lesions are seen. There are postoperative changes of right dynamic hip screw and intramedullary rod in the proximal right femur. No complicating features identified. Expected locules of subcutaneous gas are noted. The bones are diffusely osteopenic.  IMPRESSION: ORIF of the proximal right femur.  No acute abnormality identified.   Electronically Signed   By: Britta Mccreedy    On: 2013/03/22 15:11    Scheduled Meds: . cefTRIAXone (ROCEPHIN)  IV  1 g Intravenous Q24H  . docusate sodium  100 mg Oral BID  . enoxaparin (LOVENOX) injection  40 mg Subcutaneous Q24H  . levothyroxine  25 mcg Oral QAC breakfast  . polyethylene glycol  17 g Oral Daily  . sodium chloride  3 mL Intravenous Q12H   Continuous Infusions:   Principal Problem:   Hip fracture, right Active Problems:   HYPERTENSION   Hypothyroidism    Time spent: 35 minutes    Jeralyn Bennett  [facility] Pager [phone]. If 7PM-7AM, please contact night-coverage at [email or web address], password [facility] 03/20/2013, 10:31 AM  LOS: 2 days

## 2013-03-20 NOTE — Progress Notes (Signed)
[name]  MRN: [id number] DOB/Age: 06/27/1930 79 y.o. Physician: Lynnea Maizes, M.D. 2 Days Post-Op Procedure(s) (LRB): INTRAMEDULLARY (IM) NAIL FEMORAL (Right)  Subjective: C/o right hip discomfort, fair appetite Vital Signs Temp:  [98.4 F (36.9 C)-99.5 F (37.5 C)] 98.9 F (37.2 C) (09/24 0350) Pulse Rate:  [69-89] 79 (09/24 0350) Resp:  [16-18] 18 (09/24 0800) BP: (93-127)/(50-64) 111/63 mmHg (09/24 0350) SpO2:  [95 %-100 %] 95 % (09/24 0800)  Lab Results  Recent Labs  03/19/13 0550 03/19/13 1957 03/20/13 1123  WBC 6.5  --  6.9  HGB 7.8* 8.8* 9.9*  HCT 23.2* 25.8* 29.3*  PLT 220  --  230   BMET  Recent Labs  03/19/13 0550 03/20/13 0538  NA 137 132*  K 3.9 4.7  CL 103 101  CO2 27 18*  GLUCOSE 90 74  BUN 9 9  CREATININE 0.57 0.41*  CALCIUM 8.6 8.6   INR  Date Value Range Status  03/18/2013 1.04  0.00 - 1.49 Final     Exam  Thigh soft, dressing dry, N/V intact distally  Plan Mobilize with PT, SW assisting with d/c planning  Kamir Selover M 03/20/2013, 12:43 PM

## 2013-03-20 NOTE — Evaluation (Addendum)
Occupational Therapy Evaluation Patient Details Name: [name] MRN: [id number] DOB: 10-Apr-1930 Today's Date: 03/20/2013 Time: 4098-1191 OT Time Calculation (min): 22 min  OT Assessment / Plan / Recommendation History of present illness [name] w/ pmhx significant for HTN, urinary incontinence, RA, hypothyroidism, osteoporosis, hx psychoses, presents due to fall. The patient is a poor historian, but states she just "slipped" from her bed. She denies any LOC, CP, SOB.  Per report, she comes from [facility] after having this fall around midnight. Pt with right femur fx s/p ORIF   Clinical Impression   Pt requires total A with ADLs/selfcare, functional mobility and self feeding. Pt previously at ALF where staff was providing assist and plans to return there once medically ok. Pt not appropriate for acute OT services at this time and may need SNF for further care and therapies if ALF unable to provide extensive assist and therapy    OT Assessment  All further OT needs can be met in the next venue of care    Follow Up Recommendations  SNF;Supervision/Assistance - 24 hour    Barriers to Discharge  None    Equipment Recommendations  None recommended by OT    Recommendations for Other Services    Frequency       Precautions / Restrictions Precautions Precautions: Fall Restrictions RLE Weight Bearing: Weight bearing as tolerated   Pertinent Vitals/Pain 8/10 with movement/activity    ADL  Grooming: +1 Total assistance Upper Body Bathing: +1 Total assistance Lower Body Bathing: +1 Total assistance Upper Body Dressing: +1 Total assistance Lower Body Dressing: +1 Total assistance Tub/Shower Transfer Method: Not assessed ADL Comments: Pt is total A with ADLs and self feeding    OT Diagnosis: Generalized weakness;Acute pain  OT Problem List: Decreased strength;Decreased knowledge of use of DME or AE;Decreased activity tolerance;Impaired balance (sitting and/or  standing);Pain;Decreased coordination;Decreased range of motion;Impaired UE functional use OT Treatment Interventions:     OT Goals(Current goals can be found in the care plan section)    Visit Information  Last OT Received On: 03/20/13 Assistance Needed: +2 History of Present Illness: [name] w/ pmhx significant for HTN, urinary incontinence, RA, hypothyroidism, osteoporosis, hx psychoses, presents due to fall. The patient is a poor historian, but states she just "slipped" from her bed. She denies any LOC, CP, SOB.  Per report, she comes from [facility] after having this fall around midnight. Pt with right femur fx s/p ORIF       Prior Functioning     Home Living Family/patient expects to be discharged to:: Assisted living Home Equipment: Walker - 2 wheels;Wheelchair - manual Additional Comments: Pt reports she receives A with ADL activity.  Pt states she also gets help eating ( hands very arthritic) Prior Function Level of Independence: Needs assistance ADL's / Homemaking Assistance Needed: Staff at [facility] report pt is total assist for all ADLs and transfers. She was walking with assist months ago but since that time has not ambulated and is transferred to a WC Communication Communication: No difficulties Dominant Hand: Right         Vision/Perception Vision - History Baseline Vision: Wears glasses only for reading Patient Visual Report: No change from baseline Perception Perception: Within Functional Limits   Cognition  Cognition Arousal/Alertness: Awake/alert Behavior During Therapy: Flat affect    Extremity/Trunk Assessment Upper Extremity Assessment Upper Extremity Assessment: Generalized weakness;RUE deficits/detail;LUE deficits/detail RUE Deficits / Details: arthritic hand, needs assist with all selfcare and  self feeding RUE Coordination: decreased fine motor;decreased gross motor LUE Deficits / Details: arthritic hand, needs assist with all  selfcare and self feeding, soreness in L shoulder with decreased AROM. PROM normal LUE Coordination: decreased fine motor;decreased gross motor Lower Extremity Assessment Lower Extremity Assessment: Defer to PT evaluation Cervical / Trunk Assessment Cervical / Trunk Assessment: Kyphotic     Mobility Bed Mobility Bed Mobility: Supine to Sit;Sitting - Scoot to [place] of Bed;Scooting to [facility];Sit to Supine Supine to Sit: 1: +2 Total assist;HOB elevated Supine to Sit: Patient Percentage: 0% Sitting - Scoot to Edge of Bed: 1: +1 Total assist Sit to Supine: 1: +2 Total assist;HOB flat Scooting to HOB: 1: +2 Total assist Transfers Transfers: Not assessed     Exercise     Balance Balance Balance Assessed: Yes Dynamic Standing Balance Dynamic Standing - Balance Support: No upper extremity supported;During functional activity Dynamic Standing - Level of Assistance: 3: Mod assist   End of Session OT - End of Session Activity Tolerance: Patient limited by fatigue;Patient limited by pain Patient left: in bed;with call bell/phone within reach Nurse Communication: Other (comment) (needs staff to assist with feeding self lunch)  GO     Galen Manila 03/20/2013, 1:09 PM

## 2013-03-20 NOTE — Progress Notes (Signed)
Clinical Social Worker provided son with skilled nursing facility bed offers. Son appears open to SNF option and gave CSW permission to follow up with [facility]. CSW spoke with Admissions at [facility] and they stated that patient is okay to come when medically ready for dc. Patient has [employer]; CSW faxed out clinicals for insurance auth. Awaiting auth.  Will update further when new information arises.   Maree Krabbe, MSW, [facility]ors [phone]

## 2013-03-20 NOTE — Progress Notes (Signed)
1 incontinent episode while bladder scanning.  Bladder scan showed .  Bladder firm and distended.  After incontinent episode, bladder scan showed .  Bladder remains firm and distended.  Denies other complaints.  Discussed with Donnamarie Poag, NP, given verbal order to in and out catheter.  urine out along with another incontinent episode.  Tolerated well, will continue to monitor.   Shelbie Ammons, RN

## 2013-03-20 NOTE — Progress Notes (Signed)
Pt. Due to void at 2300 on 9/23 pt. Tried to void using fracture pan with no success.  9/24 0130 bladders scanner indicated on 34ml, pt. Had 240 ml of tomato soup and of tea.  Will re-access in an hour.  Will continue to monitor.    Thane Edu, RN

## 2013-03-21 ENCOUNTER — Inpatient Hospital Stay (HOSPITAL_COMMUNITY): Payer: Medicare PPO

## 2013-03-21 DIAGNOSIS — W07XXXA Fall from chair, initial encounter: Secondary | ICD-10-CM

## 2013-03-21 LAB — CBC
HCT: 29.5 % — ABNORMAL LOW (ref 36.0–46.0)
Hemoglobin: 9.8 g/dL — ABNORMAL LOW (ref 12.0–15.0)
MCV: 87.5 fL (ref 78.0–100.0)
RBC: 3.37 MIL/uL — ABNORMAL LOW (ref 3.87–5.11)
RDW: 14.1 % (ref 11.5–15.5)
WBC: 5.4 10*3/uL (ref 4.0–10.5)

## 2013-03-21 LAB — URINE CULTURE: Colony Count: >=100000

## 2013-03-21 MED ORDER — MILK AND MOLASSES ENEMA
Freq: Once | RECTAL | Status: AC
  Administered 2013-03-21: 17:00:00 via RECTAL
  Filled 2013-03-21: qty 250

## 2013-03-21 NOTE — Progress Notes (Addendum)
CSW updated facility about dc possibly tomorrow. Facility stated that they will have a bed tomorrow. CSW also called son and updated him to let him know that dc is possibly tomorrow. Son stated that was fine and to call him tomorrow when patient is going to be transferred. CSW spoke with [facility] about needing SNF auth. [employer] stated that it has been started and to call back tomorrow when patient is ready for dc to get SNF auth.  Maree Krabbe, MSW, [facility]resia Majors [phone]

## 2013-03-21 NOTE — Progress Notes (Signed)
Nurse tech saw patient's bed alarm light flashing in hallway. Patient found laying in floor. Patient states she doesn't know what happened. "I was just in the floor." Patient states she hit her head and states her left arm hurts. No cuts, bleeding or bruising noted. Patient orientation is at baseline. Patient assisted back to bed via [name] x4 RNs. Bed alarm reset to first setting and volume adjusted to loudest setting. Call bell near. Will continue to monitor. Dr. Vanessa Barbara notified; will see patient.[name]

## 2013-03-21 NOTE — Progress Notes (Signed)
[facility] PROGRESS NOTE  [name]:[id number] DOB: 18-May-1930 DOA: 03/18/2013 PCP: No primary provider on file.  Assessment/Plan: 1. Right hip fracture. Status post open reduction internal fixation, continue DVT prophylaxis with Lovenox. Physical therapy and social work consulted. Plan for discharge to skilled nursing facility for rehabilitation. 2. Status post fall. Patient having a fall this morning, on my assessment appears to have left shoulder pain for which I have ordered a left shoulder x-ray. Left lower extremity preserved range of motion. With regard to right lower extremity, patient does not report worsening pain of hip. She states "that leg is the same."  3. Urinary tract infection. Patient having a positive UA, started on IV ceftriaxone. She has been afebrile, hemodynamically stable. Urine cultures are pending at the time of this dictation. Given clinical improvement, we'll discontinue IV ceftriaxone, start Ceftin by mouth.  4. Acute blood loss anemia. Likely secondary to orthopedic surgery. . 5. Advanced dementia. Stable, patient functioning at her baseline. 6. Hypothyroidism. Continue Synthroid 7. Constipation. Patient has not had a bowel movement since admission. Despite adding lactulose, just about had a bowel movement for which I ordered a milk of molasses enema.   Code Status: Full code Family Communication: Family members were not present at bedside during my encounter Disposition Plan: Plan for transition to skilled nursing facility for acute rehabilitation likely in the next 24 hours   Consultants:  Orthopedic surgery  Procedures:  Open reduction internal fixation of right intertrochanteric hip fracture performed on 03/18/2013  Antibiotics:  Ceftriaxone  HPI/Subjective: Patient having a fall this morning, landing on her left side. After the fall she reported having pain to her left shoulder. I assessed patient at bedside, she did not reports  that the pain to passive and active movement of her left lower extremity however did report pain to abduction of her left upper extremity. Pending is a shoulder x-ray. Meanwhile there are no mental status changes, she is alert oriented in no acute distress. Patient tolerated her breakfast this morning. Still has not had a bowel movement for which I ordered milk of molasses enema.  Objective: Filed Vitals:   03/21/13 0920  BP: 158/89  Pulse: 103  Temp: 98.7 F (37.1 C)  Resp: 20    Intake/Output Summary (Last 24 hours) at 03/21/13 1116 Last data filed at 03/21/13 0800  Gross per 24 hour  Intake    840 ml  Output      0 ml  Net    840 ml   Filed Weights   03/18/13 0501  Weight: 60.8 kg (134 lb 0.6 oz)    Exam:   General:  Patient is in no acute distress she is awake alert, after evaluating her post fall, she is awake alert, reporting some left shoulder pain.  Cardiovascular: Regular rate and rhythm normal S1-S2 no murmurs rubs or gallops  Respiratory: Normal respiratory effort, lungs are clear  Abdomen: Soft nontender nondistended  Musculoskeletal: Right lower extremity status post open reduction internal fixation, there is preserved range of motion to her left lower extremity, patient reporting pain with elevation and abduction of her left upper extremity  Skin: Surgical incision site appears clean, no evidence of hematoma or infection   Data Reviewed: Basic Metabolic Panel:  Recent Labs Lab 03/18/13 0155 03/18/13 0740 03/19/13 0550 03/20/13 0538  NA 135 136 137 132*  K 3.3* 3.8 3.9 4.7  CL 96 99 103 101  CO2 29 28 27  18*  GLUCOSE 124* 94 90 74  BUN 11 9 9 9   CREATININE 0.47* 0.45* 0.57 0.41*  CALCIUM 9.2 9.2 8.6 8.6   Liver Function Tests: No results found for this basename: AST, ALT, ALKPHOS, BILITOT, PROT, ALBUMIN,  in the last 168 hours No results found for this basename: LIPASE, AMYLASE,  in the last 168 hours No results found for this basename:  AMMONIA,  in the last 168 hours CBC:  Recent Labs Lab 03/18/13 0155 03/18/13 0740 03/19/13 0550 03/19/13 1957 03/20/13 1123 03/21/13 0610  WBC 6.0 6.1 6.5  --  6.9 5.4  NEUTROABS 3.6  --   --   --   --   --   HGB 11.4* 10.3* 7.8* 8.8* 9.9* 9.8*  HCT 33.8* 30.9* 23.2* 25.8* 29.3* 29.5*  MCV 86.0 86.8 87.2  --  87.2 87.5  PLT 297 280 220  --  230 238   Cardiac Enzymes: No results found for this basename: CKTOTAL, CKMB, CKMBINDEX, TROPONINI,  in the last 168 hours BNP (last 3 results) No results found for this basename: PROBNP,  in the last 8760 hours CBG: No results found for this basename: GLUCAP,  in the last 168 hours  Recent Results (from the past 240 hour(s))  MRSA PCR SCREENING     Status: None   Collection Time    03/18/13  5:12 AM      Result Value Range Status   MRSA by PCR NEGATIVE  NEGATIVE Final   Comment:            The GeneXpert MRSA Assay (FDA     approved for NASAL specimens     only), is one component of a     comprehensive MRSA colonization     surveillance program. It is not     intended to diagnose MRSA     infection nor to guide or     monitor treatment for     MRSA infections.  URINE CULTURE     Status: None   Collection Time    03/18/13 12:58 PM      Result Value Range Status   Specimen Description URINE, CATHETERIZED   Final   Special Requests PT ON VANCOMYCIN   Final   Culture  Setup Time     Final   Value: 03/18/2013 15:30     Performed at [facility] Count     Final   Value: >=100,000 COLONIES/ML     Performed at [facility]   Culture     Final   Value: ESCHERICHIA COLI     Performed at [facility]   Report Status 03/21/2013 FINAL   Final   Organism ID, Bacteria ESCHERICHIA COLI   Final  URINE CULTURE     Status: None   Collection Time    03/19/13  7:53 AM      Result Value Range Status   Specimen Description URINE, CATHETERIZED   Final   Special Requests NONE   Final   Culture  Setup Time      Final   Value: 03/19/2013 08:56     Performed at [facility] Count     Final   Value: NO GROWTH     Performed at [facility]   Culture     Final   Value: NO GROWTH     Performed at [facility]   Report Status 03/20/2013 FINAL   Final     Studies: No results found.  Scheduled Meds: . cefUROXime  250 mg Oral BID WC  . docusate sodium  100 mg Oral BID  . enoxaparin (LOVENOX) injection  40 mg Subcutaneous Q24H  . levothyroxine  25 mcg Oral QAC breakfast  . milk and molasses   Rectal Once  . polyethylene glycol  17 g Oral Daily  . sodium chloride  3 mL Intravenous Q12H   Continuous Infusions:   Principal Problem:   Hip fracture, right Active Problems:   HYPERTENSION   Hypothyroidism    Time spent: 35 minutes    Jeralyn Bennett  [facility] Pager [phone]. If 7PM-7AM, please contact night-coverage at [email or web address], password [facility] 03/21/2013, 11:16 AM  LOS: 3 days

## 2013-03-21 NOTE — Progress Notes (Signed)
Subjective: 3 Days Post-Op Procedure(s) (LRB): INTRAMEDULLARY (IM) NAIL FEMORAL (Right) Patient reports pain as 4 on 0-10 scale.    Objective: Vital signs in last 24 hours: Temp:  [98 F (36.7 C)-98.7 F (37.1 C)] 98.7 F (37.1 C) (09/25 0920) Pulse Rate:  [64-103] 103 (09/25 0920) Resp:  [16-20] 20 (09/25 0920) BP: (113-158)/(59-89) 158/89 mmHg (09/25 0920) SpO2:  [98 %-100 %] 99 % (09/25 0920)  Intake/Output from previous day: 09/24 0701 - 09/25 0700 In: 1080 [P.O.:1080] Out: -  Intake/Output this shift: Total I/O In: 360 [P.O.:360] Out: 250 [Urine:250]   Recent Labs  03/19/13 0550 03/19/13 1957 03/20/13 1123 03/21/13 0610  HGB 7.8* 8.8* 9.9* 9.8*    Recent Labs  03/20/13 1123 03/21/13 0610  WBC 6.9 5.4  RBC 3.36* 3.37*  HCT 29.3* 29.5*  PLT 230 238    Recent Labs  03/19/13 0550 03/20/13 0538  NA 137 132*  K 3.9 4.7  CL 103 101  CO2 27 18*  BUN 9 9  CREATININE 0.57 0.41*  GLUCOSE 90 74  CALCIUM 8.6 8.6   No results found for this basename: LABPT, INR,  in the last 72 hours  Intact pulses distally Dorsiflexion/Plantar flexion intact Incision: dressing C/D/I and no drainage No cellulitis present Compartment soft Dressings changed without difficulty  Assessment/Plan: 3 Days Post-Op Procedure(s) (LRB): INTRAMEDULLARY (IM) NAIL FEMORAL (Right) Plan for discharge tomorrow Please contact if any positive results on left shoulder xray   ROBERTS,JANE B 03/21/2013, 1:05 PM [phone]

## 2013-03-22 DIAGNOSIS — N390 Urinary tract infection, site not specified: Secondary | ICD-10-CM

## 2013-03-22 DIAGNOSIS — K5900 Constipation, unspecified: Secondary | ICD-10-CM

## 2013-03-22 DIAGNOSIS — M069 Rheumatoid arthritis, unspecified: Secondary | ICD-10-CM

## 2013-03-22 MED ORDER — ENOXAPARIN SODIUM 40 MG/0.4ML ~~LOC~~ SOLN: 40.0000 mg | SUBCUTANEOUS | Status: DC

## 2013-03-22 MED ORDER — HYDROCODONE-ACETAMINOPHEN 5-325 MG PO TABS: 1.0000 | ORAL_TABLET | Freq: Four times a day (QID) | ORAL | Status: DC | PRN

## 2013-03-22 MED ORDER — DSS 100 MG PO CAPS: 100.0000 mg | ORAL_CAPSULE | Freq: Two times a day (BID) | ORAL | Status: AC

## 2013-03-22 MED ORDER — ASPIRIN EC 81 MG PO TBEC: 81.0000 mg | DELAYED_RELEASE_TABLET | Freq: Every day | ORAL | Status: DC

## 2013-03-22 MED ORDER — LACTULOSE 10 GM/15ML PO SOLN: 20.0000 g | Freq: Two times a day (BID) | ORAL | Status: DC | PRN

## 2013-03-22 NOTE — Progress Notes (Addendum)
9:12 AM: CSW attempted to call son and update him about patient's discharge. CSW awaiting phone call back from family member.  CSW spoke to [employer] and facility. Still waiting on insurance auth. Will update when CSW gets the authorization number.   Maree Krabbe, MSW, [facility]ors [phone]

## 2013-03-22 NOTE — Progress Notes (Signed)
Follow up on Left shoulder xrays that were ordered. They are negative for fracture. OK to DC to NH to FU as outpatient in 2-3 weeks

## 2013-03-22 NOTE — Progress Notes (Signed)
CSW called for transportation by [facility]. CSW contacted son, son stated everything was okay to go ahead and call transportation. CSW signing off.   Maree Krabbe, MSW, [facility]ors [phone]

## 2013-03-22 NOTE — Progress Notes (Signed)
Physical Therapy Treatment Patient Details Name: [name] MRN: [id number] DOB: 1929/11/22 Today's Date: 03/22/2013 Time: 4098-1191 PT Time Calculation (min): 19 min  PT Assessment / Plan / Recommendation  History of Present Illness [name] w/ pmhx significant for HTN, urinary incontinence, RA, hypothyroidism, osteoporosis, hx psychoses, presents due to fall. The patient is a poor historian, but states she just "slipped" from her bed. She denies any LOC, CP, SOB.  Per report, she comes from [facility] after having this fall around midnight. Pt with right femur fx s/p ORIF   PT Comments   Pt able to better follow commands today and assist with mobility. Pt unaware of fall from yesterday and reiterated need for assist with all mobility. Pt repeatedly asking for assist with feeding but when cued able to perform without assist.  Follow Up Recommendations        Does the patient have the potential to tolerate intense rehabilitation     Barriers to Discharge        Equipment Recommendations       Recommendations for Other Services    Frequency     Progress towards PT Goals Progress towards PT goals: Progressing toward goals  Plan Current plan remains appropriate    Precautions / Restrictions Precautions Precautions: Fall Restrictions Weight Bearing Restrictions: Yes RLE Weight Bearing: Weight bearing as tolerated   Pertinent Vitals/Pain Pt reports soreness LUE    Mobility  Bed Mobility Bed Mobility: Rolling Right;Rolling Left Rolling Right: 2: Max assist Rolling Left: 2: Max assist Supine to Sit: 1: +2 Total assist;HOB flat Supine to Sit: Patient Percentage: 40% Sitting - Scoot to Edge of Bed: 3: Mod assist Sit to Supine: 2: Max assist;HOB flat Scooting to HOB: 1: +2 Total assist Scooting to [facility]: Patient Percentage: 0% Details for Bed Mobility Assistance: cueing for sequence, initiation and safety with max multimodal cues to complete, rolling with total  assist for pericare. Pivot with pad to EOB and return to supine from sidelying with assist for bil LE onto surface.  Transfers Transfers: Sit to Stand;Stand to Sit Sit to Stand: 1: +2 Total assist;From bed Sit to Stand: Patient Percentage: 20% Stand to Sit: 1: +2 Total assist;To bed Stand to Sit: Patient Percentage: 20% Details for Transfer Assistance: attempted sit to stand x 3 trials pt accepting some weight on her feet but even with bil knees blocked maintains flexion and unable to fully elevate sacrum Ambulation/Gait Ambulation/Gait Assistance: Not tested (comment)    Exercises     PT Diagnosis:    PT Problem List:   PT Treatment Interventions:     PT Goals (current goals can now be found in the care plan section)    Visit Information  Last PT Received On: 03/22/13 Assistance Needed: +2 History of Present Illness: [name] w/ pmhx significant for HTN, urinary incontinence, RA, hypothyroidism, osteoporosis, hx psychoses, presents due to fall. The patient is a poor historian, but states she just "slipped" from her bed. She denies any LOC, CP, SOB.  Per report, she comes from [facility] after having this fall around midnight. Pt with right femur fx s/p ORIF    Subjective Data      Cognition  Cognition Arousal/Alertness: Awake/alert Behavior During Therapy: Flat affect Overall Cognitive Status: History of cognitive impairments - at baseline    Balance  Static Sitting Balance Static Sitting - Balance Support: No upper extremity supported;Feet supported Static Sitting - Level of Assistance: 5: Stand by  assistance Static Sitting - Comment/# of Minutes: 6  End of Session PT - End of Session Activity Tolerance: Patient tolerated treatment well Patient left: in bed;with call bell/phone within reach;with bed alarm set Nurse Communication: Mobility status   GP     Toney Sang [facility] 03/22/2013, 10:17 AM [name], PT [phone]

## 2013-03-22 NOTE — Progress Notes (Signed)
CSW spoke to son Channing Mutters about dc planning. Son was in agreement to [employer] by EMS to [facility]. CSW is arranging transportation and will update family member and patient when transport is arranged.  Maree Krabbe, MSW, Theresia Majors [phone]

## 2013-03-22 NOTE — Progress Notes (Signed)
Report called to charge RN at [facility].  Will communicate with SW that Pt is ready for transport.

## 2013-03-22 NOTE — Progress Notes (Signed)
Clinical Social Work Department CLINICAL SOCIAL WORK PLACEMENT NOTE 03/22/2013  Patient:  SUHAILAH, KWAN  Account Number:  [id number] Admit date:  03/18/2013  Clinical Social Worker:  Carren Rang  Date/time:  03/19/2013 01:26 PM  Clinical Social Work is seeking post-discharge placement for this patient at the following level of care:   SKILLED NURSING   (*CSW will update this form in Epic as items are completed)   03/19/2013  Patient/family provided with [facility] Department of Clinical Social Work's list of facilities offering this level of care within the geographic area requested by the patient (or if unable, by the patient's family).  03/19/2013  Patient/family informed of their freedom to choose among providers that offer the needed level of care, that participate in Medicare, Medicaid or managed care program needed by the patient, have an available bed and are willing to accept the patient.  03/19/2013  Patient/family informed of [facility]' ownership interest in [facility], as well as of the fact that they are under no obligation to receive care at this facility.  PASARR submitted to EDS on  PASARR number received from EDS on   FL2 transmitted to all facilities in geographic area requested by pt/family on  03/19/2013 FL2 transmitted to all facilities within larger geographic area on   Patient informed that his/her managed care company has contracts with or will negotiate with  certain facilities, including the following:     Patient/family informed of bed offers received:  03/20/2013 Patient chooses bed at [facility] Physician recommends and patient chooses bed at    Patient to be transferred to [facility] on  03/22/2013 Patient to be transferred to facility by EMS  The following physician request were entered in Epic:   Additional Comments:  Maree Krabbe, MSW, [employer] [phone]

## 2013-03-22 NOTE — Discharge Summary (Signed)
Physician Discharge Summary  [name]:[id number] DOB: May 29, 1930 DOA: 03/18/2013  PCP: No primary provider on file.  Admit date: 03/18/2013 Discharge date: 03/22/2013  Time spent: 30 minutes  Recommendations for Outpatient Follow-up:  1. Patient at high risk for falls, having a fall during this hospitalization. Will need close monitoring at her facility.   Discharge Diagnoses:  Principal Problem:   Hip fracture, right Active Problems:   HYPERTENSION   Hypothyroidism   Unspecified constipation   UTI (urinary tract infection)   Discharge Condition: Stable and improved  Diet recommendation: Heart healthy  Filed Weights   03/18/13 0501  Weight: 60.8 kg (134 lb 0.6 oz)    History of present illness:  [name] w/ pmhx significant for HTN, urinary incontinence, RA, hypothyroidism, osteoporosis, hx psychoses, presents due to fall. The patient is a poor historian, but states she just "slipped" from her bed. She denies any LOC, CP, SOB. Per report, she comes from [facility] after having this fall around midnight. She was noted to have pain in her RLE and external rotation of the distal RLE.  Currently, she admits to mild pain around right hip. She denies any SOB, CP, dizziness, abdominal pain. She had a hip xray performed in the ED which showed evidence of a proximal right femur fracture involving the greater trochanter, likely intertrochanteric.  Dr. Dierdre Highman has notified orthopedics on call.   Hospital Course:  Patient is a pleasant 79 year old female with a history of dementia, osteoporosis, chronic pain syndrome, narcotic dependence who was transferred to the emergent apartment from her skilled nursing facility after sustaining a fall. Imaging studies in the emergency department revealed a proximal right femur fracture involving the greater trochanter. Fracture likely to be intertrochanteric. Orthopedic surgery was consulted as she underwent ORIF on 03/19/2013. She  tolerated the procedure were no immediate complications. During this hospitalization she was found to have urinary tract infection treated with ceftriaxone 1 g IV every 24 hours. This hospitalization was complicated by the development of a fall. This occurred on the morning of 03/20/2013. On my assessment, she had present range of motion to her left lower extremity without significant pain. Her right lower extremity had no change. She did report having left shoulder pain for which I obtained an x-ray, fortunately showing no evidence of fracture or dislocation. Patient was To the hospital for an additional 24 hours for observation. She tolerated by mouth intake, and remained stable thereafter. Patient to be discharged to skilled nursing facility for rehabilitation.  Procedures:  ORIF of right hip fracture  Consultations:  Orthopedic surgery  Physical therapy  Discharge Exam: Filed Vitals:   03/22/13 0444  BP: 144/66  Pulse: 72  Temp: 98.1 F (36.7 C)  Resp: 18    General: She is in no acute distress, reports some ongoing soreness to her left shoulder however improved today. Has no other complaint Cardiovascular: regular rate and rhythm normal S1-S2 Respiratory: lungs are clear to auscultation bilaterally no wheezing rhonchi or rales Abdomen: soft nontender nondistended Extremity: Patient has multiple anatomical deformities related to rheumatoid arthritis involving bilateral hands and feet.  Skin: Surgical incision site appears clean, no evidence of hematoma or infection  Discharge Instructions  Discharge Orders   Future Orders Complete By Expires   Call MD for:  difficulty breathing, headache or visual disturbances  As directed    Call MD for:  persistant dizziness or light-headedness  As directed    Call MD for:  persistant nausea and  vomiting  As directed    Call MD for:  redness, tenderness, or signs of infection (pain, swelling, redness, odor or green/yellow discharge around  incision site)  As directed    Call MD for:  severe uncontrolled pain  As directed    Call MD for:  temperature >100.4  As directed    Diet - low sodium heart healthy  As directed    Discharge instructions  As directed    Comments:     Keep follow up appointments Patient at a high fall risk, had a fall during this hospitalization. Will need close monitoring.   Full weight bearing  As directed    Increase activity slowly  As directed        Medication List    STOP taking these medications       diphenhydramine-acetaminophen 25-500 MG Tabs  Commonly known as:  TYLENOL PM     guaiFENesin 100 MG/5ML Soln  Commonly known as:  ROBITUSSIN     HYDROcodone-acetaminophen 10-325 MG per tablet  Commonly known as:  NORCO  Replaced by:  HYDROcodone-acetaminophen 5-325 MG per tablet     loperamide 2 MG capsule  Commonly known as:  IMODIUM     OLANZapine 10 MG tablet  Commonly known as:  ZYPREXA     sodium chloride 1 G tablet     torsemide 20 MG tablet  Commonly known as:  DEMADEX      TAKE these medications       acetaminophen 500 MG tablet  Commonly known as:  TYLENOL  Take 500 mg by mouth every 6 (six) hours as needed for pain.     alum & mag hydroxide-simeth 200-200-20 MG/5ML suspension  Commonly known as:  MAALOX/MYLANTA  Take 30 mLs by mouth every 6 (six) hours as needed for indigestion.     aspirin EC 81 MG tablet  Take 1 tablet (81 mg total) by mouth daily.     DSS 100 MG Caps  Take 100 mg by mouth 2 (two) times daily.     enoxaparin 40 MG/0.4ML injection  Commonly known as:  LOVENOX  Inject 0.4 mLs (40 mg total) into the skin daily.     HYDROcodone-acetaminophen 5-325 MG per tablet  Commonly known as:  NORCO/VICODIN  Take 1-2 tablets by mouth every 6 (six) hours as needed.     lactulose 10 GM/15ML solution  Commonly known as:  CHRONULAC  Take 30 mLs (20 g total) by mouth 2 (two) times daily as needed (Constipation).     levothyroxine 25 MCG tablet   Commonly known as:  SYNTHROID, LEVOTHROID  Take 25 mcg by mouth daily before breakfast.     magnesium hydroxide 400 MG/5ML suspension  Commonly known as:  MILK OF MAGNESIA  Take 30 mLs by mouth at bedtime as needed for constipation.     polyethylene glycol packet  Commonly known as:  MIRALAX / GLYCOLAX  Take 17 g by mouth every 3 (three) days.       Allergies  Allergen Reactions  . Hydrochlorothiazide   . Leflunomide   . Propoxyphene And Methadone   . Remicade [Infliximab]   . Zinc   . Colchicine Anxiety  . Famotidine Anxiety  . Penicillins Anxiety       Follow-up Information   Follow up with [name], MD In 10 days.   Specialty:  Orthopedic Surgery   Contact information:   [address] [place] [address] [phone]  The results of significant diagnostics from this hospitalization (including imaging, microbiology, ancillary and laboratory) are listed below for reference.    Significant Diagnostic Studies: Dg Chest 1 View  03/18/2013   CLINICAL DATA:  Fall with pain  EXAM: CHEST - 1 VIEW  COMPARISON:  01/05/2013  FINDINGS: No cardiomegaly. Unchanged aortic tortuosity. Coronary arterial stenting.  Unchanged elevation of the left diaphragm. No infiltrate, edema, effusion, or pneumothorax. No evident fracture.  IMPRESSION: 1. No evidence of acute cardiopulmonary disease. 2. No evident fracture.   Electronically Signed   By: Tiburcio Pea   On: 03/18/2013 02:49   Dg Thoracic Spine 2 View  03/18/2013   CLINICAL DATA:  Fall with back and leg pain.  EXAM: THORACIC SPINE - 2 VIEW  COMPARISON:  CHEST x-ray 06/14/2011.  FINDINGS: Remote T11 superior endplate compression deformity. No acute fracture identified. No subluxation. Mild dextro curvature of the upper thoracic spine. Osteopenia.  IMPRESSION: 1. No acute fracture detected. 2. Remote T11 compression fracture.   Electronically Signed   By: Tiburcio Pea   On: 03/18/2013 02:51   Dg Hip  Complete Left  03/18/2013   CLINICAL DATA:  79 year old female status post fall with pain.  EXAM: LEFT HIP - COMPLETE 2+ VIEW  COMPARISON:  Pelvis and right hip series 0 218 hr the same day and earlier.  FINDINGS: Th left femoral head is normally located. Bone mineralization is within normal limits for age. Visible pelvis appears intact. Proximal left femur intact.  IMPRESSION: No acute fracture or dislocation identified about the left hip. See right hip series from the same day reported separately.   Electronically Signed   By: Augusto Gamble M.D.   On: 03/18/2013 09:46   Dg Hip Complete Right  03/18/2013   CLINICAL DATA:  Fall with right leg pain  EXAM: RIGHT HIP - COMPLETE 2+ VIEW  COMPARISON:  None.  FINDINGS: There is a lucency through the proximal right femur, involving the greater trochanter at least, extending distally towards the shaft and lesser trochanter. No displacement is evident. The femoral head is located. No evidence of acute pelvic ring fracture, although there is distortion by rightward rotation.  IMPRESSION: Proximal right femur fracture involving the greater trochanter. The fracture is likely intertrochanteric, but the exact extent is uncertain, suggest MRI or CT.   Electronically Signed   By: Tiburcio Pea   On: 03/18/2013 02:47   Dg Hip Operative Right  03/18/2013   CLINICAL DATA:  Right hip fracture.  EXAM: DG OPERATIVE RIGHT HIP  TECHNIQUE: A single spot fluoroscopic AP image of the right hip is submitted.  COMPARISON:  Preoperative study of earlier today at 218 hr.  FINDINGS: A total of 4 intraoperative views. These demonstrate placement of a rod and screw fixation device within the proximal right femur. No acute hardware complication.  IMPRESSION: Intraoperative imaging of proximal right femoral fixation.   Electronically Signed   By: Jeronimo Greaves   On: 03/18/2013 15:00   Ct Head Wo Contrast  03/18/2013   CLINICAL DATA:  Status post fall.  EXAM: CT HEAD WITHOUT CONTRAST  CT  CERVICAL SPINE WITHOUT CONTRAST  TECHNIQUE: Multidetector CT imaging of the head and cervical spine was performed following the standard protocol without intravenous contrast. Multiplanar CT image reconstructions of the cervical spine were also generated.  COMPARISON:  Head and cervical spine CT scan 01/04/2013 and 10/24/2011.  FINDINGS: CT HEAD FINDINGS  Again seen are cortical atrophy and chronic microvascular ischemic change. No evidence  of acute intracranial abnormality including infarction, hemorrhage, mass lesion, mass effect, midline shift or abnormal extra-axial fluid collection is identified. No pneumocephalus or hydrocephalus. Calvarium intact.  CT CERVICAL SPINE FINDINGS  No fracture subluxation of the cervical spine is identified. Marked widening of the atlantodental interval is unchanged. Paraspinous soft tissue structures are unremarkable. The lung apices are clear  IMPRESSION: CT HEAD IMPRESSION  No acute intracranial abnormality. Stable compared to prior exam.  CT CERVICAL SPINE IMPRESSION  Negative for fracture or subluxation. Chronic widening of the atlantodental interval is unchanged.   Electronically Signed   By: Drusilla Kanner M.D.   On: 03/18/2013 02:53   Ct Cervical Spine Wo Contrast  03/18/2013   CLINICAL DATA:  Status post fall.  EXAM: CT HEAD WITHOUT CONTRAST  CT CERVICAL SPINE WITHOUT CONTRAST  TECHNIQUE: Multidetector CT imaging of the head and cervical spine was performed following the standard protocol without intravenous contrast. Multiplanar CT image reconstructions of the cervical spine were also generated.  COMPARISON:  Head and cervical spine CT scan 01/04/2013 and 10/24/2011.  FINDINGS: CT HEAD FINDINGS  Again seen are cortical atrophy and chronic microvascular ischemic change. No evidence of acute intracranial abnormality including infarction, hemorrhage, mass lesion, mass effect, midline shift or abnormal extra-axial fluid collection is identified. No pneumocephalus or  hydrocephalus. Calvarium intact.  CT CERVICAL SPINE FINDINGS  No fracture subluxation of the cervical spine is identified. Marked widening of the atlantodental interval is unchanged. Paraspinous soft tissue structures are unremarkable. The lung apices are clear  IMPRESSION: CT HEAD IMPRESSION  No acute intracranial abnormality. Stable compared to prior exam.  CT CERVICAL SPINE IMPRESSION  Negative for fracture or subluxation. Chronic widening of the atlantodental interval is unchanged.   Electronically Signed   By: Drusilla Kanner M.D.   On: 03/18/2013 02:53   Dg Pelvis Portable  03/18/2013   CLINICAL DATA:  Postop  EXAM: PORTABLE PELVIS  COMPARISON:  CT abdomen pelvis 01/05/2013  FINDINGS: There is no evidence of pelvic fracture or diastasis. No other pelvic bone lesions are seen. There are postoperative changes of right dynamic hip screw and intramedullary rod in the proximal right femur. No complicating features identified. Expected locules of subcutaneous gas are noted. The bones are diffusely osteopenic.  IMPRESSION: ORIF of the proximal right femur.  No acute abnormality identified.   Electronically Signed   By: Britta Mccreedy   On: 03/18/2013 15:11   Dg Shoulder Left  03/21/2013   CLINICAL DATA:  79 year old female with left shoulder pain after fall.  EXAM: LEFT SHOULDER - 2+ VIEW  COMPARISON:  Chest radiographs 01/05/2013.  FINDINGS: Proximal left humerus appears to remain intact. No glenohumeral joint dislocation. Left clavicle and scapula likewise appear intact. Osteopenia. No displaced fracture of the visible left ribs identified.  IMPRESSION: Osteopenia. No acute fracture or dislocation identified about the left shoulder. Follow-up films are recommended if symptoms persist.   Electronically Signed   By: Augusto Gamble M.D.   On: 03/21/2013 15:55    Microbiology: Recent Results (from the past 240 hour(s))  MRSA PCR SCREENING     Status: None   Collection Time    03/18/13  5:12 AM      Result  Value Range Status   MRSA by PCR NEGATIVE  NEGATIVE Final   Comment:            The GeneXpert MRSA Assay (FDA     approved for NASAL specimens     only), is one component of  a     comprehensive MRSA colonization     surveillance program. It is not     intended to diagnose MRSA     infection nor to guide or     monitor treatment for     MRSA infections.  URINE CULTURE     Status: None   Collection Time    03/18/13 12:58 PM      Result Value Range Status   Specimen Description URINE, CATHETERIZED   Final   Special Requests PT ON VANCOMYCIN   Final   Culture  Setup Time     Final   Value: 03/18/2013 15:30     Performed at [facility] Count     Final   Value: >=100,000 COLONIES/ML     Performed at [facility]   Culture     Final   Value: ESCHERICHIA COLI     Performed at [facility]   Report Status 03/21/2013 FINAL   Final   Organism ID, Bacteria ESCHERICHIA COLI   Final  URINE CULTURE     Status: None   Collection Time    03/19/13  7:53 AM      Result Value Range Status   Specimen Description URINE, CATHETERIZED   Final   Special Requests NONE   Final   Culture  Setup Time     Final   Value: 03/19/2013 08:56     Performed at [facility] Count     Final   Value: NO GROWTH     Performed at [facility]   Culture     Final   Value: NO GROWTH     Performed at [facility]   Report Status 03/20/2013 FINAL   Final     Labs: Basic Metabolic Panel:  Recent Labs Lab 03/18/13 0155 03/18/13 0740 03/19/13 0550 03/20/13 0538  NA 135 136 137 132*  K 3.3* 3.8 3.9 4.7  CL 96 99 103 101  CO2 29 28 27  18*  GLUCOSE 124* 94 90 74  BUN 11 9 9 9   CREATININE 0.47* 0.45* 0.57 0.41*  CALCIUM 9.2 9.2 8.6 8.6   Liver Function Tests: No results found for this basename: AST, ALT, ALKPHOS, BILITOT, PROT, ALBUMIN,  in the last 168 hours No results found for this basename: LIPASE, AMYLASE,  in the last 168 hours No  results found for this basename: AMMONIA,  in the last 168 hours CBC:  Recent Labs Lab 03/18/13 0155 03/18/13 0740 03/19/13 0550 03/19/13 1957 03/20/13 1123 03/21/13 0610  WBC 6.0 6.1 6.5  --  6.9 5.4  NEUTROABS 3.6  --   --   --   --   --   HGB 11.4* 10.3* 7.8* 8.8* 9.9* 9.8*  HCT 33.8* 30.9* 23.2* 25.8* 29.3* 29.5*  MCV 86.0 86.8 87.2  --  87.2 87.5  PLT 297 280 220  --  230 238   Cardiac Enzymes: No results found for this basename: CKTOTAL, CKMB, CKMBINDEX, TROPONINI,  in the last 168 hours BNP: BNP (last 3 results) No results found for this basename: PROBNP,  in the last 8760 hours CBG: No results found for this basename: GLUCAP,  in the last 168 hours     Signed:  Jeralyn Bennett  [facility] 03/22/2013, 7:32 AM

## 2013-06-18 ENCOUNTER — Inpatient Hospital Stay (HOSPITAL_COMMUNITY): Payer: Medicare PPO

## 2013-06-18 ENCOUNTER — Encounter (HOSPITAL_COMMUNITY): Payer: Self-pay | Admitting: Emergency Medicine

## 2013-06-18 ENCOUNTER — Inpatient Hospital Stay (HOSPITAL_COMMUNITY)
Admission: EM | Admit: 2013-06-18 | Discharge: 2013-06-26 | DRG: 844 | Disposition: A | Discharge status: Hospice | Payer: Medicare PPO | Attending: Internal Medicine | Admitting: Internal Medicine

## 2013-06-18 ENCOUNTER — Emergency Department (HOSPITAL_COMMUNITY): Payer: Medicare PPO

## 2013-06-18 DIAGNOSIS — M79609 Pain in unspecified limb: Secondary | ICD-10-CM | POA: Diagnosis present

## 2013-06-18 DIAGNOSIS — F0390 Unspecified dementia without behavioral disturbance: Secondary | ICD-10-CM | POA: Diagnosis present

## 2013-06-18 DIAGNOSIS — C786 Secondary malignant neoplasm of retroperitoneum and peritoneum: Secondary | ICD-10-CM

## 2013-06-18 DIAGNOSIS — R188 Other ascites: Secondary | ICD-10-CM | POA: Diagnosis present

## 2013-06-18 DIAGNOSIS — I2510 Atherosclerotic heart disease of native coronary artery without angina pectoris: Secondary | ICD-10-CM | POA: Diagnosis present

## 2013-06-18 DIAGNOSIS — M069 Rheumatoid arthritis, unspecified: Secondary | ICD-10-CM | POA: Diagnosis present

## 2013-06-18 DIAGNOSIS — E236 Other disorders of pituitary gland: Secondary | ICD-10-CM | POA: Diagnosis present

## 2013-06-18 DIAGNOSIS — R112 Nausea with vomiting, unspecified: Secondary | ICD-10-CM

## 2013-06-18 DIAGNOSIS — E871 Hypo-osmolality and hyponatremia: Secondary | ICD-10-CM | POA: Diagnosis present

## 2013-06-18 DIAGNOSIS — R5381 Other malaise: Secondary | ICD-10-CM | POA: Diagnosis present

## 2013-06-18 DIAGNOSIS — R1084 Generalized abdominal pain: Secondary | ICD-10-CM | POA: Diagnosis present

## 2013-06-18 DIAGNOSIS — R141 Gas pain: Secondary | ICD-10-CM | POA: Diagnosis present

## 2013-06-18 DIAGNOSIS — E860 Dehydration: Secondary | ICD-10-CM

## 2013-06-18 DIAGNOSIS — K862 Cyst of pancreas: Secondary | ICD-10-CM | POA: Diagnosis present

## 2013-06-18 DIAGNOSIS — G40909 Epilepsy, unspecified, not intractable, without status epilepticus: Secondary | ICD-10-CM | POA: Diagnosis present

## 2013-06-18 DIAGNOSIS — E039 Hypothyroidism, unspecified: Secondary | ICD-10-CM | POA: Diagnosis present

## 2013-06-18 DIAGNOSIS — F411 Generalized anxiety disorder: Secondary | ICD-10-CM | POA: Diagnosis present

## 2013-06-18 DIAGNOSIS — K5900 Constipation, unspecified: Secondary | ICD-10-CM | POA: Diagnosis present

## 2013-06-18 DIAGNOSIS — R109 Unspecified abdominal pain: Secondary | ICD-10-CM

## 2013-06-18 DIAGNOSIS — R971 Elevated cancer antigen 125 [CA 125]: Secondary | ICD-10-CM | POA: Diagnosis present

## 2013-06-18 DIAGNOSIS — E559 Vitamin D deficiency, unspecified: Secondary | ICD-10-CM | POA: Diagnosis present

## 2013-06-18 DIAGNOSIS — C762 Malignant neoplasm of abdomen: Secondary | ICD-10-CM | POA: Diagnosis present

## 2013-06-18 DIAGNOSIS — Z515 Encounter for palliative care: Secondary | ICD-10-CM

## 2013-06-18 DIAGNOSIS — I10 Essential (primary) hypertension: Secondary | ICD-10-CM | POA: Diagnosis present

## 2013-06-18 DIAGNOSIS — M810 Age-related osteoporosis without current pathological fracture: Secondary | ICD-10-CM | POA: Diagnosis present

## 2013-06-18 DIAGNOSIS — C801 Malignant (primary) neoplasm, unspecified: Principal | ICD-10-CM

## 2013-06-18 DIAGNOSIS — Z7982 Long term (current) use of aspirin: Secondary | ICD-10-CM

## 2013-06-18 DIAGNOSIS — R180 Malignant ascites: Secondary | ICD-10-CM | POA: Diagnosis present

## 2013-06-18 DIAGNOSIS — R609 Edema, unspecified: Secondary | ICD-10-CM

## 2013-06-18 DIAGNOSIS — R142 Eructation: Secondary | ICD-10-CM | POA: Diagnosis present

## 2013-06-18 LAB — CBC WITH DIFFERENTIAL/PLATELET
Basophils Absolute: 0 10*3/uL (ref 0.0–0.1)
Basophils Relative: 0 % (ref 0–1)
Eosinophils Absolute: 0 10*3/uL (ref 0.0–0.7)
Eosinophils Relative: 0 % (ref 0–5)
Lymphs Abs: 0.7 10*3/uL (ref 0.7–4.0)
MCH: 29 pg (ref 26.0–34.0)
MCHC: 34.4 g/dL (ref 30.0–36.0)
Neutro Abs: 6.5 10*3/uL (ref 1.7–7.7)
Neutrophils Relative %: 83 % — ABNORMAL HIGH (ref 43–77)
Platelets: 441 10*3/uL — ABNORMAL HIGH (ref 150–400)
RBC: 4.17 MIL/uL (ref 3.87–5.11)
RDW: 14.6 % (ref 11.5–15.5)

## 2013-06-18 LAB — URINE MICROSCOPIC-ADD ON

## 2013-06-18 LAB — PROTIME-INR

## 2013-06-18 LAB — COMPREHENSIVE METABOLIC PANEL
ALT: <5 U/L (ref 0–35)
AST: 13 U/L (ref 0–37)
Albumin: 2.5 g/dL — ABNORMAL LOW (ref 3.5–5.2)
Alkaline Phosphatase: 70 U/L (ref 39–117)
Calcium: 9 mg/dL (ref 8.4–10.5)
Potassium: 3.8 mEq/L (ref 3.5–5.1)
Sodium: 128 mEq/L — ABNORMAL LOW (ref 135–145)
Total Protein: 7 g/dL (ref 6.0–8.3)

## 2013-06-18 LAB — BODY FLUID CELL COUNT WITH DIFFERENTIAL
Lymphs, Fluid: 72 %
Monocyte-Macrophage-Serous Fluid: 19 % — ABNORMAL LOW (ref 50–90)
Neutrophil Count, Fluid: 9 % (ref 0–25)

## 2013-06-18 LAB — URINALYSIS, ROUTINE W REFLEX MICROSCOPIC
Bilirubin Urine: NEGATIVE
Hgb urine dipstick: NEGATIVE
Ketones, ur: >80 mg/dL — AB
Specific Gravity, Urine: 1.034 — ABNORMAL HIGH (ref 1.005–1.030)
Urobilinogen, UA: 1 mg/dL (ref 0.0–1.0)
pH: 6 (ref 5.0–8.0)

## 2013-06-18 LAB — PRO B NATRIURETIC PEPTIDE: Pro B Natriuretic peptide (BNP): 340.1 pg/mL (ref 0–450)

## 2013-06-18 LAB — MRSA PCR SCREENING: MRSA by PCR: NEGATIVE

## 2013-06-18 MED ORDER — IOHEXOL 300 MG/ML  SOLN
100.0000 mL | Freq: Once | INTRAMUSCULAR | Status: AC | PRN
  Administered 2013-06-18: 100 mL via INTRAVENOUS

## 2013-06-18 MED ORDER — IOHEXOL 300 MG/ML  SOLN
50.0000 mL | Freq: Once | INTRAMUSCULAR | Status: AC | PRN
  Administered 2013-06-18: 50 mL via ORAL

## 2013-06-18 MED ORDER — MORPHINE SULFATE 4 MG/ML IJ SOLN
4.0000 mg | Freq: Once | INTRAMUSCULAR | Status: DC
  Filled 2013-06-18: qty 1

## 2013-06-18 MED ORDER — SENNA 8.6 MG PO TABS
1.0000 | ORAL_TABLET | Freq: Two times a day (BID) | ORAL | Status: DC
  Administered 2013-06-18 – 2013-06-26 (×16): 8.6 mg via ORAL
  Filled 2013-06-18 (×15): qty 1

## 2013-06-18 MED ORDER — OXYCODONE HCL 5 MG PO TABS
5.0000 mg | ORAL_TABLET | ORAL | Status: DC | PRN
  Administered 2013-06-18 – 2013-06-20 (×3): 5 mg via ORAL
  Filled 2013-06-18 (×3): qty 1

## 2013-06-18 MED ORDER — ASPIRIN EC 81 MG PO TBEC
81.0000 mg | DELAYED_RELEASE_TABLET | Freq: Every day | ORAL | Status: DC
  Administered 2013-06-18 – 2013-06-26 (×9): 81 mg via ORAL
  Filled 2013-06-18 (×9): qty 1

## 2013-06-18 MED ORDER — LORAZEPAM 0.5 MG PO TABS
0.5000 mg | ORAL_TABLET | Freq: Three times a day (TID) | ORAL | Status: DC
  Administered 2013-06-18 – 2013-06-26 (×21): 0.5 mg via ORAL
  Filled 2013-06-18 (×22): qty 1

## 2013-06-18 MED ORDER — OXYCODONE-ACETAMINOPHEN 5-325 MG PO TABS
1.0000 | ORAL_TABLET | Freq: Four times a day (QID) | ORAL | Status: DC | PRN
  Administered 2013-06-20: 1 via ORAL
  Filled 2013-06-18: qty 1

## 2013-06-18 MED ORDER — SODIUM CHLORIDE 0.9 % IV BOLUS (SEPSIS)
1000.0000 mL | Freq: Once | INTRAVENOUS | Status: AC
  Administered 2013-06-18: 1000 mL via INTRAVENOUS

## 2013-06-18 MED ORDER — MAGNESIUM HYDROXIDE 400 MG/5ML PO SUSP
30.0000 mL | Freq: Every evening | ORAL | Status: DC | PRN
  Administered 2013-06-20: 30 mL via ORAL
  Filled 2013-06-18: qty 30

## 2013-06-18 MED ORDER — POLYETHYLENE GLYCOL 3350 17 G PO PACK
17.0000 g | PACK | ORAL | Status: DC
  Administered 2013-06-18: 17 g via ORAL
  Filled 2013-06-18 (×2): qty 1

## 2013-06-18 MED ORDER — HEPARIN SODIUM (PORCINE) 5000 UNIT/ML IJ SOLN
5000.0000 [IU] | Freq: Three times a day (TID) | INTRAMUSCULAR | Status: DC
  Administered 2013-06-18 – 2013-06-26 (×24): 5000 [IU] via SUBCUTANEOUS
  Filled 2013-06-18 (×27): qty 1

## 2013-06-18 MED ORDER — LEVOTHYROXINE SODIUM 25 MCG PO TABS
25.0000 ug | ORAL_TABLET | Freq: Every day | ORAL | Status: DC
  Administered 2013-06-19 – 2013-06-26 (×8): 25 ug via ORAL
  Filled 2013-06-18 (×10): qty 1

## 2013-06-18 MED ORDER — SODIUM CHLORIDE 0.9 % IV SOLN: Freq: Once | INTRAVENOUS | Status: DC

## 2013-06-18 MED ORDER — ONDANSETRON HCL 4 MG/2ML IJ SOLN: 4.0000 mg | Freq: Three times a day (TID) | INTRAMUSCULAR | Status: DC | PRN

## 2013-06-18 MED ORDER — MILK AND MOLASSES ENEMA
1.0000 | Freq: Once | RECTAL | Status: AC
  Administered 2013-06-18: 250 mL via RECTAL
  Filled 2013-06-18: qty 250

## 2013-06-18 MED ORDER — DOCUSATE SODIUM 100 MG PO CAPS
100.0000 mg | ORAL_CAPSULE | Freq: Two times a day (BID) | ORAL | Status: DC
  Administered 2013-06-18 – 2013-06-26 (×16): 100 mg via ORAL
  Filled 2013-06-18 (×17): qty 1

## 2013-06-18 MED ORDER — BISACODYL 10 MG RE SUPP: 10.0000 mg | Freq: Every day | RECTAL | Status: DC | PRN

## 2013-06-18 MED ORDER — ALUM & MAG HYDROXIDE-SIMETH 200-200-20 MG/5ML PO SUSP: 30.0000 mL | Freq: Four times a day (QID) | ORAL | Status: DC | PRN

## 2013-06-18 MED ORDER — ONDANSETRON HCL 4 MG/2ML IJ SOLN
4.0000 mg | Freq: Four times a day (QID) | INTRAMUSCULAR | Status: DC | PRN
  Administered 2013-06-20: 4 mg via INTRAVENOUS
  Filled 2013-06-18: qty 2

## 2013-06-18 MED ORDER — OXYCODONE-ACETAMINOPHEN 5-325 MG PO TABS: 1.0000 | ORAL_TABLET | Freq: Four times a day (QID) | ORAL | Status: DC | PRN

## 2013-06-18 MED ORDER — ONDANSETRON HCL 4 MG/2ML IJ SOLN
4.0000 mg | Freq: Once | INTRAMUSCULAR | Status: AC
  Administered 2013-06-18: 4 mg via INTRAVENOUS
  Filled 2013-06-18: qty 2

## 2013-06-18 MED ORDER — SODIUM CHLORIDE 0.9 % IV SOLN
INTRAVENOUS | Status: DC
  Administered 2013-06-18 – 2013-06-19 (×3): via INTRAVENOUS
  Administered 2013-06-21: 75 mL/h via INTRAVENOUS
  Administered 2013-06-21: 01:00:00 via INTRAVENOUS
  Administered 2013-06-22: 75 mL/h via INTRAVENOUS
  Administered 2013-06-23 – 2013-06-26 (×6): via INTRAVENOUS

## 2013-06-18 NOTE — Care Management Note (Signed)
   CARE MANAGEMENT NOTE 06/18/2013  Patient:  [name], [name]   Account Number:  [id number]  Date Initiated:  06/18/2013  Documentation initiated by:  Brand Siever  Subjective/Objective Assessment:   79 yo female admitted with abdominal pain.Hx of cognitive impairment, recent hip fracture.     Action/Plan:   SNF   Anticipated DC Date:     Anticipated DC Plan:  SKILLED NURSING FACILITY  In-house referral  Clinical Social Worker      DC Associate Professor  CM consult      Choice offered to / List presented to:  NA   DME arranged  NA      DME agency  NA     HH arranged  NA      HH agency  NA   Status of service:  Completed, signed off Medicare Important Message given?   (If response is "NO", the following Medicare IM given date fields will be blank) Date Medicare IM given:   Date Additional Medicare IM given:    Discharge Disposition:    Per UR Regulation:  Reviewed for med. necessity/level of care/duration of stay  If discussed at Long Length of Stay Meetings, dates discussed:    Comments:  06/18/13 1329 Cadence Haslam,MSN,RN [phone] Chart reviewed for utilization of services. Pt a current SNF resident. CSW to follow.

## 2013-06-18 NOTE — ED Provider Notes (Signed)
CSN: [id number]     Arrival date & time 06/18/13  0500 History   First MD Initiated Contact with Patient 06/18/13 [phone]     Chief Complaint  Patient presents with  . Emesis  . Leg Swelling   (Consider location/radiation/quality/duration/timing/severity/associated sxs/prior Treatment) HPI 79 year old female presents to the emergency department from her nursing facility via EMS with complaint of "not feeling good.".  Patient reports she has not felt well for 3 days.  She reports nausea and vomiting for 3 days.  She's had no appetite.  She is unsure if she's had fever.  Patient is also complaining of cold and pain to bilateral legs, left, slightly worse than right.  She complains of diffuse abdominal pain.  Patient reports that she was seen by the hospital doctor 3 days ago, who told her she had a fever and her legs were swollen.  Patient reports she saw this doctor at the hospital.  No prior notes from 3 days ago to confirm her story.  Patient has history of schizophrenia, dementia. Past Medical History  Diagnosis Date  . Hypertension   . Dysrhythmia   . Fall 06/01/11    twice this year  . Incontinence of urine     wears depends  . 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 Psychoses 05/2011    Hallucinations and AMS in setting of low Na, UTI, and steroid use   . Stroke   . Seizures   . Rheumatoid arthritis(714.0)     With known cervical subluxation  . Encephalopathy, unspecified   . Anemia   . Generalized anxiety disorder   . Unspecified vitamin D deficiency   . Edema   . Unspecified hypothyroidism   . Unspecified constipation   . Insomnia, unspecified   . Dementia, unspecified, with behavioral disturbance   . Anxiety state, unspecified   . Coronary atherosclerosis of native coronary artery   . Unspecified late effects of cerebrovascular disease   . Osteoporosis, unspecified    Past Surgical History  Procedure Laterality Date  . Toe surgery      great toe bilateral  feet due to arthrits  . Eus N/A 01/09/2013    Procedure: UPPER ENDOSCOPIC ULTRASOUND (EUS) LINEAR;  Surgeon: [name], MD;  Location: [facility];  Service: Endoscopy;  Laterality: N/A;  . Fine needle aspiration N/A 01/09/2013    Procedure: FINE NEEDLE ASPIRATION (FNA) LINEAR;  Surgeon: [name], MD;  Location: [facility];  Service: Endoscopy;  Laterality: N/A;  . Eye surgery  2008    cataract-Dr. [name]  . Femur im nail Right 03/18/2013    Procedure: INTRAMEDULLARY (IM) NAIL FEMORAL;  Surgeon: [name], MD;  Location: [facility] OR;  Service: Orthopedics;  Laterality: Right;   Family History  Problem Relation Age of Onset  . Congestive Heart Failure Mother    History  Substance Use Topics  . Smoking status: Never Smoker   . Smokeless tobacco: Never Used  . Alcohol Use: No   OB History   Grav Para Term Preterm Abortions TAB SAB Ect Mult Living                 Review of Systems  Unable to perform ROS: Dementia    Allergies  Hydrochlorothiazide; Leflunomide; Propoxyphene and methadone; Remicade; Zinc; Colchicine; Famotidine; and Penicillins  Home Medications   Current Outpatient Rx  Name  Route  Sig  Dispense  Refill  . acetaminophen (TYLENOL) 500 MG tablet  Oral   Take 500 mg by mouth every 6 (six) hours as needed for pain.         Marland Kitchen alum & mag hydroxide-simeth (MAALOX/MYLANTA) 200-200-20 MG/5ML suspension   Oral   Take 30 mLs by mouth every 6 (six) hours as needed for indigestion.         Marland Kitchen aspirin EC 81 MG tablet   Oral   Take 1 tablet (81 mg total) by mouth daily.   30 tablet   0   . Cholecalciferol (VITAMIN D3) 5000 UNITS CAPS   Oral   Take 1 capsule by mouth daily.         Marland Kitchen docusate sodium 100 MG CAPS   Oral   Take 100 mg by mouth 2 (two) times daily.   10 capsule   0   . gabapentin (NEURONTIN) 300 MG capsule   Oral   Take 300 mg by mouth 3 (three) times daily.         Marland Kitchen HYDROcodone-acetaminophen (NORCO/VICODIN) 5-325 MG per  tablet   Oral   Take 1-2 tablets by mouth every 6 (six) hours as needed.   20 tablet   0   . lactulose (CHRONULAC) 10 GM/15ML solution   Oral   Take 30 mLs (20 g total) by mouth 2 (two) times daily as needed (Constipation).   240 mL   0   . levothyroxine (SYNTHROID, LEVOTHROID) 25 MCG tablet   Oral   Take 25 mcg by mouth daily before breakfast.         . lidocaine (LIDODERM) 5 %   Transdermal   Place 1 patch onto the skin daily. Remove & Discard patch within 12 hours or as directed by MD         . LORazepam (ATIVAN) 0.5 MG tablet   Oral   Take 0.5 mg by mouth every 8 (eight) hours.         . magnesium hydroxide (MILK OF MAGNESIA) 400 MG/5ML suspension   Oral   Take 30 mLs by mouth at bedtime as needed for constipation.         Marland Kitchen OLANZapine (ZYPREXA) 10 MG tablet   Oral   Take 10 mg by mouth at bedtime.         . polyethylene glycol (MIRALAX / GLYCOLAX) packet   Oral   Take 17 g by mouth every 3 (three) days.         . vitamin C (ASCORBIC ACID) 500 MG tablet   Oral   Take 500 mg by mouth 2 (two) times daily.         Marland Kitchen zinc sulfate 220 MG capsule   Oral   Take 220 mg by mouth daily.          BP 111/64  Pulse 85  Temp(Src) 99.1 F (37.3 C) (Oral)  Resp 18  SpO2 95% Physical Exam  Constitutional: She appears well-developed and well-nourished. No distress.  Frail, elderly, female, mild distress, secondary to pain  HENT:  Head: Normocephalic and atraumatic.  Nose: Nose normal.  Dry mucous membranes  Eyes: Conjunctivae and EOM are normal. Pupils are equal, round, and reactive to light.  Neck: Normal range of motion. Neck supple. No JVD present. No tracheal deviation present. No thyromegaly present.  Cardiovascular: Normal rate, regular rhythm, normal heart sounds and intact distal pulses.  Exam reveals no gallop and no friction rub.   No murmur heard. Pulmonary/Chest: Effort normal and breath sounds normal. No stridor. No respiratory  distress.  She has no wheezes. She has no rales. She exhibits no tenderness.  Abdominal: Soft. Bowel sounds are normal. She exhibits no distension and no mass. Tenderness: diffuse tenderness to palpation, worse in the periumbilical area. There is no rebound and no guarding.  Musculoskeletal: Normal range of motion. She exhibits edema. She exhibits no tenderness.  Patient has 1+ pitting edema of bilateral legs, right greater than left.  Palpation of any part of her legs from toes to mid thigh.  Causes pain.  Pulses are intact.  Patient has decreased range of motion do to pain  Lymphadenopathy:    She has no cervical adenopathy.  Neurological: She is alert. She exhibits normal muscle tone. Coordination normal.  Skin: Skin is warm and dry. No rash noted. No erythema. No pallor.  Psychiatric: She has a normal mood and affect. Her behavior is normal. Judgment and thought content normal.    ED Course  Procedures (including critical care time) Labs Review Labs Reviewed  CBC WITH DIFFERENTIAL - Abnormal; Notable for the following:    HCT 35.2 (*)    Platelets 441 (*)    Neutrophils Relative % 83 (*)    Lymphocytes Relative 9 (*)    All other components within normal limits  COMPREHENSIVE METABOLIC PANEL - Abnormal; Notable for the following:    Sodium 128 (*)    Chloride 90 (*)    Glucose, Bld 109 (*)    Creatinine, Ser 0.43 (*)    Albumin 2.5 (*)    All other components within normal limits  LIPASE, BLOOD - Abnormal; Notable for the following:    Lipase 9 (*)    All other components within normal limits  URINALYSIS, ROUTINE W REFLEX MICROSCOPIC - Abnormal; Notable for the following:    Color, Urine AMBER (*)    Specific Gravity, Urine 1.034 (*)    Ketones, ur >80 (*)    Protein, ur 30 (*)    All other components within normal limits  URINE MICROSCOPIC-ADD ON - Abnormal; Notable for the following:    Bacteria, UA FEW (*)    Casts HYALINE CASTS (*)    Crystals CA OXALATE CRYSTALS (*)    All  other components within normal limits  PRO B NATRIURETIC PEPTIDE   Imaging Review Ct Abdomen Pelvis W Contrast  06/18/2013   CLINICAL DATA:  Nausea and vomiting.  EXAM: CT ABDOMEN AND PELVIS WITH CONTRAST  TECHNIQUE: Multidetector CT imaging of the abdomen and pelvis was performed using the standard protocol following bolus administration of intravenous contrast.  CONTRAST:  100 mL of Omnipaque 300 IV contrast  COMPARISON:  CT of the abdomen and pelvis performed 01/05/2013  FINDINGS: The visualized lung bases are clear. Scattered coronary artery calcifications are seen.  There is new moderate to large volume ascites throughout the abdomen and pelvis. This surrounds small and large bowel loops, and the liver and spleen, with mass effect on the bladder.  There is diffuse infiltration of the omentum at the left upper quadrant and left mid abdomen, with irregular nodularity, most compatible with omental caking. In addition, there is heterogeneous soft tissue thickening with regard to the inferior aspect of the ascites within the pelvis, raising concern for dependent metastases. The primary source of the patient's apparent malignancy is not definitely identified on this study.  The common hepatic duct measures 0.8 cm in maximal diameter, within normal limits given the patient's age. There is mild nonspecific prominence of the intrahepatic biliary ducts. The gallbladder is  distended. No definite gallstones are seen on CT. A 2.0 cm cystic lesion within the body of the pancreas is grossly unchanged from the prior study. The pancreas is otherwise unremarkable. The adrenal glands are within normal limits.  A few tiny bilateral renal cysts are seen; the kidneys are otherwise unremarkable in appearance. There is no evidence of hydronephrosis. No renal or ureteral stones are identified. No perinephric stranding is appreciated.  No free fluid is identified. The small bowel is unremarkable in appearance. The stomach is  within normal limits. No acute vascular abnormalities are seen. Scattered calcification is noted along the abdominal aorta and its branches.  The appendix is not definitely characterized; there is no evidence for appendicitis. The colon is largely filled with stool, raising concern for mild constipation.  The bladder is mildly distended and grossly unremarkable in appearance, though partially compressed by the patient's ascites. The uterus demonstrates new heterogeneity and multiple prominent areas of cystic change, raising question for endometrial carcinoma given the abdominal findings. The ovaries are not well assessed. No inguinal lymphadenopathy is seen.  No acute osseous abnormalities are identified. Right hip hardware is incompletely imaged but appears grossly unremarkable. There is chronic compression deformity involving the superior endplate of vertebral body T11.  IMPRESSION: 1. New moderate to large volume ascites throughout the abdomen and pelvis. 2. Diffuse infiltration of the omentum at the left upper quadrant and left mid abdomen, with associated nodularity, suspicious for omental caking. Heterogeneous soft tissue thickening with regard to the inferior aspect of the ascites within the pelvis, raising concern for dependent metastases. 3. The primary source of the malignancy is not definitely seen, though there is new heterogeneity and multiple prominent areas of cystic change within the uterus, which could reflect endometrial carcinoma. Diagnostic paracentesis could be considered for further evaluation, as deemed clinically appropriate. 4. Mild nonspecific prominence of the intrahepatic biliary ducts, with gallbladder distention. No definite stones identified on CT. 5. Relatively stable 2.0 cm cystic lesion within the body of the pancreas. 6. Few tiny bilateral renal cysts seen. 7. Scattered calcification along the abdominal aorta and its branches. 8. Scattered coronary artery calcifications seen. 9.  Colon largely filled with stool, raising concern for mild constipation.   Electronically Signed   By: Roanna Raider M.D.   On: 06/18/2013 07:16    EKG Interpretation   None       MDM   1. Nausea and vomiting   2. Hyponatremia   3. Ascites   4. Abdominal malignancy   5. Peripheral edema   6. Unspecified constipation    79 year old female with complaint of abdominal pain, nausea and vomiting.  Patient also complaining of cold lower extremities, and diffuse pain.  She is noted to have edema, unclear chronicity of her edema.  Plan for labs, CT abdomen pelvis.  7:22 AM Pt noted to be hyponatremic, dehydrated on labs.  CT scan shows new ascites with concern for malignancy, constipation.  Pt had pancreatic mass biopsied in July, unknown results.  Possible uterine source on CT.  Will d/w hospitalist for admission.    Olivia Mackie, MD 06/18/13 [phone]

## 2013-06-18 NOTE — Consult Note (Signed)
She was seen and examined, x-rays were reviewed.  Patient presents with abdominal pain and weakness.  CT demonstrates significant ascites, a stable appearing pancreatic cyst, infiltration of the omentum consistent with metastatic disease, and prominence of the intrahepatic ducts.  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  I think it is unlikely that this is from the pancreatic cyst.  Primary GYN pathology, especially in view of the CT findings demonstrating heterogeneity of the uterus with cystic change, is most likely.  Recommendations #1 diagnostic paracentesis #2 check CEA, CA 19-9 and CA 125 levels #3 dedicated pelvic ultrasound of the uterus versus  vaginal ultrasound

## 2013-06-18 NOTE — Procedures (Signed)
US guided diagnostic/therapeutic paracentesis performed yielding 1.6 liters slightly turbid, yellow fluid. A portion of the fluid was sent to the lab for preordered studies. No immediate complications.

## 2013-06-18 NOTE — H&P (Signed)
[facility] History and Physical  [name] ZOX:[id number] DOB: May 17, 1930 DOA: 06/18/2013  Referring physician:  PCP: No primary provider on file.   Chief Complaint: Abdominal pain/nausea vomiting  HPI: [name] is a 79 y.o. female with a past medical history of cognitive impairment, recent hip fracture in September of 2014, nursing home resident, who was transferred to the emergency department today with complaints of nausea vomiting and abdominal pain. It appears that patient was admitted in July 2014 for urinary tract infection. At that time she had a CT scan of abdomen and pelvis performed on 01/05/2013 that showed a 2.6 x 1.9 cm cystic neoplasm or pseudocyst. This was further worked up with an MRCP which showed a well-circumscribed cystic lesion within the pancreatic tail. During that hospitalization [name] of gastroenterology was consulted as he did an endoscopic ultrasound on 01/05/2013. Patient was given a referral to General Surgery, as it appears that she was not found to be a surgical candidate. She presents today with a three-day history of abdominal distention associated with generalized abdominal pain, nausea, vomiting, unable to hold by mouth down. She also complains of generalized weakness, fatigue, overall feeling poorly. She denies hematemesis, bloody stools, melena. She endorses constipation, not having a bowel movement for the last 3 days at least. In the emergency department today she had a repeat CT scan of abdomen and pelvis which now shows a new moderate large volume ascites with diffuse infiltration of the omentum that could be consistent with metastatic disease. There was mild nonspecific prominence of the intrahepatic biliary ducts. Also noted on CT scan with the presence of colonic stool. I discussed case with Dr. Truett Perna of medical Oncology and Doug Sou PA of gastroenterology for consultation.                                                                                                                                                                             Review of Systems:  Constitutional:  No weight loss, night sweats.  HEENT:  No headaches, Difficulty swallowing,Tooth/dental problems,Sore throat,  No sneezing, itching, ear ache, nasal congestion, post nasal drip,  Cardio-vascular:  No chest pain, Orthopnea, PND, swelling in lower extremities, anasarca, dizziness, palpitations  GI:  No heartburn, indigestion, diarrhea, change in bowel habits  Resp:  No shortness of breath with exertion or at rest. No excess mucus, no productive cough, No non-productive cough, No coughing up of blood.No change in color of mucus.No wheezing.No chest wall deformity  Skin:  no rash or lesions.  GU:  no dysuria, change in color of urine, no urgency or frequency. No flank pain.  Musculoskeletal:  No joint pain or swelling. No decreased range of motion.  No back pain.  Psych:  No change in mood or affect. No depression or anxiety   Past Medical History  Diagnosis Date  . Hypertension   . Dysrhythmia   . Fall 06/01/11    twice this year  . Incontinence of urine     wears depends  . 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 Psychoses 05/2011    Hallucinations and AMS in setting of low Na, UTI, and steroid use   . Stroke   . Seizures   . Rheumatoid arthritis(714.0)     With known cervical subluxation  . Encephalopathy, unspecified   . Anemia   . Generalized anxiety disorder   . Unspecified vitamin D deficiency   . Edema   . Unspecified hypothyroidism   . Unspecified constipation   . Insomnia, unspecified   . Dementia, unspecified, with behavioral disturbance   . Anxiety state, unspecified   . Coronary atherosclerosis of native coronary artery   . Unspecified late effects of cerebrovascular disease   . Osteoporosis, unspecified    Past Surgical History  Procedure Laterality Date  . Toe surgery      great toe  bilateral feet due to arthrits  . Eus N/A 01/09/2013    Procedure: UPPER ENDOSCOPIC ULTRASOUND (EUS) LINEAR;  Surgeon: [name], MD;  Location: [facility];  Service: Endoscopy;  Laterality: N/A;  . Fine needle aspiration N/A 01/09/2013    Procedure: FINE NEEDLE ASPIRATION (FNA) LINEAR;  Surgeon: [name], MD;  Location: [facility];  Service: Endoscopy;  Laterality: N/A;  . Eye surgery  2008    cataract-Dr. [name]  . Femur im nail Right 03/18/2013    Procedure: INTRAMEDULLARY (IM) NAIL FEMORAL;  Surgeon: [name], MD;  Location: [facility] OR;  Service: Orthopedics;  Laterality: Right;   Social History:  reports that she has never smoked. She has never used smokeless tobacco. She reports that she does not drink alcohol or use illicit drugs.  Allergies  Allergen Reactions  . Hydrochlorothiazide   . Leflunomide   . Propoxyphene And Methadone   . Remicade [Infliximab]   . Zinc   . Colchicine Anxiety  . Famotidine Anxiety  . Penicillins Anxiety    Family History  Problem Relation Age of Onset  . Congestive Heart Failure Mother      Prior to Admission medications   Medication Sig Start Date End Date Taking? Authorizing Provider  acetaminophen (TYLENOL) 500 MG tablet Take 500 mg by mouth every 6 (six) hours as needed for pain.   Yes Historical Provider, MD  alum & mag hydroxide-simeth (MAALOX/MYLANTA) 200-200-20 MG/5ML suspension Take 30 mLs by mouth every 6 (six) hours as needed for indigestion.   Yes Historical Provider, MD  aspirin EC 81 MG tablet Take 1 tablet (81 mg total) by mouth daily. 03/22/13  Yes Jeralyn Bennett, MD  Cholecalciferol (VITAMIN D3) 5000 UNITS CAPS Take 1 capsule by mouth daily.   Yes Historical Provider, MD  docusate sodium 100 MG CAPS Take 100 mg by mouth 2 (two) times daily. 03/22/13  Yes Jeralyn Bennett, MD  HYDROcodone-acetaminophen (NORCO/VICODIN) 5-325 MG per tablet Take 1-2 tablets by mouth every 6 (six) hours as needed. 03/22/13  Yes Jeralyn Bennett, MD  lactulose (CHRONULAC) 10 GM/15ML solution Take 30 mLs (20 g total) by mouth 2 (two) times daily as needed (Constipation). 03/22/13  Yes Jeralyn Bennett, MD  levothyroxine (SYNTHROID, LEVOTHROID) 25 MCG tablet Take 25 mcg by mouth daily before breakfast.  Yes Historical Provider, MD  LORazepam (ATIVAN) 0.5 MG tablet Take 0.5 mg by mouth every 8 (eight) hours.   Yes Historical Provider, MD  magnesium hydroxide (MILK OF MAGNESIA) 400 MG/5ML suspension Take 30 mLs by mouth at bedtime as needed for constipation.   Yes Historical Provider, MD  polyethylene glycol (MIRALAX / GLYCOLAX) packet Take 17 g by mouth every 3 (three) days.   Yes Historical Provider, MD  vitamin C (ASCORBIC ACID) 500 MG tablet Take 500 mg by mouth 2 (two) times daily.   Yes Historical Provider, MD  zinc sulfate 220 MG capsule Take 220 mg by mouth daily.   Yes Historical Provider, MD   Physical Exam: Filed Vitals:   06/18/13 0935  BP: 128/76  Pulse: 98  Temp: 98.9 F (37.2 C)  Resp: 18    BP 128/76  Pulse 98  Temp(Src) 98.9 F (37.2 C) (Oral)  Resp 18  Ht 5\' 3"  (1.6 m)  Wt 58.378 kg (128 lb 11.2 oz)  BMI 22.80 kg/m2  SpO2 100%  General:  Appears calm and comfortable Eyes: PERRL, normal lids, irises & conjunctiva ENT: grossly normal hearing, lips & tongue Neck: no LAD, masses or thyromegaly Cardiovascular: RRR, no m/r/g. No LE edema. Telemetry: SR, no arrhythmias  Respiratory: CTA bilaterally, no w/r/r. Normal respiratory effort. Abdomen: Mildly distended, there is generalized pain to palpation, no palpable masses noted. No peritoneal signs.  Skin: no rash or induration seen on limited exam Musculoskeletal: grossly normal tone BUE/BLE Psychiatric: grossly normal mood and affect, speech fluent and appropriate Neurologic: grossly non-focal.          Labs on Admission:  Basic Metabolic Panel:  Recent Labs Lab 06/18/13 0520  NA 128*  K 3.8  CL 90*  CO2 25  GLUCOSE 109*  BUN 14   CREATININE 0.43*  CALCIUM 9.0   Liver Function Tests:  Recent Labs Lab 06/18/13 0520  AST 13  ALT <5  ALKPHOS 70  BILITOT 0.4  PROT 7.0  ALBUMIN 2.5*    Recent Labs Lab 06/18/13 0520  LIPASE 9*   No results found for this basename: AMMONIA,  in the last 168 hours CBC:  Recent Labs Lab 06/18/13 0520  WBC 7.9  NEUTROABS 6.5  HGB 12.1  HCT 35.2*  MCV 84.4  PLT 441*   Cardiac Enzymes: No results found for this basename: CKTOTAL, CKMB, CKMBINDEX, TROPONINI,  in the last 168 hours  BNP (last 3 results)  Recent Labs  06/18/13 0520  PROBNP 340.1   CBG: No results found for this basename: GLUCAP,  in the last 168 hours  Radiological Exams on Admission: Ct Abdomen Pelvis W Contrast  06/18/2013   CLINICAL DATA:  Nausea and vomiting.  EXAM: CT ABDOMEN AND PELVIS WITH CONTRAST  TECHNIQUE: Multidetector CT imaging of the abdomen and pelvis was performed using the standard protocol following bolus administration of intravenous contrast.  CONTRAST:  100 mL of Omnipaque 300 IV contrast  COMPARISON:  CT of the abdomen and pelvis performed 01/05/2013  FINDINGS: The visualized lung bases are clear. Scattered coronary artery calcifications are seen.  There is new moderate to large volume ascites throughout the abdomen and pelvis. This surrounds small and large bowel loops, and the liver and spleen, with mass effect on the bladder.  There is diffuse infiltration of the omentum at the left upper quadrant and left mid abdomen, with irregular nodularity, most compatible with omental caking. In addition, there is heterogeneous soft tissue thickening with regard to the  inferior aspect of the ascites within the pelvis, raising concern for dependent metastases. The primary source of the patient's apparent malignancy is not definitely identified on this study.  The common hepatic duct measures 0.8 cm in maximal diameter, within normal limits given the patient's age. There is mild nonspecific  prominence of the intrahepatic biliary ducts. The gallbladder is distended. No definite gallstones are seen on CT. A 2.0 cm cystic lesion within the body of the pancreas is grossly unchanged from the prior study. The pancreas is otherwise unremarkable. The adrenal glands are within normal limits.  A few tiny bilateral renal cysts are seen; the kidneys are otherwise unremarkable in appearance. There is no evidence of hydronephrosis. No renal or ureteral stones are identified. No perinephric stranding is appreciated.  No free fluid is identified. The small bowel is unremarkable in appearance. The stomach is within normal limits. No acute vascular abnormalities are seen. Scattered calcification is noted along the abdominal aorta and its branches.  The appendix is not definitely characterized; there is no evidence for appendicitis. The colon is largely filled with stool, raising concern for mild constipation.  The bladder is mildly distended and grossly unremarkable in appearance, though partially compressed by the patient's ascites. The uterus demonstrates new heterogeneity and multiple prominent areas of cystic change, raising question for endometrial carcinoma given the abdominal findings. The ovaries are not well assessed. No inguinal lymphadenopathy is seen.  No acute osseous abnormalities are identified. Right hip hardware is incompletely imaged but appears grossly unremarkable. There is chronic compression deformity involving the superior endplate of vertebral body T11.  IMPRESSION: 1. New moderate to large volume ascites throughout the abdomen and pelvis. 2. Diffuse infiltration of the omentum at the left upper quadrant and left mid abdomen, with associated nodularity, suspicious for omental caking. Heterogeneous soft tissue thickening with regard to the inferior aspect of the ascites within the pelvis, raising concern for dependent metastases. 3. The primary source of the malignancy is not definitely seen,  though there is new heterogeneity and multiple prominent areas of cystic change within the uterus, which could reflect endometrial carcinoma. Diagnostic paracentesis could be considered for further evaluation, as deemed clinically appropriate. 4. Mild nonspecific prominence of the intrahepatic biliary ducts, with gallbladder distention. No definite stones identified on CT. 5. Relatively stable 2.0 cm cystic lesion within the body of the pancreas. 6. Few tiny bilateral renal cysts seen. 7. Scattered calcification along the abdominal aorta and its branches. 8. Scattered coronary artery calcifications seen. 9. Colon largely filled with stool, raising concern for mild constipation.   Electronically Signed   By: Roanna Raider M.D.   On: 06/18/2013 07:16      Assessment/Plan Active Problems:   Abdominal malignancy   Abdominal pain   Nausea and vomiting   Dehydration   Hyponatremia   HYPERTENSION   Constipation   1. Suspected intra-abdominal malignancy. Imaging studies performed in July of 2014 showing pancreatic lesion which was further worked up with MRCP and endoscopic ultrasound. She had a CA 19-9 of 16.7 during that time. She now presents with a new moderately large volume ascites and diffuse infiltration of omentum which could be consistent with metastatic disease. I have consulted gastroenterology and medical oncology.  2. Abdominal pain. Patient complaining of generalized abdominal pain which I think is likely secondary to abdominal distention from ascites as well as constipation. CT scan did show colonic stool. Her lipase level and liver function tests were unremarkable. Will place patient on bowel regimen, provide  milk and molasses enema, Dulcolax suppository, Senokot and MiraLax. Patient may undergo paracentesis will await GI input. 3. Nausea vomiting. Could be secondary to constipation. Other possibilities include infectious gastroenteritis enteritis. Will provide supportive care, empiric IV  antiemetic therapy, IV fluid resuscitation. 4. Probable dehydration. Patient having minimal by mouth intake the last several days secondary to nausea vomiting and unable to tolerate by mouth. Providing gentle IV fluid hydration with normal saline at 75 mL per hour. 5. Hyponatremia. Likely secondary to hypotonic hypovolemic hyponatremia in setting of GI loss from multiple episodes of nausea and vomiting. Provide IV fluid resuscitation with normal saline at 75 mL an hour, repeat lab work in a.m. 6. Nutrition. Place patient on clears, advance her diet as tolerated 7. DVT prophylaxis. Heparin subcutaneous    Code Status: Full Code Disposition Plan: Will admit to the inpatient service, I anticipate she will require greater than 2 nights stay.   Time spent: 70 minutes  Jeralyn Bennett [facility] Pager [phone]

## 2013-06-18 NOTE — ED Notes (Signed)
Per EMS pt is a resident at [facility] they were called out because pt has not been feeling well for the past 3 days and started having vomiting this evening unable to keep anything down  Pt asked to be brought to the hospital for evaluation

## 2013-06-18 NOTE — ED Notes (Signed)
Patient transported to CT 

## 2013-06-18 NOTE — Consult Note (Signed)
[facility]  [phone]   ONCOLOGY  HOSPITAL CONSULTATION NOTE  [name]                                MR#: [id number]  DOB: 1930/02/26                       CSN#: [id number]  Referring MD: [facility]  Primary MD: Dr.  Jaquita Rector for Consult: Pancreatic Cancer   QIO:[id number] [name] is a 79 y.o. female with multiple medical issues including Rheumatoid arthritis, psychotic disorder Axis1 and seizure disorder,  and recent ORIF on 03/19/13, nursing home resident, who was transferred to the emergency department today with complaints of nausea and  vomiting with  abdominal pain. The patient was admitted in July 2014 for urinary tract infection. At that time she had a CT scan of abdomen and pelvis performed on 01/05/2013 that showed a 2.6 x 1.9 cm cystic neoplasm or pseudocyst. This was further worked up with an MRCP on 01/08/13 which showed a well-circumscribed cystic lesion within the pancreatic tail. During that hospitalization [name] of gastroenterology was consulted as he did an endoscopic ultrasound on 01/09/2013.  FNA was consistent with atypical cells suspicious for mucinous neoplasm, CA 19.9 was 16.7 on 01/06/13.   She was admitted on 06/18/2013 for with a three-day history of abdominal distention associated with generalized abdominal pain, nausea, vomiting, generalized weakness, fatigue. CT scan of abdomen and pelvis on 06/18/2013 shows a new moderate large volume ascites with diffuse infiltration of the omentum  consistent with metastatic disease. There was mild nonspecific prominence of the intrahepatic biliary ducts. A consultation was requested with recommendations from the oncological standpoint .         PMH:  Past Medical History  Diagnosis Date  . Hypertension   . Dysrhythmia   . Fall 06/01/11    twice this year  . Incontinence of urine     wears depends  . 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 Psychosis 05/2011      .  Stroke   . Seizures   . Rheumatoid arthritis(714.0)     With known cervical subluxation  . Encephalopathy, unspecified   . Anemia   . Generalized anxiety disorder   . Unspecified vitamin D deficiency   .    Marland Kitchen Unspecified hypothyroidism   . Unspecified constipation   . Insomnia, unspecified   .    .    . Coronary atherosclerosis of native coronary artery       . Osteoporosis, unspecified     Surgeries:  Past Surgical History  Procedure Laterality Date  . Toe surgery      great toe bilateral feet due to arthrits  . Eus N/A 01/09/2013    Procedure: UPPER ENDOSCOPIC ULTRASOUND (EUS) LINEAR;  Surgeon: [name], MD;  Location: [facility] ENDOSCOPY;  Service: Endoscopy;  Laterality: N/A;  . Fine needle aspiration N/A 01/09/2013    Procedure: FINE NEEDLE ASPIRATION (FNA) LINEAR;  Surgeon: [name], MD;  Location: [facility] ENDOSCOPY;  Service: Endoscopy;  Laterality: N/A;  . Eye surgery  2008    cataract-Dr. [name]  . Femur im nail Right 03/18/2013    Procedure: INTRAMEDULLARY (IM) NAIL FEMORAL;  Surgeon: [name], MD;  Location: [facility] OR;  Service: Orthopedics;  Laterality: Right;    Allergies:  Allergies  Allergen Reactions  . Hydrochlorothiazide   . Leflunomide   . Propoxyphene And Methadone   . Remicade [Infliximab]   . Zinc   . Colchicine Anxiety  . Famotidine Anxiety  . Penicillins Anxiety    Medications:  . aspirin EC  81 mg Oral Daily  . docusate sodium  100 mg Oral BID  . heparin  5,000 Units Subcutaneous Q8H  . [START ON 06/19/2013] levothyroxine  25 mcg Oral QAC breakfast  . LORazepam  0.5 mg Oral Q8H  . milk and molasses  1 enema Rectal Once  .  morphine injection  4 mg Intravenous Once  . polyethylene glycol  17 g Oral Q3 days  . senna  1 tablet Oral BID   WUJ:WJXB & mag hydroxide-simeth, bisacodyl, magnesium hydroxide, ondansetron (ZOFRAN) IV, oxyCODONE, oxyCODONE-acetaminophen  ROS Constitutional: Positive for weight loss from 153-128 over the last  few months. Decreased appetite.  Negative for fever, chills or  night sweats. Negative Positive for  fatigue.  Eyes: Negative for blurred vision and double vision.  Respiratory: Negative for cough. No hemoptysis. No shortness of breath. No pleuritic chest pain.  Cardiovascular: Negative for chest pain. No palpitations.  GI: Intermittent nausea,  She states that she has been vomiting, but no emesis has been seen  since presentation,in addition, she refuses to take anti-emetics and pain medications to relieve discomfort. No diarrhea .positive constipation. No change in bowel caliber or color. No  Melena or hematochezia.  Positive for  abdominal discomfort at the epigastrium. Denies history of pancreatitis.  JY:NWGNFA UTI.some incontinence.No urinary retention. Skin: Negative for itching. No rash. No petechia. No easy  Bruising. No jaundice Musculoskeletal: Negative for back pain. Neurological: No headaches. No motor or sensory deficits. Psych: positive for anxiety, and psychosis history.she does not show any memory deficiencies at this time  Family History:    Her father has died from heart disease. Her mother died with kidney failure.   Social History: She has lived much of her life in [place]. She had three children with her first husband. She is widowed x2.She retired in 1997. She volunteered at [employer] for several years before having to stop in 2002. She never smoked and does not drink alcohol. Full code She has three children. Two sons and a daughter who lives in [place]. She lives in a nursing facility.    Physical Exam    Filed Vitals:   06/18/13 0935  BP: 128/76  Pulse: 98  Temp: 98.9 F (37.2 C)  Resp: 18     Filed Weights   06/18/13 0935  Weight: 128 lb 11.2 oz (58.378 kg)   General:  73 -year-old AA  in no acute distress A. and O. x3  Well-developed.Flat affect HEENT: Normocephalic, atraumatic, PERRLA.Sclerae anicteric. Oral cavity without thrush or  lesions.edentulous Neck supple. no thyromegaly, no mass Lungs clear anteriorly . No wheezing, rhonchi or rales. Breasts:no masses Cardiac regular rate and rhythm, no murmur, rubs or gallops Abdomen distended secondary to ascites.  Slightly tender at mid epigastrium towards LUQ.  Bowel sounds x4. No HSM. No masses palpable. No hepatosplenomegaly GU/rectal: deferred. Extremities no clubbing cyanosis , trace edema on both pedal areas. No bruising or petechial rash. No jaundice. Musculoskeletal: no spinal tenderness. Some deformities in her hands consistent with rheumatoid arthritis Neuro: gait not tested. Follows commands, moves all extremities Lymph nodes: No cervical, supraclavicular, axillary, or inguinal nodes   Labs:  CBC   Recent Labs Lab 06/18/13 0520  WBC 7.9  HGB  12.1  HCT 35.2*  PLT 441*  MCV 84.4  MCH 29.0  MCHC 34.4  RDW 14.6  LYMPHSABS 0.7  MONOABS 0.6  EOSABS 0.0  BASOSABS 0.0     CMP    Recent Labs Lab 06/18/13 0520  NA 128*  K 3.8  CL 90*  CO2 25  GLUCOSE 109*  BUN 14  CREATININE 0.43*  CALCIUM 9.0  AST 13  ALT <5  ALKPHOS 70  BILITOT 0.4        Component Value Date/Time   BILITOT 0.4 06/18/2013 0520   BILIDIR 0.3 03/27/2008 2039   IBILI 0.4 03/27/2008 2039     No results found for this basename: INR, PROTIME,  in the last 168 hours  No results found for this basename: DDIMER,  in the last 72 hours   Anemia panel:  No results found for this basename: VITAMINB12, FOLATE, FERRITIN, TIBC, IRON, RETICCTPCT,  in the last 72 hours   Imaging Studies:  Ct Abdomen Pelvis W Contrast  06/18/2013   CLINICAL DATA:  Nausea and vomiting.  EXAM: CT ABDOMEN AND PELVIS WITH CONTRAST  TECHNIQUE: Multidetector CT imaging of the abdomen and pelvis was performed using the standard protocol following bolus administration of intravenous contrast.  CONTRAST:  100 mL of Omnipaque 300 IV contrast  COMPARISON:  CT of the abdomen and pelvis performed  01/05/2013  FINDINGS: The visualized lung bases are clear. Scattered coronary artery calcifications are seen.  There is new moderate to large volume ascites throughout the abdomen and pelvis. This surrounds small and large bowel loops, and the liver and spleen, with mass effect on the bladder.  There is diffuse infiltration of the omentum at the left upper quadrant and left mid abdomen, with irregular nodularity, most compatible with omental caking. In addition, there is heterogeneous soft tissue thickening with regard to the inferior aspect of the ascites within the pelvis, raising concern for dependent metastases. The primary source of the patient'Tina apparent malignancy is not definitely identified on this study.  The common hepatic duct measures 0.8 cm in maximal diameter, within normal limits given the patient'Tina age. There is mild nonspecific prominence of the intrahepatic biliary ducts. The gallbladder is distended. No definite gallstones are seen on CT. A 2.0 cm cystic lesion within the body of the pancreas is grossly unchanged from the prior study. The pancreas is otherwise unremarkable. The adrenal glands are within normal limits.  A few tiny bilateral renal cysts are seen; the kidneys are otherwise unremarkable in appearance. There is no evidence of hydronephrosis. No renal or ureteral stones are identified. No perinephric stranding is appreciated.  No free fluid is identified. The small bowel is unremarkable in appearance. The stomach is within normal limits. No acute vascular abnormalities are seen. Scattered calcification is noted along the abdominal aorta and its branches.  The appendix is not definitely characterized; there is no evidence for appendicitis. The colon is largely filled with stool, raising concern for mild constipation.  The bladder is mildly distended and grossly unremarkable in appearance, though partially compressed by the patient'Tina ascites. The uterus demonstrates new heterogeneity and  multiple prominent areas of cystic change, raising question for endometrial carcinoma given the abdominal findings. The ovaries are not well assessed. No inguinal lymphadenopathy is seen.  No acute osseous abnormalities are identified. Right hip hardware is incompletely imaged but appears grossly unremarkable. There is chronic compression deformity involving the superior endplate of vertebral body T11.  IMPRESSION: 1. New moderate to large volume ascites  throughout the abdomen and pelvis. 2. Diffuse infiltration of the omentum at the left upper quadrant and left mid abdomen, with associated nodularity, suspicious for omental caking. Heterogeneous soft tissue thickening with regard to the inferior aspect of the ascites within the pelvis, raising concern for dependent metastases. 3. The primary source of the malignancy is not definitely seen, though there is new heterogeneity and multiple prominent areas of cystic change within the uterus, which could reflect endometrial carcinoma. Diagnostic paracentesis could be considered for further evaluation, as deemed clinically appropriate. 4. Mild nonspecific prominence of the intrahepatic biliary ducts, with gallbladder distention. No definite stones identified on CT. 5. Relatively stable 2.0 cm cystic lesion within the body of the pancreas. 6. Few tiny bilateral renal cysts seen. 7. Scattered calcification along the abdominal aorta and its branches. 8. Scattered coronary artery calcifications seen. 9. Colon largely filled with stool, raising concern for mild constipation.   Electronically Signed   By: Roanna Raider M.D.   On: 06/18/2013 07:16   Mr Abd W/wo Cm/mrcp  01/08/2013 *RADIOLOGY REPORT* Clinical Data: Multiple falls. Cystic pancreatic mass on noncontrast abdominal CT. MRI ABDOMEN WITHOUT AND WITH CONTRAST (INCLUDING MRCP) Technique: Multiplanar multisequence MR imaging of the abdomen was performed both before and after the administration of intravenous contrast.  Heavily T2-weighted images of the biliary and pancreatic ducts were obtained, and three-dimensional MRCP images were rendered by post processing. Contrast: 10mL MULTIHANCE GADOBENATE DIMEGLUMINE 529 MG/ML IV SOLN Comparison: Noncontrast abdominal CT 01/05/2013. Findings: The study is moderately degraded by motion on this inpatient who had difficulty suspending respiration especially towards the end of the study which includes the post contrast images. As demonstrated on CT, there is a well-circumscribed cystic lesion within the pancreatic tail, measuring 2.5 cm in diameter. This demonstrates T2 hyperintensity and a dependent 4 mm nodular component posteriorly. Postcontrast, no significant enhancement of the walls of this cystic lesion or the dependent nodule are identified. Upstream from the cystic lesion, there is mild dilatation of the pancreatic duct in the pancreatic tail. The pancreatic duct within the pancreatic body and head is normal in caliber. The cystic lesion may communicate with the pancreatic duct. There is no intra or extrahepatic biliary dilatation. There is a probable small gallstone dependently within the gallbladder lumen. No focal abnormalities of the liver, spleen, adrenal glands or kidneys are demonstrated. There is mild atelectasis at both lung bases. Moderate stool is present throughout the colon. There are no enlarged lymph nodes within the upper abdomen. Aortic atherosclerosis is noted. IMPRESSION: 1. Well-circumscribed cystic lesion within the pancreatic tail may communicate with the pancreatic duct and/or extrinsically compress it, resulting in mild dilatation of the duct within the pancreatic tail. The cystic lesion demonstrates no solid components or suspicious enhancement. This could reflect postinflammatory pseudocyst or a low grade cystic neoplasm (intraductal mucinous papillary neoplasm or serous cystadenoma). 2. Probable small gallstone. No biliary dilatation. 3. No other  significant findings. Given the patient'Tina age of 17 years, imaging follow-up of the pancreatic lesion in 6 months is suggested. That could be performed with ultrasound, CT or MRI as the primary role is to assess size stability. This recommendation follows ACR consensus guidelines: Managing Incidental Findings on Abdominal CT: White Paper of the ACR Incidental Findings Committee. Earlyne Iba Radiol [phone] Original Report Authenticated By: Carey Bullocks, M.D.    Ct Head Wo Contrast  01/04/2013 *RADIOLOGY REPORT* Clinical Data: The patient fell out of a chair striking the left side of the head. Left scalp  contusion. Head and neck pain. CT HEAD WITHOUT CONTRAST CT CERVICAL SPINE WITHOUT CONTRAST Technique: Multidetector CT imaging of the head and cervical spine was performed following the standard protocol without intravenous contrast. Multiplanar CT image reconstructions of the cervical spine were also generated. Comparison: 10/24/2011 CT HEAD Findings: Diffuse cerebral atrophy. Mild ventricular dilatation consistent with central atrophy. Low attenuation changes in the deep white matter consistent with small vessel ischemia. No mass effect or midline shift. No abnormal extra-axial fluid collections. Gray-white matter junctions are distinct. Basal cisterns are not effaced. No evidence of acute intracranial hemorrhage. No depressed skull fractures. Visualized paranasal sinuses and mastoid air cells are not opacified. Note that the odontoid process is present in the cisterna magna consistent with basilar invagination. IMPRESSION: No acute intracranial abnormalities. Chronic atrophy and small vessel ischemic change. CT CERVICAL SPINE Findings: There is chronic abnormal alignment of the C1-2 interspace with posterior subluxation of C2 with respect to C1 resulting in increased space between the anterior arch of C1 and the odontoid process and decreased space between the posterior aspect of the odontoid process and  the posterior elements of C1. Canal measures 6.5 mm at its lowest point, similar to previous study. The tip of the odontoid process extends above the level of the clivus. 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 This is stable since the previous study. The remainder the cervical vertebrae demonstrate normal alignment. Facet joints are normally aligned. Degenerative narrowing of the cervical disc spaces with endplate hypertrophic changes. No vertebral compression deformities. No prevertebral soft tissue swelling. No focal bone lesion or bone destruction. Bone cortex and trabecular architecture appear intact. Diffuse bone demineralization. 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 This is stable since the previous study. No acute displaced fractures are identified. Original Report Authenticated By: Burman Nieves, M.D.   Ct Cervical Spine Wo Contrast  01/04/2013 *RADIOLOGY REPORT* Clinical Data: The patient fell out of a chair striking the left side of the head. Left scalp contusion. Head and neck pain. CT HEAD WITHOUT CONTRAST CT CERVICAL SPINE WITHOUT CONTRAST Technique: Multidetector CT imaging of the head and cervical spine was performed following the standard protocol without intravenous contrast. Multiplanar CT image reconstructions of the cervical spine were also generated. Comparison: 10/24/2011 CT HEAD Findings: Diffuse cerebral atrophy. Mild ventricular dilatation consistent with central atrophy. Low attenuation changes in the deep white matter consistent with small vessel ischemia. No mass effect or midline shift. No abnormal extra-axial fluid collections. Gray-white matter junctions are distinct. Basal cisterns are not effaced. No evidence of acute intracranial hemorrhage. No depressed skull fractures. Visualized paranasal sinuses and mastoid air cells are  not opacified. Note that the odontoid process is present in the cisterna magna consistent with basilar invagination. IMPRESSION: No acute intracranial abnormalities. Chronic atrophy and small vessel ischemic change. CT CERVICAL SPINE Findings: There is chronic abnormal alignment of the C1-2 interspace with posterior subluxation of C2 with respect to C1 resulting in increased space between the anterior arch of C1 and the odontoid process and decreased space between the posterior aspect of the odontoid process and the posterior elements of C1. Canal measures 6.5 mm at its lowest point, similar to previous study. The tip of the odontoid process extends above the level of the clivus. 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 This is stable since the previous study. The remainder the cervical vertebrae demonstrate normal alignment. Facet joints are  normally aligned. Degenerative narrowing of the cervical disc spaces with endplate hypertrophic changes. No vertebral compression deformities. No prevertebral soft tissue swelling. No focal bone lesion or bone destruction. Bone cortex and trabecular architecture appear intact. Diffuse bone demineralization. 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 This is stable since the previous study. No acute displaced fractures are identified. Original Report Authenticated By: Burman Nieves, M.D.   Accession: [id number] Received: 01/09/2013 Jeani Hawking DOB: 07-Nov-1929 Age: 79 Gender: F Reported: 01/10/2013 [address] YTOPATHOLOGY REPORT Adequacy Reason Satisfactory For Evaluation. Diagnosis FINE NEEDLE ASPIRATION, ENDOSCOPIC PANCREATIC CYST: ATYPICAL CELLS, SUSPICIOUS OF MUCINOUS NEOPLASM SEE COMMENT COMMENT: THE CASE WAS REVIEWED WITH DR Raynald Blend, WHO CONCURS. THE CASE WAS DISCUSSED WITH DR HUNG ON 01/10/2013. [place] RUND  DO Pathologist,   A/P: 79 y.o. female asked to see for evaluation of pancreatic mass which FNA biopsy was positive for atypical cells suspicious for.mucinous neoplasm, initially found in July of this year, felt not to be resectable. Upon presentation today, with nausea and vomiting, M. Date abdominal discomfort, oncology consultation was sought.  Dr. Truett Perna   is to see the patient following this consult with recommendations regarding diagnosis, treatment options and further workup studies. An addendum to this note is to be written. Thank you for the referral.  Marcos Eke, PA-C 06/18/2013 11:59 AM  Ms. Gasparyan was interviewed and examined. She does not give a consistent history. She has a history of a cystic pancreas lesion that appears stable on the admission CT scan. There is evidence of abdominal carcinomatosis. It is possible the carcinomatosis is related to the cystic pancreas mass, but  not likely. She may have carcinomatosis from a GYN or GI primary. She will most likely not be a candidate for systemic chemotherapy based on her age, comorbid medical conditions, and psychosis. We will likely recommend hospice care.   Impression: 1. 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 Cystic changes of the uterus 3. Cystic pancreas lesion-stable on the CT 06/18/2013, status post an FNA biopsy 01/09/2013 with cells suspicious of a mucinous neoplasm 4. History of psychosis 5. Right femur fracture September 2014, status post ORIF on 03/19/2013 6. Rheumatoid arthritis  Recommendations:  1. Diagnostic/therapeutic paracentesis 2. Followup cytology results and decide on the indication for additional diagnostic testing 3. Consider hospice care after discussions with the patient and family

## 2013-06-18 NOTE — ED Notes (Signed)
Returned from CT.

## 2013-06-18 NOTE — Consult Note (Signed)
Referring Provider: No ref. provider found Primary Care Physician:  No primary provider on file. Primary Gastroenterologist:  None, unassigned  Reason for Consultation:  Pancreatic mass; ascites  HPI: [name] is a 79 y.o. female with a past medical history of cognitive impairment, recent hip fracture in September of 2014, nursing home resident, who was transferred to the emergency department today with complaints of nausea, vomiting, and abdominal pain.  The patient was admitted in July 2014 for urinary tract infection. At that time she had a CT scan of abdomen and pelvis performed on 01/05/2013 that showed a 2.6 x 1.9 cm cystic neoplasm or pseudocyst. This was further worked up with an MRCP which showed a well-circumscribed cystic lesion within the pancreatic tail. During that hospitalization [name] was consulted as he did an endoscopic ultrasound on 01/09/2013 and cytology showed a mucinous cystic neoplasm. Patient was given a referral to General Surgery, as it appears that she was not found to be a surgical candidate. She presents today with a three-day history of abdominal distention associated with generalized abdominal pain, nausea, vomiting, unable to hold anything down. She also complains of generalized weakness, fatigue, overall feeling poorly. She denies hematemesis, bloody stools, melena. She endorses constipation, not having a bowel movement for the last 3 days at least. In the emergency department today she had a repeat CT scan of abdomen and pelvis with contrast shows the following:  "IMPRESSION:  1. New moderate to large volume ascites throughout the abdomen and pelvis.  2. Diffuse infiltration of the omentum at the left upper quadrant  and left mid abdomen, with associated nodularity, suspicious for  omental caking. Heterogeneous soft tissue thickening with regard to the inferior aspect of the ascites within the pelvis, raising  concern for dependent metastases.  3. The  primary source of the malignancy is not definitely seen,  though there is new heterogeneity and multiple prominent areas of  cystic change within the uterus, which could reflect endometrial  carcinoma. Diagnostic paracentesis could be considered for further evaluation, as deemed clinically appropriate.  4. Mild nonspecific prominence of the intrahepatic biliary ducts,  with gallbladder distention. No definite stones identified on CT.  5. Relatively stable 2.0 cm cystic lesion within the body of the  pancreas.  6. Few tiny bilateral renal cysts seen.  7. Scattered calcification along the abdominal aorta and its  branches.  8. Scattered coronary artery calcifications seen.  9. Colon largely filled with stool, raising concern for mild constipation."   GI is being consulted for recommendations regarding these findings.   Past Medical History  Diagnosis Date  . Hypertension   . Dysrhythmia   . Fall 06/01/11    twice this year  . Incontinence of urine     wears depends  . 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 Psychoses 05/2011    Hallucinations and AMS in setting of low Na, UTI, and steroid use   . Stroke   . Seizures   . Rheumatoid arthritis(714.0)     With known cervical subluxation  . Encephalopathy, unspecified   . Anemia   . Generalized anxiety disorder   . Unspecified vitamin D deficiency   . Edema   . Unspecified hypothyroidism   . Unspecified constipation   . Insomnia, unspecified   . Dementia, unspecified, with behavioral disturbance   . Anxiety state, unspecified   . Coronary atherosclerosis of native coronary artery   . Unspecified late effects of cerebrovascular disease   .  Osteoporosis, unspecified     Past Surgical History  Procedure Laterality Date  . Toe surgery      great toe bilateral feet due to arthrits  . Eus N/A 01/09/2013    Procedure: UPPER ENDOSCOPIC ULTRASOUND (EUS) LINEAR;  Surgeon: [name], MD;  Location: [facility];   Service: Endoscopy;  Laterality: N/A;  . Fine needle aspiration N/A 01/09/2013    Procedure: FINE NEEDLE ASPIRATION (FNA) LINEAR;  Surgeon: [name], MD;  Location: [facility];  Service: Endoscopy;  Laterality: N/A;  . Eye surgery  2008    cataract-Dr. [name]  . Femur im nail Right 03/18/2013    Procedure: INTRAMEDULLARY (IM) NAIL FEMORAL;  Surgeon: [name], MD;  Location: [facility];  Service: Orthopedics;  Laterality: Right;    Prior to Admission medications   Medication Sig Start Date End Date Taking? Authorizing Provider  acetaminophen (TYLENOL) 500 MG tablet Take 500 mg by mouth every 6 (six) hours as needed for pain.   Yes Historical Provider, MD  alum & mag hydroxide-simeth (MAALOX/MYLANTA) 200-200-20 MG/5ML suspension Take 30 mLs by mouth every 6 (six) hours as needed for indigestion.   Yes Historical Provider, MD  aspirin EC 81 MG tablet Take 1 tablet (81 mg total) by mouth daily. 03/22/13  Yes Jeralyn Bennett, MD  Cholecalciferol (VITAMIN D3) 5000 UNITS CAPS Take 1 capsule by mouth daily.   Yes Historical Provider, MD  docusate sodium 100 MG CAPS Take 100 mg by mouth 2 (two) times daily. 03/22/13  Yes Jeralyn Bennett, MD  HYDROcodone-acetaminophen (NORCO/VICODIN) 5-325 MG per tablet Take 1-2 tablets by mouth every 6 (six) hours as needed. 03/22/13  Yes Jeralyn Bennett, MD  lactulose (CHRONULAC) 10 GM/15ML solution Take 30 mLs (20 g total) by mouth 2 (two) times daily as needed (Constipation). 03/22/13  Yes Jeralyn Bennett, MD  levothyroxine (SYNTHROID, LEVOTHROID) 25 MCG tablet Take 25 mcg by mouth daily before breakfast.   Yes Historical Provider, MD  LORazepam (ATIVAN) 0.5 MG tablet Take 0.5 mg by mouth every 8 (eight) hours.   Yes Historical Provider, MD  magnesium hydroxide (MILK OF MAGNESIA) 400 MG/5ML suspension Take 30 mLs by mouth at bedtime as needed for constipation.   Yes Historical Provider, MD  polyethylene glycol (MIRALAX / GLYCOLAX) packet Take 17 g by mouth every 3  (three) days.   Yes Historical Provider, MD  vitamin C (ASCORBIC ACID) 500 MG tablet Take 500 mg by mouth 2 (two) times daily.   Yes Historical Provider, MD  zinc sulfate 220 MG capsule Take 220 mg by mouth daily.   Yes Historical Provider, MD    Current Facility-Administered Medications  Medication Dose Route Frequency Provider Last Rate Last Dose  . 0.9 %  sodium chloride infusion   Intravenous Continuous Jeralyn Bennett, MD      . alum & mag hydroxide-simeth (MAALOX/MYLANTA) 200-200-20 MG/5ML suspension 30 mL  30 mL Oral Q6H PRN Jeralyn Bennett, MD      . aspirin EC tablet 81 mg  81 mg Oral Daily Jeralyn Bennett, MD      . bisacodyl (DULCOLAX) suppository 10 mg  10 mg Rectal Daily PRN Jeralyn Bennett, MD      . docusate sodium (COLACE) capsule 100 mg  100 mg Oral BID Jeralyn Bennett, MD      . heparin injection 5,000 Units  5,000 Units Subcutaneous Q8H Jeralyn Bennett, MD      . Melene Muller ON 06/19/2013] levothyroxine (SYNTHROID, LEVOTHROID) tablet 25 mcg  25 mcg  Oral QAC breakfast Jeralyn Bennett, MD      . LORazepam (ATIVAN) tablet 0.5 mg  0.5 mg Oral Q8H Jeralyn Bennett, MD      . magnesium hydroxide (MILK OF MAGNESIA) suspension 30 mL  30 mL Oral QHS PRN Jeralyn Bennett, MD      . milk and molasses enema  1 enema Rectal Once Jeralyn Bennett, MD      . morphine 4 MG/ML injection 4 mg  4 mg Intravenous Once Olivia Mackie, MD      . ondansetron [facility]) injection 4 mg  4 mg Intravenous Q6H PRN Jeralyn Bennett, MD      . oxyCODONE (Oxy IR/ROXICODONE) immediate release tablet 5 mg  5 mg Oral Q4H PRN Jeralyn Bennett, MD      . oxyCODONE-acetaminophen (PERCOCET/ROXICET) 5-325 MG per tablet 1 tablet  1 tablet Oral Q6H PRN Olivia Mackie, MD      . polyethylene glycol (MIRALAX / GLYCOLAX) packet 17 g  17 g Oral Q3 days Jeralyn Bennett, MD      . senna [facility]) tablet 8.6 mg  1 tablet Oral BID Jeralyn Bennett, MD        Allergies as of 06/18/2013 - Review Complete 06/18/2013  Allergen Reaction Noted   . Hydrochlorothiazide  01/16/2013  . Leflunomide  01/16/2013  . Propoxyphene and methadone  01/16/2013  . Remicade [infliximab]  01/16/2013  . Zinc  01/16/2013  . Colchicine Anxiety   . Famotidine Anxiety   . Penicillins Anxiety 06/14/2011    Family History  Problem Relation Age of Onset  . Congestive Heart Failure Mother     History   Social History  . Marital Status: Widowed    Spouse Name: N/A    Number of Children: N/A  . Years of Education: N/A   Occupational History  . Not on file.   Social History Main Topics  . Smoking status: Never Smoker   . Smokeless tobacco: Never Used  . Alcohol Use: No  . Drug Use: No  . Sexual Activity: No   Other Topics Concern  . Not on file   Social History Narrative   [name] [phone]. [facility] [phone]. Lewanda Rife niece [phone]    Review of Systems: Ten point ROS is O/W negative except as mentioned in HPI.  Physical Exam: Vital signs in last 24 hours: Temp:  [98.9 F (37.2 C)-99.1 F (37.3 C)] 98.9 F (37.2 C) (12/23 0935) Pulse Rate:  [85-98] 98 (12/23 0935) Resp:  [18] 18 (12/23 0935) BP: (111-132)/(64-79) 128/76 mmHg (12/23 0935) SpO2:  [95 %-100 %] 100 % (12/23 0935) Weight:  [128 lb 11.2 oz (58.378 kg)] 128 lb 11.2 oz (58.378 kg) (12/23 0935) Last BM Date: 06/17/13 (pt states was "soupy") General:  Alert, pleasant and cooperative in NAD Head:  Normocephalic and atraumatic. Eyes:  Sclera clear, no icterus.  Conjunctiva pink. Ears:  Normal auditory acuity. Mouth:  No deformity or lesions.   Lungs:  Clear throughout to auscultation.  No wheezes, crackles, or rhonchi.  Heart:  Regular rate and rhythm; no murmurs, clicks, rubs,  or gallops. Abdomen:  Soft, distended due to ascites fluid.  Diffuse mild TTP without R/R/G.   Rectal:  Deferred  Msk:  Symmetrical without gross deformities. Pulses:  Normal pulses noted. Extremities:  Without clubbing or edema. Neurologic:  Alert and  oriented x4;   grossly normal neurologically. Skin:  Intact without significant lesions or rashes. Psych:  Alert and  cooperative. Normal mood and affect.  Lab Results:  Recent Labs  06/18/13 0520  WBC 7.9  HGB 12.1  HCT 35.2*  PLT 441*   BMET  Recent Labs  06/18/13 0520  NA 128*  K 3.8  CL 90*  CO2 25  GLUCOSE 109*  BUN 14  CREATININE 0.43*  CALCIUM 9.0   LFT  Recent Labs  06/18/13 0520  PROT 7.0  ALBUMIN 2.5*  AST 13  ALT <5  ALKPHOS 70  BILITOT 0.4   Studies/Results: Ct Abdomen Pelvis W Contrast  06/18/2013   CLINICAL DATA:  Nausea and vomiting.  EXAM: CT ABDOMEN AND PELVIS WITH CONTRAST  TECHNIQUE: Multidetector CT imaging of the abdomen and pelvis was performed using the standard protocol following bolus administration of intravenous contrast.  CONTRAST:  100 mL of Omnipaque 300 IV contrast  COMPARISON:  CT of the abdomen and pelvis performed 01/05/2013  FINDINGS: The visualized lung bases are clear. Scattered coronary artery calcifications are seen.  There is new moderate to large volume ascites throughout the abdomen and pelvis. This surrounds small and large bowel loops, and the liver and spleen, with mass effect on the bladder.  There is diffuse infiltration of the omentum at the left upper quadrant and left mid abdomen, with irregular nodularity, most compatible with omental caking. In addition, there is heterogeneous soft tissue thickening with regard to the inferior aspect of the ascites within the pelvis, raising concern for dependent metastases. The primary source of the patient's apparent malignancy is not definitely identified on this study.  The common hepatic duct measures 0.8 cm in maximal diameter, within normal limits given the patient's age. There is mild nonspecific prominence of the intrahepatic biliary ducts. The gallbladder is distended. No definite gallstones are seen on CT. A 2.0 cm cystic lesion within the body of the pancreas is grossly unchanged from the  prior study. The pancreas is otherwise unremarkable. The adrenal glands are within normal limits.  A few tiny bilateral renal cysts are seen; the kidneys are otherwise unremarkable in appearance. There is no evidence of hydronephrosis. No renal or ureteral stones are identified. No perinephric stranding is appreciated.  No free fluid is identified. The small bowel is unremarkable in appearance. The stomach is within normal limits. No acute vascular abnormalities are seen. Scattered calcification is noted along the abdominal aorta and its branches.  The appendix is not definitely characterized; there is no evidence for appendicitis. The colon is largely filled with stool, raising concern for mild constipation.  The bladder is mildly distended and grossly unremarkable in appearance, though partially compressed by the patient's ascites. The uterus demonstrates new heterogeneity and multiple prominent areas of cystic change, raising question for endometrial carcinoma given the abdominal findings. The ovaries are not well assessed. No inguinal lymphadenopathy is seen.  No acute osseous abnormalities are identified. Right hip hardware is incompletely imaged but appears grossly unremarkable. There is chronic compression deformity involving the superior endplate of vertebral body T11.  IMPRESSION: 1. New moderate to large volume ascites throughout the abdomen and pelvis. 2. Diffuse infiltration of the omentum at the left upper quadrant and left mid abdomen, with associated nodularity, suspicious for omental caking. Heterogeneous soft tissue thickening with regard to the inferior aspect of the ascites within the pelvis, raising concern for dependent metastases. 3. The primary source of the malignancy is not definitely seen, though there is new heterogeneity and multiple prominent areas of cystic change within the uterus, which could reflect endometrial carcinoma. Diagnostic  paracentesis could be considered for further  evaluation, as deemed clinically appropriate. 4. Mild nonspecific prominence of the intrahepatic biliary ducts, with gallbladder distention. No definite stones identified on CT. 5. Relatively stable 2.0 cm cystic lesion within the body of the pancreas. 6. Few tiny bilateral renal cysts seen. 7. Scattered calcification along the abdominal aorta and its branches. 8. Scattered coronary artery calcifications seen. 9. Colon largely filled with stool, raising concern for mild constipation.   Electronically Signed   By: Roanna Raider M.D.   On: 06/18/2013 07:16    IMPRESSION:  -Malignant carcinomatosis and moderate to large volume ascites as evident on CT scan:  Unlikely that this is from the pancreatic cyst.  ? GYN source with abnormal uterine findings on CT scan. -Pancreatic cyst C/W mucinous cystic neoplasm, by EUS and cytology in July.  Apparently not a surgical candidate.    PLAN: -Check CEA, CA 19-9, CA 125. -Diagnostic paracentesis with cell count, cytology, and culture fluid studies. -Dedicated pelvic ultrasound or the uterus vs vaginal ultrasound per primary service.   Carla Rashad D.  06/18/2013, 12:43 PM  Pager number [phone]

## 2013-06-19 DIAGNOSIS — E871 Hypo-osmolality and hyponatremia: Secondary | ICD-10-CM

## 2013-06-19 DIAGNOSIS — C762 Malignant neoplasm of abdomen: Secondary | ICD-10-CM

## 2013-06-19 LAB — CEA: CEA: <0.5 ng/mL (ref 0.0–5.0)

## 2013-06-19 LAB — COMPREHENSIVE METABOLIC PANEL
ALT: <5 U/L (ref 0–35)
AST: 12 U/L (ref 0–37)
Albumin: 1.8 g/dL — ABNORMAL LOW (ref 3.5–5.2)
Alkaline Phosphatase: 53 U/L (ref 39–117)
BUN: 9 mg/dL (ref 6–23)
CO2: 24 mEq/L (ref 19–32)
Calcium: 8.2 mg/dL — ABNORMAL LOW (ref 8.4–10.5)
Chloride: 96 mEq/L (ref 96–112)
Creatinine, Ser: 0.39 mg/dL — ABNORMAL LOW (ref 0.50–1.10)
GFR calc Af Amer: >90 mL/min (ref >90)
Potassium: 3.4 mEq/L — ABNORMAL LOW (ref 3.5–5.1)
Sodium: 129 mEq/L — ABNORMAL LOW (ref 135–145)

## 2013-06-19 LAB — CBC
Hemoglobin: 11.4 g/dL — ABNORMAL LOW (ref 12.0–15.0)
MCH: 28.9 pg (ref 26.0–34.0)
MCHC: 34.2 g/dL (ref 30.0–36.0)
MCV: 84.3 fL (ref 78.0–100.0)
Platelets: 321 10*3/uL (ref 150–400)
RBC: 3.95 MIL/uL (ref 3.87–5.11)

## 2013-06-19 LAB — CA 125: CA 125: 399.6 U/mL — ABNORMAL HIGH (ref 0.0–30.2)

## 2013-06-19 MED ORDER — CIPROFLOXACIN IN D5W 400 MG/200ML IV SOLN
400.0000 mg | Freq: Two times a day (BID) | INTRAVENOUS | Status: DC
  Administered 2013-06-19 – 2013-06-20 (×3): 400 mg via INTRAVENOUS
  Filled 2013-06-19 (×4): qty 200

## 2013-06-19 MED ORDER — PRO-STAT SUGAR FREE PO LIQD
30.0000 mL | Freq: Two times a day (BID) | ORAL | Status: DC
  Administered 2013-06-19 – 2013-06-26 (×9): 30 mL via ORAL
  Filled 2013-06-19 (×16): qty 30

## 2013-06-19 MED ORDER — BOOST / RESOURCE BREEZE PO LIQD: 1.0000 | Freq: Two times a day (BID) | ORAL | Status: DC

## 2013-06-19 NOTE — Progress Notes (Addendum)
INITIAL NUTRITION ASSESSMENT  DOCUMENTATION CODES Per approved criteria  -Not Applicable   INTERVENTION: -Encouraged patient to consume meals -Recommend the addition of Pro-Stat BID w/meals  NUTRITION DIAGNOSIS: Inadequate oral intake related to n/v/abd pain as evidenced by PO intake <75% est nutritional needs   Goal: Pt to meet >/= 90% of their estimated nutrition needs    Monitor:  Total protein/energy intake, GI profile, supplement tolerance, labs, weights  Reason for Assessment: Low Braden  79 y.o. female  Admitting Dx: <principal problem not specified>  ASSESSMENT: 79 yo female admitted with abdominal pain.Hx of cognitive impairment, recent hip fracture  -Pt reported gradual unintentional wt loss of approximately 25 lbs since falls in 2012. -Pt w/abd pain, constipation, nausea and vomiting prior to admission. Pt denied any significant changes in appetite prior to admission; however, MD did note pt with dehydration. On Mechanical soft diet at SNF per MST -Given bowel regimen to assist with bowel movements -MD noted pt with ascites and abdominal malignancy  -Per discussion with RN and Nurse Tech, pt consuming 25% of meals, eating only juice and jello. Will recommend Pro-stat BID to assist in meeting est nutritional needs for malignancy and stage 2 sacral ulcer -Pt allergic to Zinc, reported adverse reaction of "measle/rash like" symptoms. Pro-Stat supplement does not contain zinc  Height: Ht Readings from Last 1 Encounters:  06/18/13 5\' 3"  (1.6 m)    Weight: Wt Readings from Last 1 Encounters:  06/18/13 128 lb 11.2 oz (58.378 kg)    Ideal Body Weight: 115 lbs/52.3kg  % Ideal Body Weight: 111%  Wt Readings from Last 10 Encounters:  06/18/13 128 lb 11.2 oz (58.378 kg)  03/18/13 134 lb 0.6 oz (60.8 kg)  03/18/13 134 lb 0.6 oz (60.8 kg)  01/11/13 138 lb 8 oz (62.823 kg)  01/11/13 138 lb 8 oz (62.823 kg)  01/11/13 138 lb 8 oz (62.823 kg)  11/11/11 113 lb 1.5 oz  (51.3 kg)  10/26/11 124 lb (56.246 kg)  06/20/11 129 lb 13.6 oz (58.9 kg)  08/19/08 137 lb 2.1 oz (62.202 kg)    Usual Body Weight: 150 lbs/68.2 kg  % Usual Body Weight: 85%  BMI:  Body mass index is 22.8 kg/(m^2).  Estimated Nutritional Needs: Kcal:1700-1900 Protein: 75-85 gram Fluid: 2000 ml/daily  Skin: Stage 2 sacral pressure ulcer  Diet Order: Clear Liquid  EDUCATION NEEDS: -No education needs identified at this time   Intake/Output Summary (Last 24 hours) at 06/19/13 1107 Last data filed at 06/19/13 0839  Gross per 24 hour  Intake   1410 ml  Output    200 ml  Net   1210 ml    Last BM: 12/23   Labs:   Recent Labs Lab 06/18/13 0520 06/19/13 0730  NA 128* 129*  K 3.8 3.4*  CL 90* 96  CO2 25 24  BUN 14 9  CREATININE 0.43* 0.39*  CALCIUM 9.0 8.2*  GLUCOSE 109* 83    CBG (last 3)  No results found for this basename: GLUCAP,  in the last 72 hours  Scheduled Meds: . aspirin EC  81 mg Oral Daily  . docusate sodium  100 mg Oral BID  . heparin  5,000 Units Subcutaneous Q8H  . levothyroxine  25 mcg Oral QAC breakfast  . LORazepam  0.5 mg Oral Q8H  .  morphine injection  4 mg Intravenous Once  . polyethylene glycol  17 g Oral Q3 days  . senna  1 tablet Oral BID    Continuous  Infusions: . sodium chloride 75 mL/hr at 06/19/13 0141    Past Medical History  Diagnosis Date  . Hypertension   . Dysrhythmia   . Fall 06/01/11    twice this year  . Incontinence of urine     wears depends  . 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 Psychoses 05/2011    Hallucinations and AMS in setting of low Na, UTI, and steroid use   . Stroke   . Seizures   . Rheumatoid arthritis(714.0)     With known cervical subluxation  . Encephalopathy, unspecified   . Anemia   . Generalized anxiety disorder   . Unspecified vitamin D deficiency   . Edema   . Unspecified hypothyroidism   . Unspecified constipation   . Insomnia, unspecified   . Dementia,  unspecified, with behavioral disturbance   . Anxiety state, unspecified   . Coronary atherosclerosis of native coronary artery   . Unspecified late effects of cerebrovascular disease   . Osteoporosis, unspecified     Past Surgical History  Procedure Laterality Date  . Toe surgery      great toe bilateral feet due to arthrits  . Eus N/A 01/09/2013    Procedure: UPPER ENDOSCOPIC ULTRASOUND (EUS) LINEAR;  Surgeon: [name], MD;  Location: [facility];  Service: Endoscopy;  Laterality: N/A;  . Fine needle aspiration N/A 01/09/2013    Procedure: FINE NEEDLE ASPIRATION (FNA) LINEAR;  Surgeon: [name], MD;  Location: [facility];  Service: Endoscopy;  Laterality: N/A;  . Eye surgery  2008    cataract-Dr. [name]  . Femur im nail Right 03/18/2013    Procedure: INTRAMEDULLARY (IM) NAIL FEMORAL;  Surgeon: [name], MD;  Location: [facility];  Service: Orthopedics;  Laterality: Right;    Lloyd Huger MS RD LDN Clinical Dietitian Pager:[phone]

## 2013-06-19 NOTE — Evaluation (Signed)
Physical Therapy Evaluation Patient Details Name: [name] MRN: [id number] DOB: 02-19-1930 Today's Date: 06/19/2013 Time: 4098-1191 PT Time Calculation (min): 15 min  PT Assessment / Plan / Recommendation History of Present Illness  79 yo female admitted from SNF with abdominal malignancy. Paraentesis on 12/23. Hx o RA, HTN, psychosis, fall, R femur ORIF 02/2013  Clinical Impression  On eval, pt required +2 assist for bed mobility and Mod assist for sitting EOB. Limited participation and effort from pt. Recommend return to ALF with 24 hour supervision/assist as long as facility is able to continue to provide level of care. If not then may need to consider SNF.     PT Assessment  Patient needs continued PT services    Follow Up Recommendations  Supervision/Assistance - 24 hour;Home health PT-at ALF (Return to ALF as long as facility can provide current level of assist. If not then SNF.)    Does the patient have the potential to tolerate intense rehabilitation      Barriers to Discharge        Equipment Recommendations  None recommended by PT    Recommendations for Other Services     Frequency Min 2X/week    Precautions / Restrictions Precautions Precautions: Fall Restrictions Weight Bearing Restrictions: No RLE Weight Bearing: Weight bearing as tolerated Other Position/Activity Restrictions: per PT note s/p ORIF 02/2013   Pertinent Vitals/Pain Generalized pain-unrated      Mobility  Bed Mobility Bed Mobility: Supine to Sit;Sit to Supine Supine to Sit: 1: +2 Total assist Supine to Sit: Patient Percentage: 20% Sit to Supine: 1: +2 Total assist Sit to Supine: Patient Percentage: 0% Details for Bed Mobility Assistance: Max encouragement for pt participation. Assist for trunk and bil LEs. Utilized bedpad for scooting, positioning.  Transfers Transfers: Not assessed Ambulation/Gait Ambulation/Gait Assistance: Not tested (comment)    Exercises     PT Diagnosis:  Generalized weakness  PT Problem List: Decreased strength;Decreased range of motion;Decreased activity tolerance;Decreased balance;Decreased mobility;Decreased cognition;Decreased knowledge of use of DME;Pain PT Treatment Interventions: Functional mobility training;Therapeutic activities;Therapeutic exercise;Balance training;Patient/family education;DME instruction     PT Goals(Current goals can be found in the care plan section) Acute Rehab PT Goals Patient Stated Goal: none stated PT Goal Formulation: Patient unable to participate in goal setting Time For Goal Achievement: 07/03/13 Potential to Achieve Goals: Fair  Visit Information  Last PT Received On: 06/19/13 Assistance Needed: +2 History of Present Illness: 79 yo female admitted from SNF with abdominal malignancy. Paraentesis on 12/23. Hx o RA, HTN, psychosis, fall, R femur ORIF 02/2013       Prior Functioning  Home Living Family/patient expects to be discharged to:: Assisted living Home Equipment: Dan Humphreys - 2 wheels;Wheelchair - manual Additional Comments: Pt reports she receives A with ADL activity.  Pt states she also gets help eating ( hands very arthritic) Prior Function Level of Independence: Needs assistance ADL's / Homemaking Assistance Needed: Staff at [facility] report pt is total assist for all ADLs and transfers. She was walking with assist months ago but since that time has not ambulated and is transferred to a WC Communication Communication: No difficulties    Cognition  Cognition Arousal/Alertness: Lethargic Behavior During Therapy: WFL for tasks assessed/performed Overall Cognitive Status: Difficult to assess    Extremity/Trunk Assessment Upper Extremity Assessment Upper Extremity Assessment: Generalized weakness Lower Extremity Assessment Lower Extremity Assessment: Generalized weakness Cervical / Trunk Assessment Cervical / Trunk Assessment: Kyphotic   Balance Balance Balance Assessed: Yes Static  Sitting Balance  Static Sitting - Balance Support: Feet supported;No upper extremity supported;Bilateral upper extremity supported Static Sitting - Level of Assistance: 3: Mod assist Static Sitting - Comment/# of Minutes: Brief seconds in which pt was able to maintain static upright posture. However, mostly pt required Mod assist. Leaning and LOB reapeatedly.  Dynamic Sitting Balance Dynamic Sitting - Balance Support: No upper extremity supported;Bilateral upper extremity supported;Feet supported Dynamic Sitting - Level of Assistance: 3: Mod assist Dynamic Sitting - Comments: Attempted reaching x 2.   End of Session PT - End of Session Activity Tolerance: Patient limited by fatigue Patient left: in bed;with call bell/phone within reach;with bed alarm set  GP     Rebeca Alert, MPT Pager: [phone]

## 2013-06-19 NOTE — Consult Note (Signed)
GI Attending Note   Chart was reviewed and patient was examined. X-rays and lab were reviewed.    I agree with management and plans.  Elaya Droege D. Ahria Slappey, M.D., FACG [facility] Cell [phone]  

## 2013-06-19 NOTE — Progress Notes (Signed)
Progress Note   Subjective  Endorses abdominal pain "all over".    Objective   Vital signs in last 24 hours: Temp:  [97.7 F (36.5 C)-98.3 F (36.8 C)] 98.3 F (36.8 C) (12/24 0459) Pulse Rate:  [80-100] 80 (12/24 0459) Resp:  [16-18] 16 (12/24 0459) BP: (97-133)/(58-72) 97/58 mmHg (12/24 0459) SpO2:  [0 %-100 %] 0 % (12/24 0459) Last BM Date: 06/18/13 General:    black female in NAD Abdomen:  Soft, nontender, mildly distended with bowel sounds.    Lab Results:  Recent Labs  06/18/13 0520 06/19/13 0730  WBC 7.9 5.7  HGB 12.1 11.4*  HCT 35.2* 33.3*  PLT 441* 321   BMET  Recent Labs  06/18/13 0520 06/19/13 0730  NA 128* 129*  K 3.8 3.4*  CL 90* 96  CO2 25 24  GLUCOSE 109* 83  BUN 14 9  CREATININE 0.43* 0.39*  CALCIUM 9.0 8.2*   LFT  Recent Labs  06/19/13 0730  PROT 5.2*  ALBUMIN 1.8*  AST 12  ALT <5  ALKPHOS 53  BILITOT 0.3   PT/INR  Recent Labs  06/18/13 1242 06/18/13 1429  LABPROT QUESTIONABLE RESULTS, RECOMMEND RECOLLECT TO VERIFY 15.0  INR NOT CALCULATED 1.21    Studies/Results: Ct Abdomen Pelvis W Contrast  06/18/2013   CLINICAL DATA:  Nausea and vomiting.  EXAM: CT ABDOMEN AND PELVIS WITH CONTRAST  TECHNIQUE: Multidetector CT imaging of the abdomen and pelvis was performed using the standard protocol following bolus administration of intravenous contrast.  CONTRAST:  100 mL of Omnipaque 300 IV contrast  COMPARISON:  CT of the abdomen and pelvis performed 01/05/2013  FINDINGS: The visualized lung bases are clear. Scattered coronary artery calcifications are seen.  There is new moderate to large volume ascites throughout the abdomen and pelvis. This surrounds small and large bowel loops, and the liver and spleen, with mass effect on the bladder.  There is diffuse infiltration of the omentum at the left upper quadrant and left mid abdomen, with irregular nodularity, most compatible with omental caking. In addition, there is heterogeneous  soft tissue thickening with regard to the inferior aspect of the ascites within the pelvis, raising concern for dependent metastases. The primary source of the patient's apparent malignancy is not definitely identified on this study.  The common hepatic duct measures 0.8 cm in maximal diameter, within normal limits given the patient's age. There is mild nonspecific prominence of the intrahepatic biliary ducts. The gallbladder is distended. No definite gallstones are seen on CT. A 2.0 cm cystic lesion within the body of the pancreas is grossly unchanged from the prior study. The pancreas is otherwise unremarkable. The adrenal glands are within normal limits.  A few tiny bilateral renal cysts are seen; the kidneys are otherwise unremarkable in appearance. There is no evidence of hydronephrosis. No renal or ureteral stones are identified. No perinephric stranding is appreciated.  No free fluid is identified. The small bowel is unremarkable in appearance. The stomach is within normal limits. No acute vascular abnormalities are seen. Scattered calcification is noted along the abdominal aorta and its branches.  The appendix is not definitely characterized; there is no evidence for appendicitis. The colon is largely filled with stool, raising concern for mild constipation.  The bladder is mildly distended and grossly unremarkable in appearance, though partially compressed by the patient's ascites. The uterus demonstrates new heterogeneity and multiple prominent areas of cystic change, raising question for endometrial carcinoma given the abdominal findings. The ovaries  are not well assessed. No inguinal lymphadenopathy is seen.  No acute osseous abnormalities are identified. Right hip hardware is incompletely imaged but appears grossly unremarkable. There is chronic compression deformity involving the superior endplate of vertebral body T11.  IMPRESSION: 1. New moderate to large volume ascites throughout the abdomen and  pelvis. 2. Diffuse infiltration of the omentum at the left upper quadrant and left mid abdomen, with associated nodularity, suspicious for omental caking. Heterogeneous soft tissue thickening with regard to the inferior aspect of the ascites within the pelvis, raising concern for dependent metastases. 3. The primary source of the malignancy is not definitely seen, though there is new heterogeneity and multiple prominent areas of cystic change within the uterus, which could reflect endometrial carcinoma. Diagnostic paracentesis could be considered for further evaluation, as deemed clinically appropriate. 4. Mild nonspecific prominence of the intrahepatic biliary ducts, with gallbladder distention. No definite stones identified on CT. 5. Relatively stable 2.0 cm cystic lesion within the body of the pancreas. 6. Few tiny bilateral renal cysts seen. 7. Scattered calcification along the abdominal aorta and its branches. 8. Scattered coronary artery calcifications seen. 9. Colon largely filled with stool, raising concern for mild constipation.   Electronically Signed   By: Roanna Raider M.D.   On: 06/18/2013 07:16   US Paracentesis  06/18/2013   CLINICAL DATA:  Patient with history of mucinous cystic neoplasm of the pancreas, cystic changes within the uterus, omental nodularity, ascites. Request is made for diagnostic and therapeutic paracentesis up to 2 liters.  EXAM: ULTRASOUND GUIDED DIAGNOSTIC AND THERAPEUTIC PARACENTESIS  COMPARISON:  None.  PROCEDURE: An ultrasound guided paracentesis was thoroughly discussed with the patient and questions answered. The benefits, risks, alternatives and complications were also discussed. The patient understands and wishes to proceed with the procedure. Written consent was obtained.  Ultrasound was performed to localize and mark an adequate pocket of fluid in the left lower. quadrant of the abdomen. The area was then prepped and draped in the normal sterile fashion. 1% Lidocaine  was used for local anesthesia. Under ultrasound guidance a 19 gauge Yueh catheter was introduced. Paracentesis was performed. The catheter was removed and a dressing applied.  COMPLICATIONS: None immediate  FINDINGS: A total of approximately 1.6 liters. of slightly turbid, yellow fluid was removed. A fluid sample was sent for laboratory analysis.  IMPRESSION: Successful ultrasound guided diagnostic and therapeutic paracentesis yielding 1.6 liters of ascites.  Read by: Jeananne Rama ,P.A.-C.   Electronically Signed   By: Simonne Come M.D.   On: 06/18/2013 16:39     Assessment / Plan:    1. Nausea, vomiting, abdominal pain and distention. CTscan reveals new ascites, omental caking and new cystic change within the uterus possibly reflecting endometrial carcinoma. Patient is s/p diagnostic paracentesis yesterday. Cytology pending. CA-125 elevated at 399. CEA and CA 19-9 normal. 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 Ascitic fluid WBC 1730. With only 9% neut. not sure if this represents bacterial peritonitis vrs just related to malignancy. Will start IV antibiotics pending culture results   2. 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), poor surgical candidate  3. Stool filled colon on CTscan yesterday morning. She had two large BMs after enema yesterday.    LOS: 1 day   Willette Cluster  06/19/2013, 11:31 AM  GI Attending Note  I have personally taken an interval history, reviewed the chart, and examined the patient.  Strongly suspect GYN cancer, especially in view  of elevated CEA 125.  If cytology is not diagnostic I would again consider dedicated pelvic or vaginal ultrasound and GYN exam.  Doubt SBP.  Barbette Hair. Arlyce Dice, MD, [facility] [phone]

## 2013-06-19 NOTE — Progress Notes (Signed)
[facility] PROGRESS NOTE  [name]:[id number] DOB: 1929-07-01 DOA: 06/18/2013 PCP: No primary provider on file.  Assessment/Plan: Active Problems:   HYPERTENSION   Hypothyroidism: Continue Synthroid    Constipation: Started on enemas   Abdominal malignancy: Given elevated CA-125, highly suspicious for a GYN malignancy. Awaiting pathology, however prognosis and treatment options are very limited. Meeting with the patient's sons tomorrow to discuss hospice options   Abdominal pain: Secondary to ascites and malignancy   Nausea and vomiting: Likely secondary to constipation plus malignant ascites   Dehydration: Likely from nausea vomiting   Hyponatremia: Minimal improvement, likely from dehydration   Ascites: Likely secondary to malignancy, status post therapeutic/diagnostic paracentesis   Pancreatic cyst   Code Status: Full code Family Communication: Spoke with patient's son by phone. Discussed about presumed diagnosis and limited treatment options. Plan is to meet with both sons tomorrow to discuss hospice options Disposition Plan: Limited prognosis given presumed diagnosis   Consultants:  Interventional radiology  Oncology  Gastroenterology-[place]  Procedures:  Status post ultrasound-guided paracentesis for diagnosis/therapeutic yielding 1.6 L  Antibiotics:  IV Cipro for SBP prophylaxis day 2  HPI/Subjective: Patient somnolent, does not appear to be in acute distress  Objective: Filed Vitals:   06/19/13 1347  BP: 104/63  Pulse: 85  Temp: 98.2 F (36.8 C)  Resp: 20    Intake/Output Summary (Last 24 hours) at 06/19/13 1747 Last data filed at 06/19/13 1300  Gross per 24 hour  Intake   1410 ml  Output      0 ml  Net   1410 ml   Filed Weights   06/18/13 0935  Weight: 58.378 kg (128 lb 11.2 oz)    Exam:   General:  Somnolent  Cardiovascular: Regular rate and rhythm, S1-S2  Respiratory: Clear to auscultation bilaterally  and  Abdomen: Soft, nondistended, nontender?, Hypoactive bowel sounds  Musculoskeletal: No clubbing or cyanosis, trace edema   Data Reviewed: Basic Metabolic Panel:  Recent Labs Lab 06/18/13 0520 06/19/13 0730  NA 128* 129*  K 3.8 3.4*  CL 90* 96  CO2 25 24  GLUCOSE 109* 83  BUN 14 9  CREATININE 0.43* 0.39*  CALCIUM 9.0 8.2*   Liver Function Tests:  Recent Labs Lab 06/18/13 0520 06/19/13 0730  AST 13 12  ALT <5 <5  ALKPHOS 70 53  BILITOT 0.4 0.3  PROT 7.0 5.2*  ALBUMIN 2.5* 1.8*    Recent Labs Lab 06/18/13 0520  LIPASE 9*   No results found for this basename: AMMONIA,  in the last 168 hours CBC:  Recent Labs Lab 06/18/13 0520 06/19/13 0730  WBC 7.9 5.7  NEUTROABS 6.5  --   HGB 12.1 11.4*  HCT 35.2* 33.3*  MCV 84.4 84.3  PLT 441* 321   Cardiac Enzymes: No results found for this basename: CKTOTAL, CKMB, CKMBINDEX, TROPONINI,  in the last 168 hours BNP (last 3 results)  Recent Labs  06/18/13 0520  PROBNP 340.1   CBG: No results found for this basename: GLUCAP,  in the last 168 hours  Recent Results (from the past 240 hour(s))  MRSA PCR SCREENING     Status: None   Collection Time    06/18/13 10:06 AM      Result Value Range Status   MRSA by PCR NEGATIVE  NEGATIVE Final   Comment:            The GeneXpert MRSA Assay (FDA     approved for NASAL specimens  only), is one component of a     comprehensive MRSA colonization     surveillance program. It is not     intended to diagnose MRSA     infection nor to guide or     monitor treatment for     MRSA infections.  BODY FLUID CULTURE     Status: None   Collection Time    06/18/13  4:20 PM      Result Value Range Status   Specimen Description ASCITIC   Final   Special Requests Normal   Final   Gram Stain     Final   Value: RARE WBC PRESENT,BOTH PMN AND MONONUCLEAR     NO ORGANISMS SEEN     Performed at [facility]   Culture     Final   Value: NO GROWTH 1 DAY     Performed  at [facility]   Report Status PENDING   Incomplete     Studies: Ct Abdomen Pelvis W Contrast  06/18/2013   CLINICAL DATA:  Nausea and vomiting.  EXAM: CT ABDOMEN AND PELVIS WITH CONTRAST  TECHNIQUE: Multidetector CT imaging of the abdomen and pelvis was performed using the standard protocol following bolus administration of intravenous contrast.  CONTRAST:  100 mL of Omnipaque 300 IV contrast  COMPARISON:  CT of the abdomen and pelvis performed 01/05/2013  FINDINGS: The visualized lung bases are clear. Scattered coronary artery calcifications are seen.  There is new moderate to large volume ascites throughout the abdomen and pelvis. This surrounds small and large bowel loops, and the liver and spleen, with mass effect on the bladder.  There is diffuse infiltration of the omentum at the left upper quadrant and left mid abdomen, with irregular nodularity, most compatible with omental caking. In addition, there is heterogeneous soft tissue thickening with regard to the inferior aspect of the ascites within the pelvis, raising concern for dependent metastases. The primary source of the patient's apparent malignancy is not definitely identified on this study.  The common hepatic duct measures 0.8 cm in maximal diameter, within normal limits given the patient's age. There is mild nonspecific prominence of the intrahepatic biliary ducts. The gallbladder is distended. No definite gallstones are seen on CT. A 2.0 cm cystic lesion within the body of the pancreas is grossly unchanged from the prior study. The pancreas is otherwise unremarkable. The adrenal glands are within normal limits.  A few tiny bilateral renal cysts are seen; the kidneys are otherwise unremarkable in appearance. There is no evidence of hydronephrosis. No renal or ureteral stones are identified. No perinephric stranding is appreciated.  No free fluid is identified. The small bowel is unremarkable in appearance. The stomach is within  normal limits. No acute vascular abnormalities are seen. Scattered calcification is noted along the abdominal aorta and its branches.  The appendix is not definitely characterized; there is no evidence for appendicitis. The colon is largely filled with stool, raising concern for mild constipation.  The bladder is mildly distended and grossly unremarkable in appearance, though partially compressed by the patient's ascites. The uterus demonstrates new heterogeneity and multiple prominent areas of cystic change, raising question for endometrial carcinoma given the abdominal findings. The ovaries are not well assessed. No inguinal lymphadenopathy is seen.  No acute osseous abnormalities are identified. Right hip hardware is incompletely imaged but appears grossly unremarkable. There is chronic compression deformity involving the superior endplate of vertebral body T11.  IMPRESSION: 1. New moderate to large volume  ascites throughout the abdomen and pelvis. 2. Diffuse infiltration of the omentum at the left upper quadrant and left mid abdomen, with associated nodularity, suspicious for omental caking. Heterogeneous soft tissue thickening with regard to the inferior aspect of the ascites within the pelvis, raising concern for dependent metastases. 3. The primary source of the malignancy is not definitely seen, though there is new heterogeneity and multiple prominent areas of cystic change within the uterus, which could reflect endometrial carcinoma. Diagnostic paracentesis could be considered for further evaluation, as deemed clinically appropriate. 4. Mild nonspecific prominence of the intrahepatic biliary ducts, with gallbladder distention. No definite stones identified on CT. 5. Relatively stable 2.0 cm cystic lesion within the body of the pancreas. 6. Few tiny bilateral renal cysts seen. 7. Scattered calcification along the abdominal aorta and its branches. 8. Scattered coronary artery calcifications seen. 9. Colon  largely filled with stool, raising concern for mild constipation.   Electronically Signed   By: Roanna Raider M.D.   On: 06/18/2013 07:16   US Paracentesis  06/18/2013   CLINICAL DATA:  Patient with history of mucinous cystic neoplasm of the pancreas, cystic changes within the uterus, omental nodularity, ascites. Request is made for diagnostic and therapeutic paracentesis up to 2 liters.  EXAM: ULTRASOUND GUIDED DIAGNOSTIC AND THERAPEUTIC PARACENTESIS  COMPARISON:  None.  PROCEDURE: An ultrasound guided paracentesis was thoroughly discussed with the patient and questions answered. The benefits, risks, alternatives and complications were also discussed. The patient understands and wishes to proceed with the procedure. Written consent was obtained.  Ultrasound was performed to localize and mark an adequate pocket of fluid in the left lower. quadrant of the abdomen. The area was then prepped and draped in the normal sterile fashion. 1% Lidocaine was used for local anesthesia. Under ultrasound guidance a 19 gauge Yueh catheter was introduced. Paracentesis was performed. The catheter was removed and a dressing applied.  COMPLICATIONS: None immediate  FINDINGS: A total of approximately 1.6 liters. of slightly turbid, yellow fluid was removed. A fluid sample was sent for laboratory analysis.  IMPRESSION: Successful ultrasound guided diagnostic and therapeutic paracentesis yielding 1.6 liters of ascites.  Read by: Jeananne Rama ,P.A.-C.   Electronically Signed   By: Simonne Come M.D.   On: 06/18/2013 16:39    Scheduled Meds: . aspirin EC  81 mg Oral Daily  . ciprofloxacin  400 mg Intravenous Q12H  . docusate sodium  100 mg Oral BID  . feeding supplement (PRO-STAT SUGAR FREE 64)  30 mL Oral BID WC  . heparin  5,000 Units Subcutaneous Q8H  . levothyroxine  25 mcg Oral QAC breakfast  . LORazepam  0.5 mg Oral Q8H  .  morphine injection  4 mg Intravenous Once  . polyethylene glycol  17 g Oral Q3 days  . senna  1  tablet Oral BID   Continuous Infusions: . sodium chloride 75 mL/hr at 06/19/13 1332    Active Problems:   HYPERTENSION   Hypothyroidism   Constipation   Abdominal malignancy   Abdominal pain   Nausea and vomiting   Dehydration   Hyponatremia   Ascites   Pancreatic cyst    Time spent: 20 minutes    Hollice Espy  [facility] Pager [phone]. If 7PM-7AM, please contact night-coverage at [email or web address], password [facility] 06/19/2013, 5:47 PM  LOS: 1 day

## 2013-06-19 NOTE — Progress Notes (Signed)
Clinical Social Work Department BRIEF PSYCHOSOCIAL ASSESSMENT 06/19/2013  Patient:  [name], [name]     Account Number:  [id number]     Admit date:  06/18/2013  Clinical Social Worker:  Orpah Greek  Date/Time:  06/19/2013 12:45 PM  Referred by:  Physician  Date Referred:  06/19/2013 Referred for  Other - See comment   Other Referral:   Admitted from: [facility]   Interview type:  Family Other interview type:   patient's son, [name] via phone    PSYCHOSOCIAL DATA Living Status:  FACILITY Admitted from facility:   Level of care:  Assisted Living Primary support name:  [name] (son) ph#: [phone] Primary support relationship to patient:  CHILD, ADULT Degree of support available:   good    CURRENT CONCERNS Current Concerns  Post-Acute Placement   Other Concerns:    SOCIAL WORK ASSESSMENT / PLAN CSW received consult that patient was admitted from [facility].   Assessment/plan status:  Information/Referral to [facility] Other assessment/ plan:   Information/referral to community resources:   CSW completed FL2 and confirmed with patient's son that he would prefer that she return to [facility]F if possible at discharge.    PATIENT'S/FAMILY'S RESPONSE TO PLAN OF CARE: Patient's son states that she had been to [facility] in the past and if she does need to go to a rehab before returning to ALF, that he would prefer that she go there.       Unice Bailey, LCSW [facility] Clinical Social Worker cell #: [phone]

## 2013-06-20 LAB — BASIC METABOLIC PANEL
BUN: 9 mg/dL (ref 6–23)
CO2: 24 mEq/L (ref 19–32)
Calcium: 8.3 mg/dL — ABNORMAL LOW (ref 8.4–10.5)
Creatinine, Ser: 0.4 mg/dL — ABNORMAL LOW (ref 0.50–1.10)
GFR calc Af Amer: >90 mL/min (ref >90)
GFR calc non Af Amer: >90 mL/min (ref >90)
Glucose, Bld: 89 mg/dL (ref 70–99)
Potassium: 3.8 mEq/L (ref 3.5–5.1)

## 2013-06-20 MED ORDER — FLEET ENEMA 7-19 GM/118ML RE ENEM
1.0000 | ENEMA | Freq: Once | RECTAL | Status: AC
  Administered 2013-06-20: 1 via RECTAL
  Filled 2013-06-20: qty 1

## 2013-06-20 MED ORDER — ONDANSETRON HCL 4 MG/2ML IJ SOLN
4.0000 mg | Freq: Four times a day (QID) | INTRAMUSCULAR | Status: DC
  Administered 2013-06-20 – 2013-06-21 (×3): 4 mg via INTRAVENOUS
  Filled 2013-06-20 (×3): qty 2

## 2013-06-20 MED ORDER — ZOLPIDEM TARTRATE 5 MG PO TABS: 5.0000 mg | ORAL_TABLET | Freq: Every evening | ORAL | Status: DC | PRN

## 2013-06-20 MED ORDER — OXYCODONE HCL ER 10 MG PO T12A
10.0000 mg | EXTENDED_RELEASE_TABLET | Freq: Two times a day (BID) | ORAL | Status: DC
  Administered 2013-06-20 – 2013-06-21 (×2): 10 mg via ORAL
  Filled 2013-06-20 (×3): qty 1

## 2013-06-20 NOTE — Progress Notes (Signed)
06/20/13 1700 Enema done. Not successful. No BM.

## 2013-06-20 NOTE — Progress Notes (Signed)
IP PROGRESS NOTE  Subjective:   She complains of pain in the left leg. Does not wish to be woken up.  Objective: Vital signs in last 24 hours: Blood pressure 116/65, pulse 78, temperature 98.2 F (36.8 C), temperature source Oral, resp. rate 20, height 5\' 3"  (1.6 m), weight 128 lb 11.2 oz (58.378 kg), SpO2 95.00%.  Intake/Output from previous day: 12/24 0701 - 12/25 0700 In: 2485 [P.O.:360; I.V.:1725; IV Piggyback:400] Out: -   Physical Exam:   Abdomen: Distended Extremities: Left leg without edema or erythema    Lab Results:  Recent Labs  06/18/13 0520 06/19/13 0730  WBC 7.9 5.7  HGB 12.1 11.4*  HCT 35.2* 33.3*  PLT 441* 321    BMET  Recent Labs  06/19/13 0730 06/20/13 0610  NA 129* 130*  K 3.4* 3.8  CL 96 98  CO2 24 24  GLUCOSE 83 89  BUN 9 9  CREATININE 0.39* 0.40*  CALCIUM 8.2* 8.3*    Studies/Results: US Paracentesis  06/18/2013   CLINICAL DATA:  Patient with history of mucinous cystic neoplasm of the pancreas, cystic changes within the uterus, omental nodularity, ascites. Request is made for diagnostic and therapeutic paracentesis up to 2 liters.  EXAM: ULTRASOUND GUIDED DIAGNOSTIC AND THERAPEUTIC PARACENTESIS  COMPARISON:  None.  PROCEDURE: An ultrasound guided paracentesis was thoroughly discussed with the patient and questions answered. The benefits, risks, alternatives and complications were also discussed. The patient understands and wishes to proceed with the procedure. Written consent was obtained.  Ultrasound was performed to localize and mark an adequate pocket of fluid in the left lower. quadrant of the abdomen. The area was then prepped and draped in the normal sterile fashion. 1% Lidocaine was used for local anesthesia. Under ultrasound guidance a 19 gauge Yueh catheter was introduced. Paracentesis was performed. The catheter was removed and a dressing applied.  COMPLICATIONS: None immediate  FINDINGS: A total of approximately 1.6 liters. of  slightly turbid, yellow fluid was removed. A fluid sample was sent for laboratory analysis.  IMPRESSION: Successful ultrasound guided diagnostic and therapeutic paracentesis yielding 1.6 liters of ascites.  Read by: Jeananne Rama ,P.A.-C.   Electronically Signed   By: Simonne Come M.D.   On: 06/18/2013 16:39    Medications: I have reviewed the patient's current medications.  Assessment/Plan:  1. 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 Cystic changes of the uterus  3. Cystic pancreas lesion-stable on the CT 06/18/2013, status post an FNA biopsy 01/09/2013 with cells suspicious of a mucinous neoplasm  4. History of psychosis  5. Right femur fracture September 2014, status post ORIF on 03/19/2013  6. Rheumatoid arthritis  The primary tumor site has not been defined. I suspect she has a GYN or GI primary. She does not appear to be a candidate for systemic therapy. No family has been present when I have seen her each of the past 3 days.  Recommendations: 1. [facility] hospice referral for care at the skilled nursing facility 2. Followup peritoneal fluid cytology, I will be glad to discuss the diagnosis and treatment options with her family.   LOS: 2 days   Avriel Kandel, Jillyn Hidden  06/20/2013, 7:44 AM

## 2013-06-20 NOTE — Progress Notes (Signed)
[facility] PROGRESS NOTE  [name] XBM:[id number] DOB: 08/04/1929 DOA: 06/18/2013 PCP: No primary provider on file.  Assessment/Plan: Active Problems:   HYPERTENSION   Hypothyroidism: Continue Synthroid    Constipation: Started on Fleet enemas   Abdominal malignancy: Given elevated CA-125, highly suspicious for a GYN malignancy. Awaiting pathology, however prognosis and treatment options are very limited. Had extensive meeting with patient's sons and daughter-in-law's today as well as discuss prognosis with patient herself. Family understands prognosis and wants patient to be comfortable. There've been to hospice before with patient's daughter. Will get palliative care involved to set up for goals of care and arrange for hospice at assisted living facility. Discuss CODE STATUS and patient is now partial code. Still awaiting final pathology   Abdominal pain: Secondary to ascites and malignancy   Nausea and vomiting: Likely secondary to constipation plus malignant ascites, I put her on scheduled Zofran   Dehydration: Likely from nausea vomiting   Hyponatremia: Improved  Ascites: Likely secondary to malignancy, status post therapeutic/diagnostic paracentesis   Pancreatic cyst Leg pain: Mostly of left lower leg and possibly neuropathic. Patient's previous history of compression fracture. We'll start with every 12 hours extended release OxyContin.  Code Status: Full code Family Communication: Met with patient's 2 sons and they're wise today Disposition Plan: Limited prognosis given presumed diagnosis, likely will go back to assisted living with hospice   Consultants:  Interventional radiology  Oncology  Gastroenterology-[place]  Procedures:  Status post ultrasound-guided paracentesis for diagnosis/therapeutic yielding 1.6 L  Antibiotics:  IV Cipro for SBP prophylaxis 12/24-12/26   HPI/Subjective: Patient complains of chronic nausea as well as continuous left leg  pain  Objective: Filed Vitals:   06/20/13 0530  BP: 116/65  Pulse: 78  Temp: 98.2 F (36.8 C)  Resp: 20    Intake/Output Summary (Last 24 hours) at 06/20/13 1403 Last data filed at 06/20/13 1303  Gross per 24 hour  Intake   2240 ml  Output      0 ml  Net   2240 ml   Filed Weights   06/18/13 0935  Weight: 58.378 kg (128 lb 11.2 oz)    Exam:   General:  Fatigue, no acute distress  Cardiovascular: Regular rate and rhythm, S1-S2  Respiratory: Clear to auscultation bilaterally and  Abdomen: Soft, nondistended, nontender?, Hypoactive bowel sounds  Musculoskeletal: No clubbing or cyanosis, trace edema. Chronic pain of the left lower extremity all the way down to the foot starting at the hip   Data Reviewed: Basic Metabolic Panel:  Recent Labs Lab 06/18/13 0520 06/19/13 0730 06/20/13 0610  NA 128* 129* 130*  K 3.8 3.4* 3.8  CL 90* 96 98  CO2 25 24 24   GLUCOSE 109* 83 89  BUN 14 9 9   CREATININE 0.43* 0.39* 0.40*  CALCIUM 9.0 8.2* 8.3*   Liver Function Tests:  Recent Labs Lab 06/18/13 0520 06/19/13 0730  AST 13 12  ALT <5 <5  ALKPHOS 70 53  BILITOT 0.4 0.3  PROT 7.0 5.2*  ALBUMIN 2.5* 1.8*    Recent Labs Lab 06/18/13 0520  LIPASE 9*   No results found for this basename: AMMONIA,  in the last 168 hours CBC:  Recent Labs Lab 06/18/13 0520 06/19/13 0730  WBC 7.9 5.7  NEUTROABS 6.5  --   HGB 12.1 11.4*  HCT 35.2* 33.3*  MCV 84.4 84.3  PLT 441* 321   Cardiac Enzymes: No results found for this basename: CKTOTAL, CKMB, CKMBINDEX, TROPONINI,  in the last  168 hours BNP (last 3 results)  Recent Labs  06/18/13 0520  PROBNP 340.1   CBG: No results found for this basename: GLUCAP,  in the last 168 hours  Recent Results (from the past 240 hour(s))  MRSA PCR SCREENING     Status: None   Collection Time    06/18/13 10:06 AM      Result Value Range Status   MRSA by PCR NEGATIVE  NEGATIVE Final   Comment:            The GeneXpert MRSA  Assay (FDA     approved for NASAL specimens     only), is one component of a     comprehensive MRSA colonization     surveillance program. It is not     intended to diagnose MRSA     infection nor to guide or     monitor treatment for     MRSA infections.  BODY FLUID CULTURE     Status: None   Collection Time    06/18/13  4:20 PM      Result Value Range Status   Specimen Description ASCITIC   Final   Special Requests Normal   Final   Gram Stain     Final   Value: RARE WBC PRESENT,BOTH PMN AND MONONUCLEAR     NO ORGANISMS SEEN     Performed at [facility]   Culture     Final   Value: NO GROWTH 2 DAYS     Performed at [facility]   Report Status PENDING   Incomplete     Studies: US Paracentesis  06/18/2013   CLINICAL DATA:  Patient with history of mucinous cystic neoplasm of the pancreas, cystic changes within the uterus, omental nodularity, ascites. Request is made for diagnostic and therapeutic paracentesis up to 2 liters.  EXAM: ULTRASOUND GUIDED DIAGNOSTIC AND THERAPEUTIC PARACENTESIS  COMPARISON:  None.  PROCEDURE: An ultrasound guided paracentesis was thoroughly discussed with the patient and questions answered. The benefits, risks, alternatives and complications were also discussed. The patient understands and wishes to proceed with the procedure. Written consent was obtained.  Ultrasound was performed to localize and mark an adequate pocket of fluid in the left lower. quadrant of the abdomen. The area was then prepped and draped in the normal sterile fashion. 1% Lidocaine was used for local anesthesia. Under ultrasound guidance a 19 gauge Yueh catheter was introduced. Paracentesis was performed. The catheter was removed and a dressing applied.  COMPLICATIONS: None immediate  FINDINGS: A total of approximately 1.6 liters. of slightly turbid, yellow fluid was removed. A fluid sample was sent for laboratory analysis.  IMPRESSION: Successful ultrasound guided  diagnostic and therapeutic paracentesis yielding 1.6 liters of ascites.  Read by: Jeananne Rama ,P.A.-C.   Electronically Signed   By: Simonne Come M.D.   On: 06/18/2013 16:39    Scheduled Meds: . aspirin EC  81 mg Oral Daily  . ciprofloxacin  400 mg Intravenous Q12H  . docusate sodium  100 mg Oral BID  . feeding supplement (PRO-STAT SUGAR FREE 64)  30 mL Oral BID WC  . heparin  5,000 Units Subcutaneous Q8H  . levothyroxine  25 mcg Oral QAC breakfast  . LORazepam  0.5 mg Oral Q8H  .  morphine injection  4 mg Intravenous Once  . ondansetron (ZOFRAN) IV  4 mg Intravenous Q6H  . OxyCODONE  10 mg Oral Q12H  . polyethylene glycol  17 g Oral Q3 days  .  senna  1 tablet Oral BID  . sodium phosphate  1 enema Rectal Once   Continuous Infusions: . sodium chloride 75 mL/hr at 06/20/13 1303    Active Problems:   HYPERTENSION   Hypothyroidism   Constipation   Abdominal malignancy   Abdominal pain   Nausea and vomiting   Dehydration   Hyponatremia   Ascites   Pancreatic cyst    Time spent: 35 minutes    Hollice Espy  [facility] Pager [phone]. If 7PM-7AM, please contact night-coverage at [email or web address], password [email or web address] 06/20/2013, 2:03 PM  LOS: 2 days

## 2013-06-20 NOTE — Progress Notes (Signed)
Transfer to Rm [id number], report given to [facility] Rn.patient no complained of any discomfort during transfer

## 2013-06-20 NOTE — Progress Notes (Signed)
Patient [name]      DOB: October 19, 1929      GNF:[id number]  Consult for goals of care received.  No family at the beside per nurse.  Call numbers on face sheet for sons no answer.  Will attempt again later.  I have asked the nurses to gather phone numbers if family returns.  Tanuj Mullens L. Ladona Ridgel, MD MBA The Palliative Medicine Team at [facility] Phone: [phone] Pager: [phone]

## 2013-06-21 MED ORDER — SODIUM CHLORIDE 0.9 % IJ SOLN
INTRAMUSCULAR | Status: AC
  Filled 2013-06-21: qty 10

## 2013-06-21 MED ORDER — TRAMADOL HCL 50 MG PO TABS: 100.0000 mg | ORAL_TABLET | Freq: Four times a day (QID) | ORAL | Status: DC | PRN

## 2013-06-21 MED ORDER — ONDANSETRON 8 MG/NS 50 ML IVPB
8.0000 mg | Freq: Four times a day (QID) | INTRAVENOUS | Status: DC
  Administered 2013-06-21 – 2013-06-26 (×20): 8 mg via INTRAVENOUS
  Filled 2013-06-21 (×23): qty 8

## 2013-06-21 MED ORDER — PROMETHAZINE HCL 25 MG/ML IJ SOLN
12.5000 mg | Freq: Four times a day (QID) | INTRAMUSCULAR | Status: DC | PRN
  Administered 2013-06-21: 12.5 mg via INTRAVENOUS
  Administered 2013-06-22: 6.25 mg via INTRAVENOUS
  Administered 2013-06-23: 12.5 mg via INTRAVENOUS
  Filled 2013-06-21 (×3): qty 1

## 2013-06-21 NOTE — Progress Notes (Signed)
Patient ZO:[id number] HARRIET SUTPHEN      DOB: 1929-10-19      UJW:[id number]  Able to reach son Channing Mutters who will be checking with his brother regarding times to meet .  First available opening is Saturday 12/ 27.  Roy to get back with a time.  Elfreida Heggs L. Ladona Ridgel, MD MBA The Palliative Medicine Team at [facility] Phone: [phone] Pager: [phone]

## 2013-06-21 NOTE — Progress Notes (Signed)
Thank you for consulting the Palliative Medicine Team at [facility] to meet your patient's and family's needs.   The reason that you asked [place] to see your patient is  For GOC  We have scheduled your patient for a meeting: 12/27 1230 pm  The Surrogate decision make is: Sons Channing Mutters and [name] Contact information: [phone]  Other family members that need to be present:     Your patient is able/unable to participate:  Additional Narrative:   Tina Ellender L. Ladona Ridgel, MD MBA The Palliative Medicine Team at [facility] Phone: [phone] Pager: [phone]

## 2013-06-21 NOTE — Progress Notes (Signed)
[facility] PROGRESS NOTE  [name] HQI:[id number] DOB: 1929-11-17 DOA: 06/18/2013 PCP: No primary provider on file.  Assessment/Plan: Active Problems:   HYPERTENSION   Hypothyroidism: Continue Synthroid    Constipation: Started on Fleet enemas   Abdominal malignancy: Given elevated CA-125, highly suspicious for a GYN malignancy. Awaiting pathology, however prognosis and treatment options are very limited. Had extensive meeting with patient's sons and daughter-in-law's today as well as discuss prognosis with patient herself. Family understands prognosis and wants patient to be comfortable. There've been to hospice before with patient's daughter. Will get palliative care involved to set up for goals of care tomorrow morning and arrange for hospice at assisted living facility. Discuss CODE STATUS and patient is now partial code. Still awaiting final pathology    Abdominal pain: Secondary to ascites and malignancy   Nausea and vomiting: Likely secondary to constipation plus malignant ascites, I put her on scheduled Zofran and added Phenergan today   Dehydration: Likely from nausea vomiting    Hyponatremia: Improved  Ascites: Likely secondary to malignancy, status post therapeutic/diagnostic paracentesis    Pancreatic cyst: Incidental, CA 19-9 level normal  Leg pain: Mostly of left lower leg and possibly neuropathic. Noted Patient's previous history of compression fracture. We'll try Ultram instead of OxyContin given concerns of OxyContin may be contributing to her nausea  Code Status: Full code Family Communication: Met with patient's 2 sons and their  Wives today Disposition Plan: Limited prognosis given presumed diagnosis, likely will go back to assisted living with hospice   Consultants:  Interventional radiology  Oncology  Gastroenterology-[place]  Procedures:  Status post ultrasound-guided paracentesis for diagnosis/therapeutic yielding 1.6  L  Antibiotics:  IV Cipro for SBP prophylaxis 12/24-12/26   HPI/Subjective: Patient feeling a little bit better. Tired. Denies any acute nausea after Phenergan given.  Objective: Filed Vitals:   06/21/13 1344  BP: 134/83  Pulse: 78  Temp: 98.3 F (36.8 C)  Resp: 16    Intake/Output Summary (Last 24 hours) at 06/21/13 1457 Last data filed at 06/21/13 0600  Gross per 24 hour  Intake 1271.25 ml  Output    100 ml  Net 1171.25 ml   Filed Weights   06/18/13 0935  Weight: 58.378 kg (128 lb 11.2 oz)    Exam:   General:  Somnolent  Cardiovascular: Regular rate and rhythm, S1-S2  Respiratory: Clear to auscultation bilaterally and  Abdomen: Soft, nondistended, nontender?, Hypoactive bowel sounds  Musculoskeletal: No clubbing or cyanosis, trace edema. Chronic pain of the left lower extremity all the way down to the foot starting at the hip   Data Reviewed: Basic Metabolic Panel:  Recent Labs Lab 06/18/13 0520 06/19/13 0730 06/20/13 0610  NA 128* 129* 130*  K 3.8 3.4* 3.8  CL 90* 96 98  CO2 25 24 24   GLUCOSE 109* 83 89  BUN 14 9 9   CREATININE 0.43* 0.39* 0.40*  CALCIUM 9.0 8.2* 8.3*   Liver Function Tests:  Recent Labs Lab 06/18/13 0520 06/19/13 0730  AST 13 12  ALT <5 <5  ALKPHOS 70 53  BILITOT 0.4 0.3  PROT 7.0 5.2*  ALBUMIN 2.5* 1.8*    Recent Labs Lab 06/18/13 0520  LIPASE 9*   No results found for this basename: AMMONIA,  in the last 168 hours CBC:  Recent Labs Lab 06/18/13 0520 06/19/13 0730  WBC 7.9 5.7  NEUTROABS 6.5  --   HGB 12.1 11.4*  HCT 35.2* 33.3*  MCV 84.4 84.3  PLT 441* 321  Cardiac Enzymes: No results found for this basename: CKTOTAL, CKMB, CKMBINDEX, TROPONINI,  in the last 168 hours BNP (last 3 results)  Recent Labs  06/18/13 0520  PROBNP 340.1   CBG: No results found for this basename: GLUCAP,  in the last 168 hours  Recent Results (from the past 240 hour(s))  MRSA PCR SCREENING     Status: None    Collection Time    06/18/13 10:06 AM      Result Value Range Status   MRSA by PCR NEGATIVE  NEGATIVE Final   Comment:            The GeneXpert MRSA Assay (FDA     approved for NASAL specimens     only), is one component of a     comprehensive MRSA colonization     surveillance program. It is not     intended to diagnose MRSA     infection nor to guide or     monitor treatment for     MRSA infections.  BODY FLUID CULTURE     Status: None   Collection Time    06/18/13  4:20 PM      Result Value Range Status   Specimen Description ASCITIC   Final   Special Requests Normal   Final   Gram Stain     Final   Value: RARE WBC PRESENT,BOTH PMN AND MONONUCLEAR     NO ORGANISMS SEEN     Performed at [facility]   Culture     Final   Value: NO GROWTH 3 DAYS     Performed at [facility]   Report Status PENDING   Incomplete     Studies: No results found.  Scheduled Meds: . aspirin EC  81 mg Oral Daily  . docusate sodium  100 mg Oral BID  . feeding supplement (PRO-STAT SUGAR FREE 64)  30 mL Oral BID WC  . heparin  5,000 Units Subcutaneous Q8H  . levothyroxine  25 mcg Oral QAC breakfast  . LORazepam  0.5 mg Oral Q8H  .  morphine injection  4 mg Intravenous Once  . ondansetron (ZOFRAN) IV  8 mg Intravenous Q6H  . polyethylene glycol  17 g Oral Q3 days  . senna  1 tablet Oral BID  . sodium chloride       Continuous Infusions: . sodium chloride 75 mL/hr (06/21/13 1242)    Active Problems:   HYPERTENSION   Hypothyroidism   Constipation   Abdominal malignancy   Abdominal pain   Nausea and vomiting   Dehydration   Hyponatremia   Ascites   Pancreatic cyst    Time spent: 20 minutes    Hollice Espy  [facility] Pager [phone]. If 7PM-7AM, please contact night-coverage at [email or web address], password [email or web address] 06/21/2013, 2:57 PM  LOS: 3 days

## 2013-06-21 NOTE — Progress Notes (Signed)
Chart review complete.  Patient is not eligible for [facility] Care Management services because his/her PCP is not a [facility] primary care provider or is not [facility] affiliated.  For any additional questions or new referrals please contact Tim Henderson BSN RN [facility] Liaison at [phone] °

## 2013-06-21 NOTE — Progress Notes (Signed)
CSW following to assist with d/c planning. PN reviewed. Palliative Care following/meeting on 12/27. [employer] contacted and updated. CSW will review PN following Palliaitve Care meeting to continue assisting with d/c planning.   Cori Razor LCSW [phone]

## 2013-06-21 NOTE — Progress Notes (Signed)
Physical Therapy Treatment Patient Details Name: KINDRA BICKHAM MRN: [id number] DOB: February 23, 1930 Today's Date: 06/21/2013 Time: 1478-2956 PT Time Calculation (min): 30 min  PT Assessment / Plan / Recommendation  History of Present Illness 79 yo female admitted from SNF with abdominal malignancy. Paraentesis on 12/23. Hx o RA, HTN, psychosis, fall, R femur ORIF 02/2013   PT Comments   Pt alert in bed reported she was cold and asked me to "turn up the heat".  Assisted pt OOB to [facility] per nursing request as pt was over due for a BM.  Pt required + 2 assist and demon limited self activity tolerance and ability.  Pt sat on BSC a good 15 min before assisting her back to bed.   Follow Up Recommendations  SNF     Does the patient have the potential to tolerate intense rehabilitation     Barriers to Discharge        Equipment Recommendations       Recommendations for Other Services    Frequency Min 2X/week   Progress towards PT Goals Progress towards PT goals: Progressing toward goals  Plan      Precautions / Restrictions Precautions Precautions: Fall Precaution Comments: B hand arthritis/deformities Restrictions Weight Bearing Restrictions: No RLE Weight Bearing: Weight bearing as tolerated    Pertinent Vitals/Pain No c/o pain    Mobility  Bed Mobility Bed Mobility: Supine to Sit;Sit to Supine Supine to Sit: 1: +2 Total assist Supine to Sit: Patient Percentage: 10% Sit to Supine: 1: +2 Total assist Sit to Supine: Patient Percentage: 0% Details for Bed Mobility Assistance: Max encouragement for pt participation. Assist for trunk and bil LEs. Utilized bedpad for scooting, positioning.  Transfers Transfers: Pharmacologist Details for Transfer Assistance: "[employer]" stand pivot sit from bed to [facility] with very limited self assist and B LE buckling.  Same transfer tech from [facility] to bed. Ambulation/Gait Ambulation/Gait Assistance Details: unable to attempt due to low  performance during tranfer     PT Goals (current goals can now be found in the care plan section)    Visit Information  Last PT Received On: 06/21/13 Assistance Needed: +2 History of Present Illness: 79 yo female admitted from SNF with abdominal malignancy. Paraentesis on 12/23. Hx o RA, HTN, psychosis, fall, R femur ORIF 02/2013    Subjective Data      Cognition       Balance     End of Session PT - End of Session Equipment Utilized During Treatment: Gait belt Patient left: in bed;with call bell/phone within reach;with bed alarm set   Felecia Shelling  PTA [facility]  Acute  Rehab Pager      [phone]

## 2013-06-22 DIAGNOSIS — E039 Hypothyroidism, unspecified: Secondary | ICD-10-CM

## 2013-06-22 LAB — BODY FLUID CULTURE
Culture: NO GROWTH
Special Requests: NORMAL

## 2013-06-22 MED ORDER — LIDOCAINE 5 % EX PTCH
1.0000 | MEDICATED_PATCH | CUTANEOUS | Status: DC
  Administered 2013-06-22 – 2013-06-25 (×4): 1 via TRANSDERMAL
  Filled 2013-06-22 (×5): qty 1

## 2013-06-22 MED ORDER — TRAMADOL HCL 50 MG PO TABS
50.0000 mg | ORAL_TABLET | Freq: Four times a day (QID) | ORAL | Status: DC | PRN
  Administered 2013-06-24 – 2013-06-26 (×6): 50 mg via ORAL
  Filled 2013-06-22 (×6): qty 1

## 2013-06-22 MED ORDER — SODIUM CHLORIDE 0.9 % IJ SOLN
INTRAMUSCULAR | Status: AC
  Filled 2013-06-22: qty 10

## 2013-06-22 NOTE — Consult Note (Signed)
Patient [name]      DOB: 09-12-1929      UJW:[id number]   Summary for goals of care/full note to follow:  Met with two sons [name] and Channing Mutters and patient and Lacie Scotts  Patient coherent related to everyday things but has difficulty abstracting and connecting up consequences to current state.  Her faith is very important to her and so most of her response will be from a faith base.  She stated that she needs me to help with her nausea and the pain in her left leg.  She states her only wish is to walk on God's green earth one more time.   I am not sure she has full capacity or choice , although her sons are trying to rely on her thoughts to make decisions.  I empowered them with information about her illness , CPR, and hospice options it turns out that she helped care for her daughter who had cancer under hospice care in [place] and per her sons did not have a good "taste for hospice".  They have never challenged her feelings regarding this. The patient received IV nausea medicine and started to fall asleep during the last part of her visit.  We will address her code status when she is more awake and alert .  Family updated on use of feeding tubes for purposes of venting in such cases even if we don't feed.  Leg pain sounds radicular which will be hard to treat.  Can try lidocaine patch on hip but won't correct radiating pain. Doubt imaging would gain [place] much help but could be considered.   Recommend:  Respect previous code recommendations. Will try to talk with her again Slowly introduce possible hospice help Decrease Tramadol to 50 mg , stop oxycodone Add lidocaine patch Caution with ambien at her age Constipation: agree with senna, colace, consider miralax to keep daily stooling. Schedule zofran and use phenergan prn    Total time 145- 215Pm  Thomson Herbers L. Ladona Ridgel, MD MBA The Palliative Medicine Team at [facility] Phone: [phone] Pager: [phone]

## 2013-06-22 NOTE — Progress Notes (Signed)
[facility] PROGRESS NOTE  [name] YNW:[id number] DOB: Jul 04, 1929 DOA: 06/18/2013 PCP: No primary provider on file.  Assessment/Plan: Active Problems:   HYPERTENSION: Stable   Hypothyroidism: Continue Synthroid    Constipation: Started on Fleet enemas, has had very large bowel movement.   Abdominal malignancy: Given elevated CA-125, highly suspicious for a GYN malignancy. Awaiting pathology, however prognosis and treatment options are very limited. Had extensive meeting with patient's sons and daughter-in-law's today as well as discuss prognosis with patient herself. Family understands prognosis and wants patient to be comfortable. There've been to hospice before with patient's daughter. Will get palliative care involved to set up for goals of care tomorrow morning and arrange for hospice at assisted living facility. Discuss CODE STATUS and patient is now partial code. Still awaiting final pathology    Abdominal pain: Secondary to ascites and malignancy    Nausea and vomiting: Likely secondary to constipation plus malignant ascites, I put her on scheduled Zofran and added Phenergan. Nausea persisting    Dehydration: Likely from nausea vomiting    Hyponatremia: Improved    Ascites: Likely secondary to malignancy, status post therapeutic/diagnostic paracentesis    Pancreatic cyst: Incidental, CA 19-9 level normal  Leg pain: Mostly of left lower leg and possibly neuropathic. Noted Patient's previous history of compression fracture. We'll try Ultram instead of OxyContin given concerns of OxyContin may be contributing to her nausea, this seems to help a little  Code Status: Partial code  Family Communication: Met with family yesterday  Disposition Plan: Awaiting for followup from palliative medicine  Consultants:  Interventional radiology  Oncology  Gastroenterology-[place]  Procedures:  Status post ultrasound-guided paracentesis for diagnosis/therapeutic  yielding 1.6 L  Antibiotics:  IV Cipro for SBP prophylaxis 12/24-12/26  HPI/Subjective: Somnolent  Objective: Filed Vitals:   06/22/13 0600  BP: 120/79  Pulse: 76  Temp: 98 F (36.7 C)  Resp: 17    Intake/Output Summary (Last 24 hours) at 06/22/13 1322 Last data filed at 06/22/13 6578  Gross per 24 hour  Intake 1953.75 ml  Output      4 ml  Net 1949.75 ml   Filed Weights   06/18/13 0935  Weight: 58.378 kg (128 lb 11.2 oz)    Exam:   General:  Somnolent  Cardiovascular: Regular rate and rhythm, S1-S2  Respiratory: Clear to auscultation bilaterally and  Abdomen: Soft, nondistended, nontender?, Hypoactive bowel sounds  Musculoskeletal: No clubbing or cyanosis, trace edema. Chronic pain of the left lower extremity all the way down to the foot starting at the hip   Data Reviewed: Basic Metabolic Panel:  Recent Labs Lab 06/18/13 0520 06/19/13 0730 06/20/13 0610  NA 128* 129* 130*  K 3.8 3.4* 3.8  CL 90* 96 98  CO2 25 24 24   GLUCOSE 109* 83 89  BUN 14 9 9   CREATININE 0.43* 0.39* 0.40*  CALCIUM 9.0 8.2* 8.3*   Liver Function Tests:  Recent Labs Lab 06/18/13 0520 06/19/13 0730  AST 13 12  ALT <5 <5  ALKPHOS 70 53  BILITOT 0.4 0.3  PROT 7.0 5.2*  ALBUMIN 2.5* 1.8*    Recent Labs Lab 06/18/13 0520  LIPASE 9*   No results found for this basename: AMMONIA,  in the last 168 hours CBC:  Recent Labs Lab 06/18/13 0520 06/19/13 0730  WBC 7.9 5.7  NEUTROABS 6.5  --   HGB 12.1 11.4*  HCT 35.2* 33.3*  MCV 84.4 84.3  PLT 441* 321   Cardiac Enzymes: No results found for  this basename: CKTOTAL, CKMB, CKMBINDEX, TROPONINI,  in the last 168 hours BNP (last 3 results)  Recent Labs  06/18/13 0520  PROBNP 340.1   CBG: No results found for this basename: GLUCAP,  in the last 168 hours  Recent Results (from the past 240 hour(s))  MRSA PCR SCREENING     Status: None   Collection Time    06/18/13 10:06 AM      Result Value Range Status    MRSA by PCR NEGATIVE  NEGATIVE Final   Comment:            The GeneXpert MRSA Assay (FDA     approved for NASAL specimens     only), is one component of a     comprehensive MRSA colonization     surveillance program. It is not     intended to diagnose MRSA     infection nor to guide or     monitor treatment for     MRSA infections.  BODY FLUID CULTURE     Status: None   Collection Time    06/18/13  4:20 PM      Result Value Range Status   Specimen Description ASCITIC   Final   Special Requests Normal   Final   Gram Stain     Final   Value: RARE WBC PRESENT,BOTH PMN AND MONONUCLEAR     NO ORGANISMS SEEN     Performed at [facility]   Culture     Final   Value: NO GROWTH 3 DAYS     Performed at [facility]   Report Status 06/22/2013 FINAL   Final     Studies: No results found.  Scheduled Meds: . aspirin EC  81 mg Oral Daily  . docusate sodium  100 mg Oral BID  . feeding supplement (PRO-STAT SUGAR FREE 64)  30 mL Oral BID WC  . heparin  5,000 Units Subcutaneous Q8H  . levothyroxine  25 mcg Oral QAC breakfast  . LORazepam  0.5 mg Oral Q8H  .  morphine injection  4 mg Intravenous Once  . ondansetron (ZOFRAN) IV  8 mg Intravenous Q6H  . polyethylene glycol  17 g Oral Q3 days  . senna  1 tablet Oral BID   Continuous Infusions: . sodium chloride 75 mL/hr (06/21/13 1242)    Active Problems:   HYPERTENSION   Hypothyroidism   Constipation   Abdominal malignancy   Abdominal pain   Nausea and vomiting   Dehydration   Hyponatremia   Ascites   Pancreatic cyst    Time spent: 15 minutes    Hollice Espy  [facility] Pager [phone]. If 7PM-7AM, please contact night-coverage at [email or web address], password [facility] 06/22/2013, 1:22 PM  LOS: 4 days

## 2013-06-23 MED ORDER — POLYETHYLENE GLYCOL 3350 17 G PO PACK
17.0000 g | PACK | Freq: Every day | ORAL | Status: DC
  Administered 2013-06-23 – 2013-06-26 (×4): 17 g via ORAL
  Filled 2013-06-23 (×4): qty 1

## 2013-06-23 NOTE — Progress Notes (Signed)
[facility] PROGRESS NOTE  ANGELIZE RYCE ZOX:[id number] DOB: 06-03-1930 DOA: 06/18/2013 PCP: No primary provider on file.  Assessment/Plan: Active Problems:   HYPERTENSION: Stable   Hypothyroidism: Continue Synthroid    Constipation: Started on Fleet enemas, has had very large bowel movement. Have added daily MiraLAX.    Abdominal malignancy: Given elevated CA-125, highly suspicious for a GYN malignancy. Awaiting pathology, however prognosis and treatment options are very limited. Had extensive meeting with patient's sons and daughter-in-law's last week as well as discuss prognosis with patient herself. Family understands prognosis and wants patient to be comfortable.  Discuss CODE STATUS and patient is now partial code. Still awaiting final pathology, hopefully back tomorrow.    Abdominal pain: Secondary to ascites and malignancy    Nausea and vomiting: Likely secondary to constipation plus malignant ascites, I put her on scheduled Zofran and added Phenergan. Some Nausea persisting    Dehydration: Likely from nausea vomiting    Hyponatremia: Improved, to be initially from dehydration   Ascites: Likely secondary to malignancy, status post therapeutic/diagnostic paracentesis    Pancreatic cyst: Incidental, CA 19-9 level normal  Leg pain: Mostly of left lower leg and possibly neuropathic. Noted Patient's previous history of compression fracture. Appreciate palliative care help. Narcotics. Try lidocaine patch.  Code Status: Partial code  Family Communication: Met with family yesterday  Disposition Plan: Await further decision making the patient and family on palliative care options, palliative care to follow up with patient  Consultants:  Interventional radiology  Oncology  Gastroenterology-  Procedures:  Status post ultrasound-guided paracentesis for diagnosis/therapeutic yielding 1.6 L  Antibiotics:  IV Cipro for SBP prophylaxis  12/24-12/26  HPI/Subjective: A little bit more awake today. Still some nausea, less pain  Objective: Filed Vitals:   06/23/13 0510  BP: 123/63  Pulse: 81  Temp: 98.2 F (36.8 C)  Resp: 20    Intake/Output Summary (Last 24 hours) at 06/23/13 1237 Last data filed at 06/23/13 0600  Gross per 24 hour  Intake   1760 ml  Output      0 ml  Net   1760 ml   Filed Weights   06/18/13 0935  Weight: 58.378 kg (128 lb 11.2 oz)    Exam:   General:   fatigue, no acute distress  Cardiovascular: Regular rate and rhythm, S1-S2  Respiratory: Clear to auscultation bilaterally and  Abdomen: Soft, nondistended, nontender?, Hypoactive bowel sounds  Musculoskeletal: No clubbing or cyanosis, trace edema. Chronic pain of the left lower extremity all the way down to the foot starting at the hip-improved  Data Reviewed: Basic Metabolic Panel:  Recent Labs Lab 06/18/13 0520 06/19/13 0730 06/20/13 0610  NA 128* 129* 130*  K 3.8 3.4* 3.8  CL 90* 96 98  CO2 25 24 24   GLUCOSE 109* 83 89  BUN 14 9 9   CREATININE 0.43* 0.39* 0.40*  CALCIUM 9.0 8.2* 8.3*   Liver Function Tests:  Recent Labs Lab 06/18/13 0520 06/19/13 0730  AST 13 12  ALT <5 <5  ALKPHOS 70 53  BILITOT 0.4 0.3  PROT 7.0 5.2*  ALBUMIN 2.5* 1.8*    Recent Labs Lab 06/18/13 0520  LIPASE 9*   No results found for this basename: AMMONIA,  in the last 168 hours CBC:  Recent Labs Lab 06/18/13 0520 06/19/13 0730  WBC 7.9 5.7  NEUTROABS 6.5  --   HGB 12.1 11.4*  HCT 35.2* 33.3*  MCV 84.4 84.3  PLT 441* 321   Cardiac Enzymes: No results found  for this basename: CKTOTAL, CKMB, CKMBINDEX, TROPONINI,  in the last 168 hours BNP (last 3 results)  Recent Labs  06/18/13 0520  PROBNP 340.1   CBG: No results found for this basename: GLUCAP,  in the last 168 hours  Recent Results (from the past 240 hour(s))  MRSA PCR SCREENING     Status: None   Collection Time    06/18/13 10:06 AM      Result Value  Range Status   MRSA by PCR NEGATIVE  NEGATIVE Final   Comment:            The GeneXpert MRSA Assay (FDA     approved for NASAL specimens     only), is one component of a     comprehensive MRSA colonization     surveillance program. It is not     intended to diagnose MRSA     infection nor to guide or     monitor treatment for     MRSA infections.  BODY FLUID CULTURE     Status: None   Collection Time    06/18/13  4:20 PM      Result Value Range Status   Specimen Description ASCITIC   Final   Special Requests Normal   Final   Gram Stain     Final   Value: RARE WBC PRESENT,BOTH PMN AND MONONUCLEAR     NO ORGANISMS SEEN     Performed at [facility]   Culture     Final   Value: NO GROWTH 3 DAYS     Performed at [facility]   Report Status 06/22/2013 FINAL   Final     Studies: No results found.  Scheduled Meds: . aspirin EC  81 mg Oral Daily  . docusate sodium  100 mg Oral BID  . feeding supplement (PRO-STAT SUGAR FREE 64)  30 mL Oral BID WC  . heparin  5,000 Units Subcutaneous Q8H  . levothyroxine  25 mcg Oral QAC breakfast  . lidocaine  1 patch Transdermal Q24H  . LORazepam  0.5 mg Oral Q8H  .  morphine injection  4 mg Intravenous Once  . ondansetron (ZOFRAN) IV  8 mg Intravenous Q6H  . polyethylene glycol  17 g Oral Daily  . senna  1 tablet Oral BID   Continuous Infusions: . sodium chloride 75 mL/hr at 06/23/13 1610    Active Problems:   HYPERTENSION   Hypothyroidism   Constipation   Abdominal malignancy   Abdominal pain   Nausea and vomiting   Dehydration   Hyponatremia   Ascites   Pancreatic cyst    Time spent: 15 minutes    Hollice Espy  [facility] Pager [phone]. If 7PM-7AM, please contact night-coverage at [email or web address], password [facility] 06/23/2013, 12:37 PM  LOS: 5 days

## 2013-06-24 NOTE — Progress Notes (Addendum)
Patient [id number] [name]      DOB: 1930-04-26      UJW:[id number]  Met with patient and sons.  Patient calm enough to talk about delicate topics. In conjuction with her sons she has completed a MOST indicating that she wants to NOT be intubated but does want CPR, Antibiotics, and feeding tube to sustain her life.  We reviewed that her symptom management would be best controlled as follows for now: zofran sublingual 4 mg q 6 hours schedule, phenergan 12.5 mg-25 mg q 6 hrs prn.  Lidocaine 2% patch to left hip.  The patient's son's are going to have hospice follow her at the facility.  She is agreeable to have additional people helping with her symptom management .  I have discussed the possibility of a venting gastrostomy if she become more obstructed.  I suspect that this patient will be at high risk for return to hospital because of her lack of capacity for decision making (sons act in conjunction with her but won't generally over rule her, we have discussed the fact that she lacks full capacity).  Updated patient and sons on patholgy reports showing high grade gyn malignancy  Total time: 1200-1245 Greater than 50% of time was spent counseling and coordinating care.  Brand Siever L. Ladona Ridgel, MD MBA The Palliative Medicine Team at [facility] Phone: [phone] Pager: [phone]

## 2013-06-24 NOTE — Progress Notes (Signed)
[facility] PROGRESS NOTE  [name] NWG:[id number] DOB: 1930/01/30 DOA: 06/18/2013 PCP: No primary provider on file.  Assessment/Plan: Active Problems:   HYPERTENSION: Stable   Hypothyroidism: Continue Synthroid    Constipation: Started on Fleet enemas, has had very large bowel movement. Have added daily MiraLAX which seems to be helping    Abdominal malignancy: Given elevated CA-125, highly suspicious for a GYN malignancy. Awaiting pathology, however prognosis and treatment options are very limited. Had extensive meeting with patient's sons and daughter-in-law's last week as well as discuss prognosis with patient herself. Family understands prognosis and plan is for hospice. Discuss CODE STATUS and patient is now partial code. Still awaiting final pathology, hopefully back soon    Abdominal pain: Secondary to ascites and malignancy    Nausea and vomiting: Likely secondary to constipation plus malignant ascites, I put her on scheduled Zofran and added Phenergan. Some Nausea persisting    Dehydration: Likely from nausea vomiting    Hyponatremia: Improved, to be initially from dehydration   Ascites: Likely secondary to malignancy, status post therapeutic/diagnostic paracentesis    Pancreatic cyst: Incidental, CA 19-9 level normal  Leg pain: Mostly of left lower leg and possibly neuropathic. Noted Patient's previous history of compression fracture. Appreciate palliative care help. Narcotics. Try lidocaine patch-which seems to be helping  Code Status: Partial code  Family Communication: Met with family yesterday  Disposition Plan: Plan is for assisted living with hospice versus hospice facility, hopefully as early as tomorrow  Consultants:  Interventional radiology  Oncology  Gastroenterology-  Procedures:  Status post ultrasound-guided paracentesis for diagnosis/therapeutic yielding 1.6 L  Antibiotics:  IV Cipro for SBP prophylaxis  12/24-12/26  HPI/Subjective: Nausea  better. Pain a little bit better controlled.  Objective: Filed Vitals:   06/24/13 0635  BP: 120/65  Pulse: 76  Temp: 99 F (37.2 C)  Resp: 20    Intake/Output Summary (Last 24 hours) at 06/24/13 0924 Last data filed at 06/24/13 5784  Gross per 24 hour  Intake   2070 ml  Output      0 ml  Net   2070 ml   Filed Weights   06/18/13 0935  Weight: 58.378 kg (128 lb 11.2 oz)    Exam:   General:   fatigue, no acute distress  Cardiovascular: Regular rate and rhythm, S1-S2  Respiratory: Clear to auscultation bilaterally and  Abdomen: Soft, nondistended, nontender?, Hypoactive bowel sounds  Musculoskeletal: No clubbing or cyanosis, trace edema. Chronic pain of the left lower extremity all the way down to the foot starting at the hip-improved  Data Reviewed: Basic Metabolic Panel:  Recent Labs Lab 06/18/13 0520 06/19/13 0730 06/20/13 0610  NA 128* 129* 130*  K 3.8 3.4* 3.8  CL 90* 96 98  CO2 25 24 24   GLUCOSE 109* 83 89  BUN 14 9 9   CREATININE 0.43* 0.39* 0.40*  CALCIUM 9.0 8.2* 8.3*   Liver Function Tests:  Recent Labs Lab 06/18/13 0520 06/19/13 0730  AST 13 12  ALT <5 <5  ALKPHOS 70 53  BILITOT 0.4 0.3  PROT 7.0 5.2*  ALBUMIN 2.5* 1.8*    Recent Labs Lab 06/18/13 0520  LIPASE 9*   No results found for this basename: AMMONIA,  in the last 168 hours CBC:  Recent Labs Lab 06/18/13 0520 06/19/13 0730  WBC 7.9 5.7  NEUTROABS 6.5  --   HGB 12.1 11.4*  HCT 35.2* 33.3*  MCV 84.4 84.3  PLT 441* 321   Cardiac Enzymes: No results  found for this basename: CKTOTAL, CKMB, CKMBINDEX, TROPONINI,  in the last 168 hours BNP (last 3 results)  Recent Labs  06/18/13 0520  PROBNP 340.1   CBG: No results found for this basename: GLUCAP,  in the last 168 hours  Recent Results (from the past 240 hour(s))  MRSA PCR SCREENING     Status: None   Collection Time    06/18/13 10:06 AM      Result Value Range Status    MRSA by PCR NEGATIVE  NEGATIVE Final   Comment:            The GeneXpert MRSA Assay (FDA     approved for NASAL specimens     only), is one component of a     comprehensive MRSA colonization     surveillance program. It is not     intended to diagnose MRSA     infection nor to guide or     monitor treatment for     MRSA infections.  BODY FLUID CULTURE     Status: None   Collection Time    06/18/13  4:20 PM      Result Value Range Status   Specimen Description ASCITIC   Final   Special Requests Normal   Final   Gram Stain     Final   Value: RARE WBC PRESENT,BOTH PMN AND MONONUCLEAR     NO ORGANISMS SEEN     Performed at [facility]   Culture     Final   Value: NO GROWTH 3 DAYS     Performed at [facility]   Report Status 06/22/2013 FINAL   Final     Studies: No results found.  Scheduled Meds: . aspirin EC  81 mg Oral Daily  . docusate sodium  100 mg Oral BID  . feeding supplement (PRO-STAT SUGAR FREE 64)  30 mL Oral BID WC  . heparin  5,000 Units Subcutaneous Q8H  . levothyroxine  25 mcg Oral QAC breakfast  . lidocaine  1 patch Transdermal Q24H  . LORazepam  0.5 mg Oral Q8H  .  morphine injection  4 mg Intravenous Once  . ondansetron (ZOFRAN) IV  8 mg Intravenous Q6H  . polyethylene glycol  17 g Oral Daily  . senna  1 tablet Oral BID   Continuous Infusions: . sodium chloride 75 mL/hr at 06/24/13 1478    Active Problems:   HYPERTENSION   Hypothyroidism   Constipation   Abdominal malignancy   Abdominal pain   Nausea and vomiting   Dehydration   Hyponatremia   Ascites   Pancreatic cyst    Time spent: 15 minutes    Hollice Espy  [facility] Pager [phone]. If 7PM-7AM, please contact night-coverage at [email or web address], password [facility] 06/24/2013, 9:24 AM  LOS: 6 days

## 2013-06-24 NOTE — Progress Notes (Signed)
CSW assisting with d/c planning. [facility] contacted and screening for readmission requested. Nsg will be here at 9 am Tuesday. [facility] was unable to send screener today. CSW will continue to review PN for d/c needs.  Cori Razor LCSW [phone]

## 2013-06-25 DIAGNOSIS — Z515 Encounter for palliative care: Secondary | ICD-10-CM

## 2013-06-25 DIAGNOSIS — M79609 Pain in unspecified limb: Secondary | ICD-10-CM

## 2013-06-25 MED ORDER — TRAMADOL HCL 50 MG PO TABS: 50.0000 mg | ORAL_TABLET | Freq: Four times a day (QID) | ORAL | Status: AC | PRN

## 2013-06-25 MED ORDER — LIDOCAINE 5 % EX PTCH: MEDICATED_PATCH | CUTANEOUS | Status: AC

## 2013-06-25 MED ORDER — LORAZEPAM 0.5 MG PO TABS: 0.5000 mg | ORAL_TABLET | Freq: Three times a day (TID) | ORAL | Status: AC

## 2013-06-25 MED ORDER — ONDANSETRON 8 MG PO TBDP: 8.0000 mg | ORAL_TABLET | Freq: Three times a day (TID) | ORAL | Status: AC | PRN

## 2013-06-25 MED ORDER — POLYETHYLENE GLYCOL 3350 17 G PO PACK: 17.0000 g | PACK | Freq: Every day | ORAL | Status: AC

## 2013-06-25 MED ORDER — PROMETHAZINE HCL 25 MG PO TABS: 25.0000 mg | ORAL_TABLET | Freq: Four times a day (QID) | ORAL | Status: AC | PRN

## 2013-06-25 NOTE — Progress Notes (Signed)
CSW assisting with d/c planning. Pt screened by [facility]. ALF is able to readmit pt with hospice services on 12/31. CSW has spoken with pt's son, [name] regarding hospice services. He has accepted services with [facility]. CSW will continue to follow to assist with d/c to [facility] in am.  Cori Razor LCSW [phone]

## 2013-06-25 NOTE — Progress Notes (Signed)
Late Entry Notified by Darryl Nestle [facility] plan for return to [facility] with services of [facility]) writer spoke with patient approximately 3:30 pm, no family at bedside she confirmed son had recently left room; contacted son [name] via c: [phone], he confirmed request for hospice to follow at ALF; he informs request for non-emergent transport and is unsure at this time about any equipment needs; he voiced he would talk with [facility] assessment RN once pt was back at ALF [facility] aware of referral As discussion ended after 5 pm; Writer will follow up in am with [facility], [facility] SW and family  Valente David, RN 06/25/2013, 11:39 PM [facility] RN Liaison [phone]

## 2013-06-25 NOTE — Progress Notes (Signed)
The cytology report is noted. She most likely has a GYN malignancy.  I discussed the case with Dr. Ladona Ridgel. The plan for hospice/comfort care is noted.  I am available to see her as needed. Please contact me if oncology followup is desired.

## 2013-06-25 NOTE — Progress Notes (Signed)
Physical Therapy Treatment Patient Details Name: [name] MRN: [id number] DOB: February 10, 1930 Today's Date: 06/25/2013 Time: 4098-1191 PT Time Calculation (min): 25 min  PT Assessment / Plan / Recommendation  History of Present Illness 79 yo female admitted from SNF with abdominal malignancy. Paraentesis on 12/23. Hx o RA, HTN, psychosis, fall, R femur ORIF 02/2013   PT Comments   Pt in bed just finished a bath.  Assisted to EOB total assist but once upright pt was able to sit at Supervision level.  C/o max nausea pt started to vomit but only air and sylvia was present.  RN notified.  Pt sat EOB x 8 min than assisted to recliner and positioned with pillows.   Follow Up Recommendations  SNF     Does the patient have the potential to tolerate intense rehabilitation     Barriers to Discharge        Equipment Recommendations       Recommendations for Other Services    Frequency Min 2X/week   Progress towards PT Goals    Plan      Precautions / Restrictions Precautions Precautions: Fall Precaution Comments: B hand arthritis/deformities Restrictions Weight Bearing Restrictions: No RLE Weight Bearing: Weight bearing as tolerated Other Position/Activity Restrictions: per PT note s/p ORIF 02/2013    Pertinent Vitals/Pain C/o max nausea with activity    Mobility  Bed Mobility Bed Mobility: Supine to Sit Supine to Sit: 1: +1 Total assist Supine to Sit: Patient Percentage: 30% Details for Bed Mobility Assistance: assist to rise and used bed pad to swival hips to EOB.  Once upright pt was able to sit EOB at Supervision level.  Pt c/o max nausea and active vomiting but only air was expelled.  RN notified.  Pt sat EOB a good 8 min.  Transfers Transfers: Pharmacologist Details for Transfer Assistance: "Red[employer] Pulling" stand pivot sit from bed to recliner with very limited self assist and B LE buckling.  Pt demon increased ability to support self and weight shift with small side  steps.  Ambulation/Gait Ambulation/Gait Assistance Details: Unable to attempt     PT Goals (current goals can now be found in the care plan section)    Visit Information  Last PT Received On: 06/25/13 Assistance Needed: +2 History of Present Illness: 79 yo female admitted from SNF with abdominal malignancy. Paraentesis on 12/23. Hx o RA, HTN, psychosis, fall, R femur ORIF 02/2013    Subjective Data      Cognition       Balance     End of Session PT - End of Session Equipment Utilized During Treatment: Gait belt Activity Tolerance: Other (comment) (Max c/o nausea with activity and general "not feeling well") Patient left: in chair;with call bell/phone within reach   Felecia Shelling  PTA [facility]  Acute  Rehab Pager      [phone]

## 2013-06-25 NOTE — Discharge Summary (Signed)
Physician Discharge Summary  [name] WGN:[id number] DOB: 03-Feb-1930 DOA: 06/18/2013  PCP: Patient will be followed by hospice of [place]  Admit date: 06/18/2013 Discharge date: 06/26/2013  Time spent: 25 minutes  Recommendations for Outpatient Follow-up:  1. 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   Abdominal pain   Nausea and vomiting   Dehydration   Hyponatremia   Ascites   Pancreatic cyst   Discharge Condition: Patient is being discharged back to assisted living with hospice care. Long-term prognosis Limited  Diet recommendation: Regular diet, comfort feeds  Filed Weights   06/18/13 0935  Weight: 58.378 kg (128 lb 11.2 oz)    History of present illness:  On 12/23:79 y.o. female with a past medical history of cognitive impairment, recent hip fracture in September of 2014, nursing home resident, who was transferred to the emergency department today with complaints of nausea vomiting and abdominal pain. In July 2014, she had a CT scan of abdomen and pelvis performed on 01/05/2013 that showed a 2.6 x 1.9 cm cystic neoplasm or pseudocyst. This was further worked up with an MRCP which showed a well-circumscribed cystic lesion within the pancreatic tail.  Patient was given a referral to General Surgery, as it appears that she was not found to be a surgical candidate.   She presents today with a three-day history of abdominal distention associated with generalized abdominal pain, nausea, vomiting, unable to hold by mouth down. She also complains of generalized weakness, fatigue, overall feeling poorly. She denies hematemesis, bloody stools, melena. She endorses constipation, not having a bowel movement for the last 3 days at least. In the emergency department today she had a repeat CT scan of abdomen and  pelvis which now shows a new moderate large volume ascites with diffuse infiltration of the omentum that could be consistent with metastatic disease.  Also noted on CT scan with the presence of colonic stool.  Hospital Course:  Interventional radiology was consulted and patient underwent a paracentesis for diagnostic and therapeutic purposes yielding 1.6 L. This was done on 12/27. Gastroenterology and oncology were consulted as well and given elevated CA 125 levels, even before pathology returned, it was almost completely suspected that this was metastatic gynecologic malignancy. Patient is not felt to be a surgical candidate and treatment options would be quite limited. Hospice and palliative care medicine was consulted. After a number of meetings and discussions, it was felt that the patient herself did not have complete capacity for decision making. Patient's sons who are quite involved and into hospice before with her sister, and event that had left the patient herself weary of hospice. After some discussions, it was felt best that patient go back to assisted living with hospice. Focus would be on comfort limiting medicines mostly for pain and nausea control as described below. Patient will be a partial code. Pathology returned back for high grade serous carcinoma of dying of malignancy  Active Problems:   HYPERTENSION: Stable during his hospitalization. Blood pressures have been in the 120s systolic    Hypothyroidism: Patient is on Synthroid during her hospitalization.    Constipation: Patient was treated with fleets enema with large amount of bowel movements. She'll be on Colace 100 twice a day and MiraLAX daily upon discharge. 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    Abdominal pain: Improved once abdomen  tapped. Her biggest complaint of pain is in her leg.    Nausea and vomiting: Been difficult to control. Patient has been managing with alternating Zofran and Phenergan  and treating her constipation. We'll be discharging him on Zofran disintegrating tablets alternating with Phenergan by mouth.   Dehydration: Secondary to nausea and vomiting and poor by mouth intake. IV fluids were limited, as concerned because only more ascites.    Hyponatremia: On admission, sodium 128. With gentle hydration, sodium up to 130.    Ascites:  Pathology was sent and eventually came back   left leg pain: Mostly of the left lower leg and possibly neuropathic noting patient's previous history of compression fracture. Narcotics caused much nausea. Lidocaine patch seemed to be better tolerated along with Ultram when necessary.  Procedures:  Status post ultrasound-guided paracentesis for diagnosis/therapeutic yielding 1.6 L  Consultations:  Palliative care  Oncology  Gastroenterology  Interventional radiology  Discharge Exam: Filed Vitals:   06/25/13 0635  BP: 128/74  Pulse: 95  Temp: 98 F (36.7 C)  Resp: 18    General: Fatigued, no acute distress Cardiovascular: Regular rate and rhythm, S1-S2 Respiratory: Clear to auscultation bilaterally  Discharge Instructions  Discharge Orders   Future Orders Complete By Expires   Diet general  As directed    Increase activity slowly  As directed        Medication List    STOP taking these medications       acetaminophen 500 MG tablet  Commonly known as:  TYLENOL     aspirin EC 81 MG tablet     HYDROcodone-acetaminophen 5-325 MG per tablet  Commonly known as:  NORCO/VICODIN     lactulose 10 GM/15ML solution  Commonly known as:  CHRONULAC     vitamin C 500 MG tablet  Commonly known as:  ASCORBIC ACID     Vitamin D3 5000 UNITS Caps     zinc sulfate 220 MG capsule      TAKE these medications       alum & mag hydroxide-simeth 200-200-20 MG/5ML suspension  Commonly known as:  MAALOX/MYLANTA  Take 30 mLs by mouth every 6 (six) hours as needed for indigestion.     DSS 100 MG Caps  Take 100 mg by mouth  2 (two) times daily.     levothyroxine 25 MCG tablet  Commonly known as:  SYNTHROID, LEVOTHROID  Take 25 mcg by mouth daily before breakfast.     lidocaine 5 %  Commonly known as:  LIDODERM  - Apply to left leg  - Remove & Discard patch within 12 hours or as directed by MD     LORazepam 0.5 MG tablet  Commonly known as:  ATIVAN  Take 1 tablet (0.5 mg total) by mouth every 8 (eight) hours.     magnesium hydroxide 400 MG/5ML suspension  Commonly known as:  MILK OF MAGNESIA  Take 30 mLs by mouth at bedtime as needed for constipation.     ondansetron 8 MG disintegrating tablet  Commonly known as:  ZOFRAN ODT  Take 1 tablet (8 mg total) by mouth every 8 (eight) hours as needed for nausea or vomiting.     polyethylene glycol packet  Commonly known as:  MIRALAX / GLYCOLAX  Take 17 g by mouth daily.     promethazine 25 MG tablet  Commonly known as:  PHENERGAN  Take 1 tablet (25 mg total) by mouth every 6 (six) hours as needed for nausea or vomiting.  traMADol 50 MG tablet  Commonly known as:  ULTRAM  Take 1 tablet (50 mg total) by mouth every 6 (six) hours as needed for moderate pain.       Allergies  Allergen Reactions  . Hydrochlorothiazide   . Leflunomide   . Propoxyphene And Methadone   . Remicade [Infliximab]   . Zinc   . Colchicine Anxiety  . Famotidine Anxiety  . Penicillins Anxiety      The results of significant diagnostics from this hospitalization (including imaging, microbiology, ancillary and laboratory) are listed below for reference.    Significant Diagnostic Studies: Ct Abdomen Pelvis W Contrast  06/18/2013   CLINICAL DATA:  Nausea and vomiting.  EXAM: CT ABDOMEN AND PELVIS WITH CONTRAST  TECHNIQUE: Multidetector CT imaging of the abdomen and pelvis was performed using the standard protocol following bolus administration of intravenous contrast.  CONTRAST:  100 mL of Omnipaque 300 IV contrast  COMPARISON:  CT of the abdomen and pelvis performed  01/05/2013  FINDINGS: The visualized lung bases are clear. Scattered coronary artery calcifications are seen.  There is new moderate to large volume ascites throughout the abdomen and pelvis. This surrounds small and large bowel loops, and the liver and spleen, with mass effect on the bladder.  There is diffuse infiltration of the omentum at the left upper quadrant and left mid abdomen, with irregular nodularity, most compatible with omental caking. In addition, there is heterogeneous soft tissue thickening with regard to the inferior aspect of the ascites within the pelvis, raising concern for dependent metastases. The primary source of the patient's apparent malignancy is not definitely identified on this study.  The common hepatic duct measures 0.8 cm in maximal diameter, within normal limits given the patient's age. There is mild nonspecific prominence of the intrahepatic biliary ducts. The gallbladder is distended. No definite gallstones are seen on CT. A 2.0 cm cystic lesion within the body of the pancreas is grossly unchanged from the prior study. The pancreas is otherwise unremarkable. The adrenal glands are within normal limits.  A few tiny bilateral renal cysts are seen; the kidneys are otherwise unremarkable in appearance. There is no evidence of hydronephrosis. No renal or ureteral stones are identified. No perinephric stranding is appreciated.  No free fluid is identified. The small bowel is unremarkable in appearance. The stomach is within normal limits. No acute vascular abnormalities are seen. Scattered calcification is noted along the abdominal aorta and its branches.  The appendix is not definitely characterized; there is no evidence for appendicitis. The colon is largely filled with stool, raising concern for mild constipation.  The bladder is mildly distended and grossly unremarkable in appearance, though partially compressed by the patient's ascites. The uterus demonstrates new heterogeneity and  multiple prominent areas of cystic change, raising question for endometrial carcinoma given the abdominal findings. The ovaries are not well assessed. No inguinal lymphadenopathy is seen.  No acute osseous abnormalities are identified. Right hip hardware is incompletely imaged but appears grossly unremarkable. There is chronic compression deformity involving the superior endplate of vertebral body T11.  IMPRESSION: 1. New moderate to large volume ascites throughout the abdomen and pelvis. 2. Diffuse infiltration of the omentum at the left upper quadrant and left mid abdomen, with associated nodularity, suspicious for omental caking. Heterogeneous soft tissue thickening with regard to the inferior aspect of the ascites within the pelvis, raising concern for dependent metastases. 3. The primary source of the malignancy is not definitely seen, though there is new heterogeneity and  multiple prominent areas of cystic change within the uterus, which could reflect endometrial carcinoma. Diagnostic paracentesis could be considered for further evaluation, as deemed clinically appropriate. 4. Mild nonspecific prominence of the intrahepatic biliary ducts, with gallbladder distention. No definite stones identified on CT. 5. Relatively stable 2.0 cm cystic lesion within the body of the pancreas. 6. Few tiny bilateral renal cysts seen. 7. Scattered calcification along the abdominal aorta and its branches. 8. Scattered coronary artery calcifications seen. 9. Colon largely filled with stool, raising concern for mild constipation.   Electronically Signed   By: Roanna Raider M.D.   On: 06/18/2013 07:16   US Paracentesis  06/18/2013   CLINICAL DATA:  Patient with history of mucinous cystic neoplasm of the pancreas, cystic changes within the uterus, omental nodularity, ascites. Request is made for diagnostic and therapeutic paracentesis up to 2 liters.  EXAM: ULTRASOUND GUIDED DIAGNOSTIC AND THERAPEUTIC PARACENTESIS  COMPARISON:   None.  PROCEDURE: An ultrasound guided paracentesis was thoroughly discussed with the patient and questions answered. The benefits, risks, alternatives and complications were also discussed. The patient understands and wishes to proceed with the procedure. Written consent was obtained.  Ultrasound was performed to localize and mark an adequate pocket of fluid in the left lower. quadrant of the abdomen. The area was then prepped and draped in the normal sterile fashion. 1% Lidocaine was used for local anesthesia. Under ultrasound guidance a 19 gauge Yueh catheter was introduced. Paracentesis was performed. The catheter was removed and a dressing applied.  COMPLICATIONS: None immediate  FINDINGS: A total of approximately 1.6 liters. of slightly turbid, yellow fluid was removed. A fluid sample was sent for laboratory analysis.  IMPRESSION: Successful ultrasound guided diagnostic and therapeutic paracentesis yielding 1.6 liters of ascites.  Read by: Jeananne Rama ,P.A.-C.   Electronically Signed   By: Simonne Come M.D.   On: 06/18/2013 16:39    Microbiology: Recent Results (from the past 240 hour(s))  MRSA PCR SCREENING     Status: None   Collection Time    06/18/13 10:06 AM      Result Value Range Status   MRSA by PCR NEGATIVE  NEGATIVE Final   Comment:            The GeneXpert MRSA Assay (FDA     approved for NASAL specimens     only), is one component of a     comprehensive MRSA colonization     surveillance program. It is not     intended to diagnose MRSA     infection nor to guide or     monitor treatment for     MRSA infections.  BODY FLUID CULTURE     Status: None   Collection Time    06/18/13  4:20 PM      Result Value Range Status   Specimen Description ASCITIC   Final   Special Requests Normal   Final   Gram Stain     Final   Value: RARE WBC PRESENT,BOTH PMN AND MONONUCLEAR     NO ORGANISMS SEEN     Performed at [facility]   Culture     Final   Value: NO GROWTH 3 DAYS      Performed at [facility]   Report Status 06/22/2013 FINAL   Final     Labs: Basic Metabolic Panel:  Recent Labs Lab 06/19/13 0730 06/20/13 0610  NA 129* 130*  K 3.4* 3.8  CL 96 98  CO2 24  24  GLUCOSE 83 89  BUN 9 9  CREATININE 0.39* 0.40*  CALCIUM 8.2* 8.3*   Liver Function Tests:  Recent Labs Lab 06/19/13 0730  AST 12  ALT <5  ALKPHOS 53  BILITOT 0.3  PROT 5.2*  ALBUMIN 1.8*   No results found for this basename: LIPASE, AMYLASE,  in the last 168 hours No results found for this basename: AMMONIA,  in the last 168 hours CBC:  Recent Labs Lab 06/19/13 0730  WBC 5.7  HGB 11.4*  HCT 33.3*  MCV 84.3  PLT 321   Cardiac Enzymes: No results found for this basename: CKTOTAL, CKMB, CKMBINDEX, TROPONINI,  in the last 168 hours BNP: BNP (last 3 results)  Recent Labs  06/18/13 0520  PROBNP 340.1   CBG: No results found for this basename: GLUCAP,  in the last 168 hours     Signed:  Hollice Espy  [facility] 06/25/2013, 2:47 PM

## 2013-06-25 NOTE — Progress Notes (Signed)
Pt's left 2nd toenail came off when NT was giving pt bath. Nail bed was dry, no bleeding noted. Covered with bandaid for protection of nail bed. Pt not bothered with toenail falling off. No signs of pain/discomfort at this time.

## 2013-06-26 NOTE — Progress Notes (Signed)
CSW assisting with d/c planning. Pt will return to [facility] via [facility] transport today. Pt's son has been notified. Hospital bed has been delivered to ALF. Transport requested.  Cori Razor LCSW [phone]

## 2013-06-26 NOTE — Progress Notes (Signed)
No change from DC Summary done 12/30.  Pt for DC back to ALF with hospice today.

## 2013-06-26 NOTE — Progress Notes (Addendum)
Follow-up:  Writer spoke with Asher Muir CSW [phone]) and Windell Moulding Director Memory Care at [facility] [phone]); plan remains for discharge by non-emergent transport today to ALF; per Windell Moulding she will fax request for initiating hospice services to [facility] per facility primary MD Dr Cathey Endow and PA Faythe Ghee; Windell Moulding requests DME delivered to facility prior to pt transfer Writer left VM for [facility] representative Herman and CSW Clare aware of needed DME:  Complete package D: fully electric hospital bed with AP&P mattress, full rails  and over-bed table **Please note Windell Moulding ALF Memory Care unit director needs to be called @ [phone] to arrange for delivery of needed DME- CMRN/CSW please confirm ALF Address : [facility] [address] [place] [address]) to assure equipment is delivered to correct address as this is not address on patient's facesheet Pt Information faxed to [facility]  Please contact with any questions  Valente David, RN 06/26/2013, 9:04 AM [facility] [place] RN Liaison [phone]

## 2013-07-28 DEATH — deceased

## 2013-08-05 ENCOUNTER — Telehealth: Payer: Self-pay

## 2013-08-05 NOTE — Telephone Encounter (Signed)
Patient past away per Obituary in [place] [employer] °

## 2014-07-10 ENCOUNTER — Encounter (HOSPITAL_COMMUNITY): Payer: Self-pay | Admitting: Gastroenterology

## 2014-08-22 IMAGING — CT CT HEAD W/O CM
4 of 5 series · 16 of 40 positions shown, 18 images · non-contrast
Comparison: 10/24/2011

CT HEAD

CLINICAL DATA: The patient fell out of a chair striking the left
side of the head.  Left scalp contusion.  Head and neck pain.

CT HEAD WITHOUT CONTRAST
CT CERVICAL SPINE WITHOUT CONTRAST
TECHNIQUE: Multidetector CT imaging of the head and cervical spine
was performed following the standard protocol without intravenous
contrast.  Multiplanar CT image reconstructions of the cervical
spine were also generated.

[Series 3: head w/o · axial · non-contrast · 0.44mm/px · z∈[-71,-21]mm · 2 of 30 slices shown]
[im 10/30  brain]
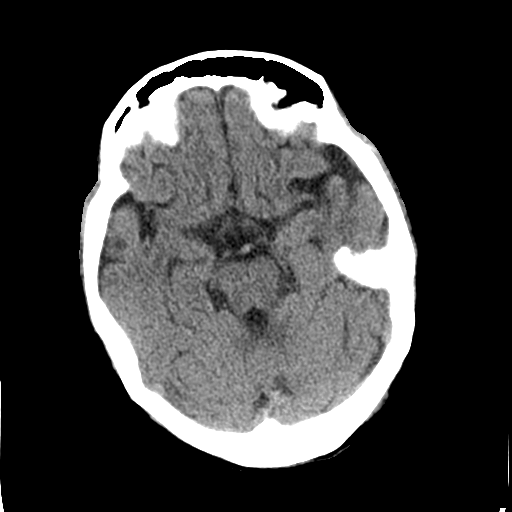
[im 20/30  brain]
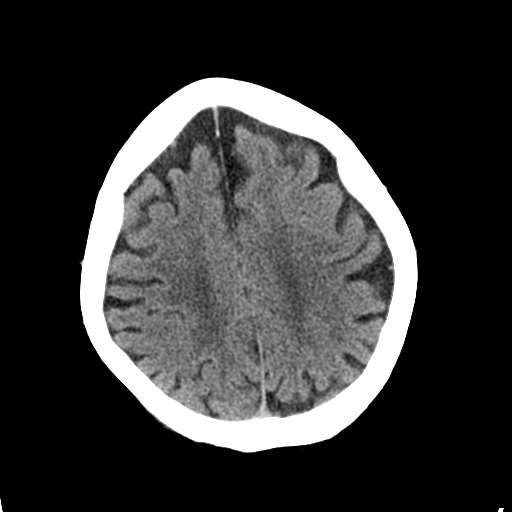

[Series 4: bone windows · axial · 0.44mm/px · z∈[-89,-29]mm · 3 of 50 slices shown]
[im 10/50  bone]
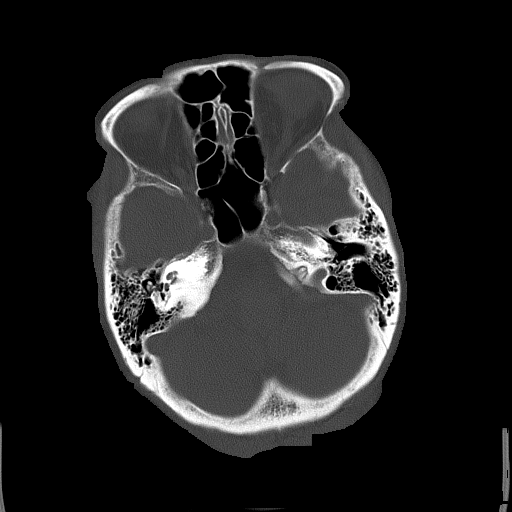
[im 20/50  bone]
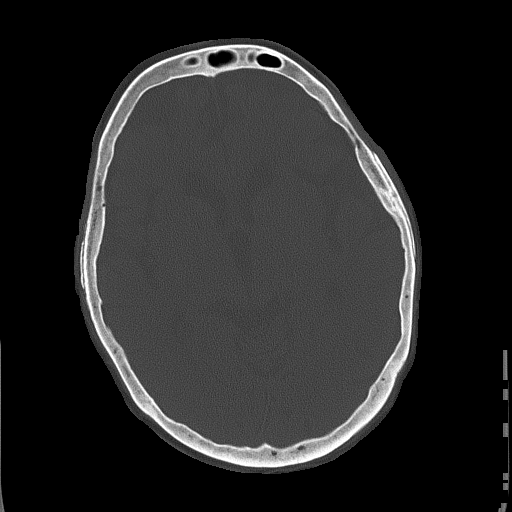
[im 30/50  bone]
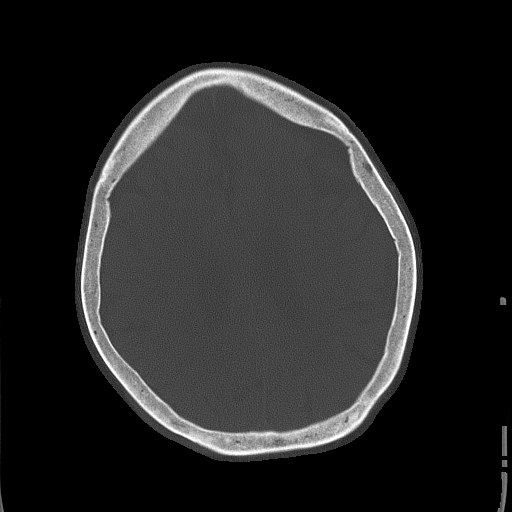

[Series 602: <mpr thick range> · coronal · 0.31mm/px · 3 of 43 slices shown]
[im 15/43  brain]
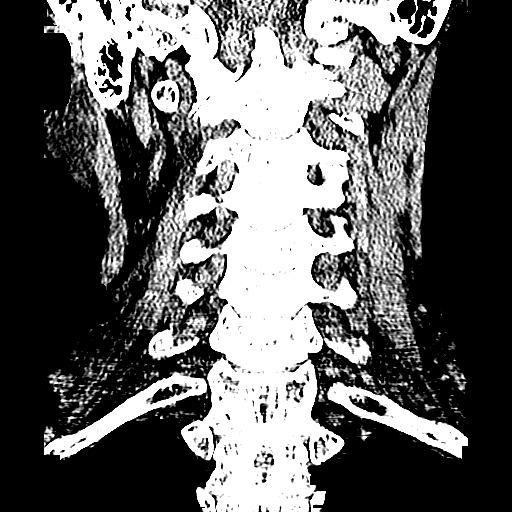
[im 19/43  brain]
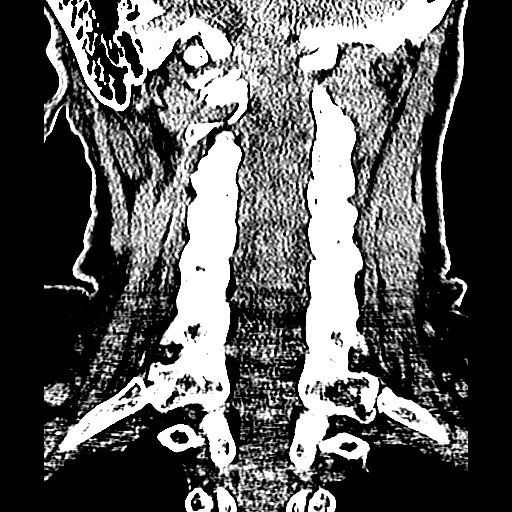
[im 24/43  brain]
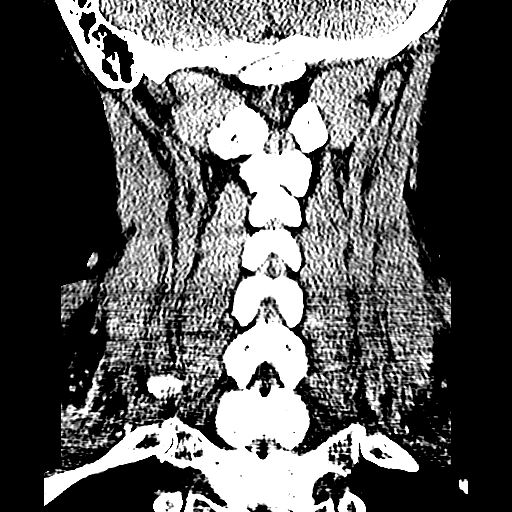

[Series 603: <mpr thick range(1)> · axial · 0.31mm/px · z∈[-255,-138]mm · 8 of 81 slices shown, 10 images]
[im 9/81  brain]
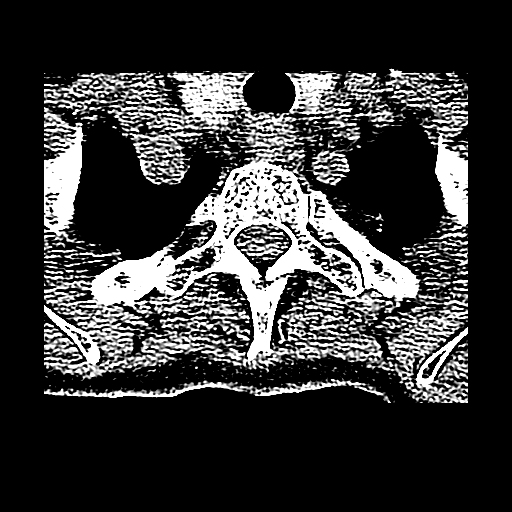
[im 9/81  bone]
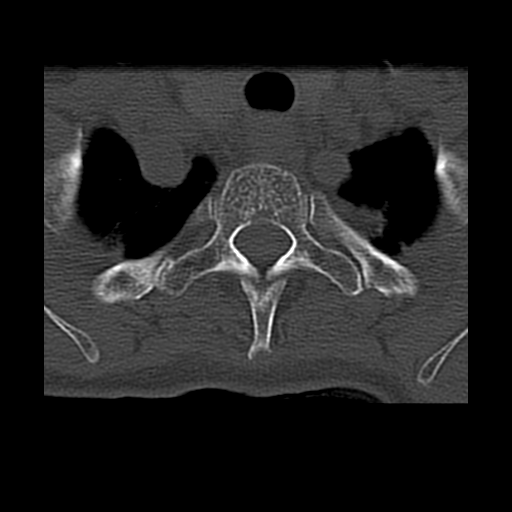
[im 18/81  brain]
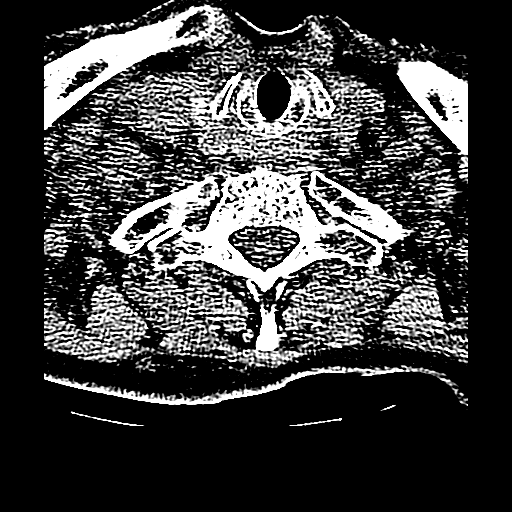
[im 27/81  brain]
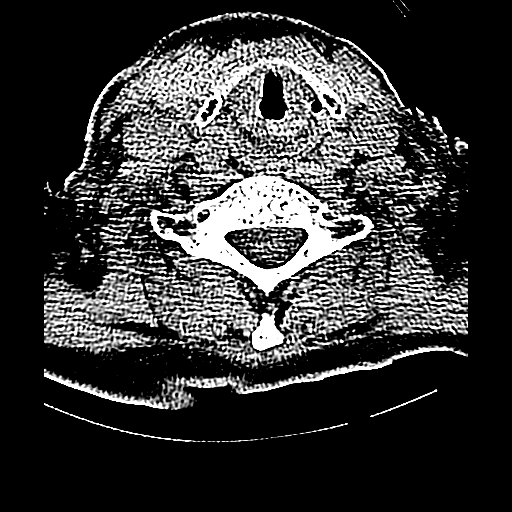
[im 36/81  brain]
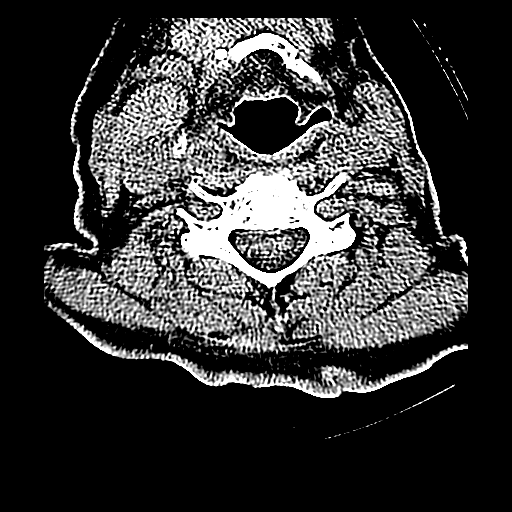
[im 45/81  brain]
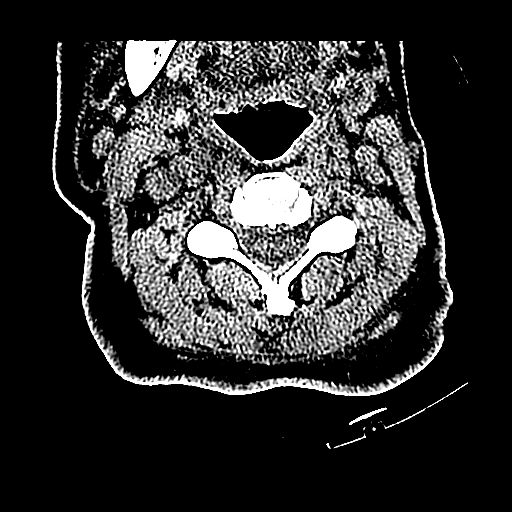
[im 45/81  bone]
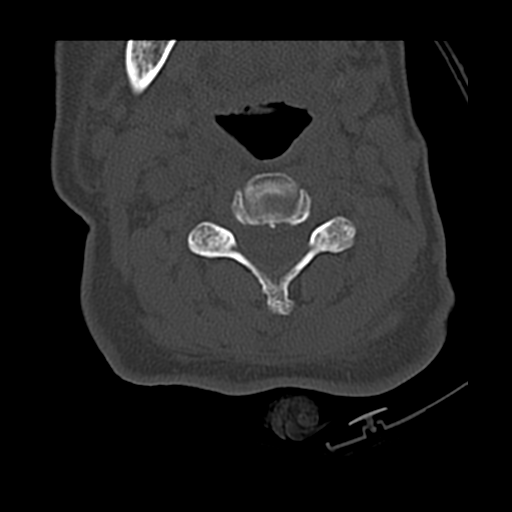
[im 54/81  brain]
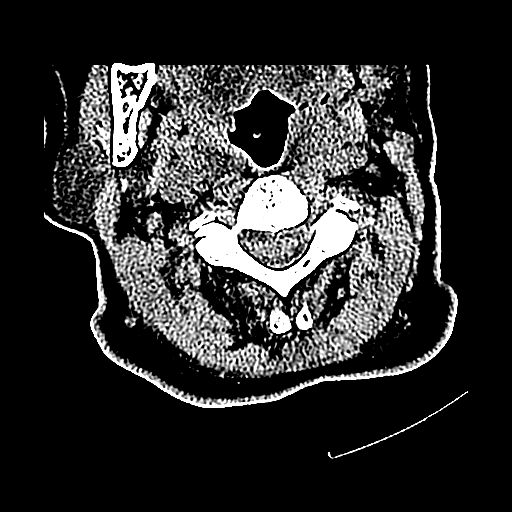
[im 63/81  brain]
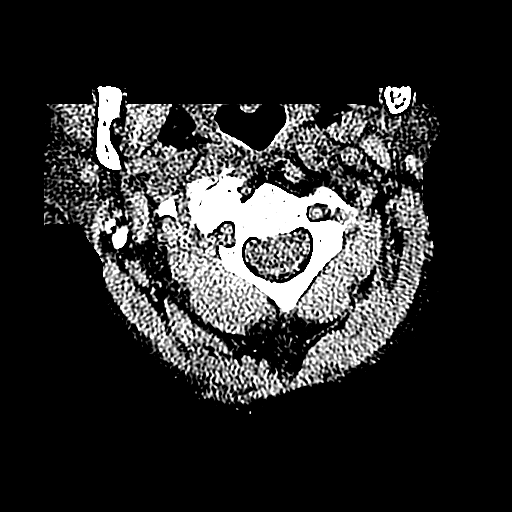
[im 72/81  brain]
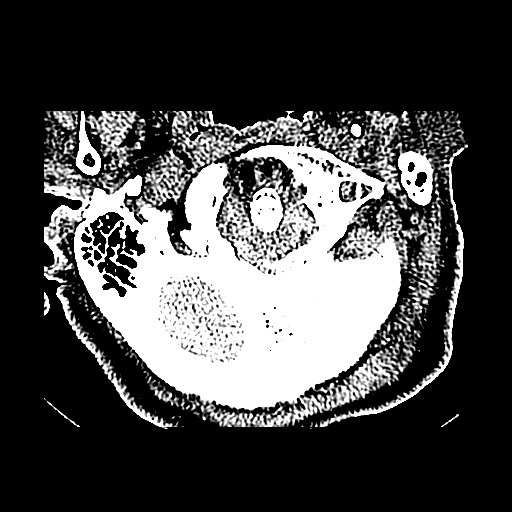

[16 of 40 positions shown; findings below may reference images not displayed]

FINDINGS: Diffuse cerebral atrophy.  Mild ventricular dilatation
consistent with central atrophy.  Low attenuation changes in the
deep white matter consistent with small vessel ischemia.  No mass
effect or midline shift.  No abnormal extra-axial fluid
collections.  Gray-white matter junctions are distinct.  Basal
cisterns are not effaced.  No evidence of acute intracranial
hemorrhage.  No depressed skull fractures.  Visualized paranasal
sinuses and mastoid air cells are not opacified.  Note that the
odontoid process is present in the cisterna magna consistent with
basilar invagination.
IMPRESSION: No acute intracranial abnormalities.  Chronic atrophy and small
vessel ischemic change.

CT CERVICAL SPINE
FINDINGS: There is chronic abnormal alignment of the C1-2
interspace with posterior subluxation of C2 with respect to C1
resulting in increased space between the anterior arch of C1 and
the odontoid process and decreased space between the posterior
aspect of the odontoid process and the posterior elements of C1.
Canal measures 6.5 mm at its lowest point, similar to previous
study. The tip of the odontoid process extends above the level of
the clivus. Changes are consistent with basilar invagination. This
is stable since the previous study.  The remainder the cervical
vertebrae demonstrate normal alignment.  Facet joints are normally
aligned.  Degenerative narrowing of the cervical disc spaces with
endplate hypertrophic changes.  No vertebral compression
deformities.  No prevertebral soft tissue swelling.  No focal bone
lesion or bone destruction.  Bone cortex and trabecular
architecture appear intact.  Diffuse bone demineralization.
IMPRESSION: Chronic abnormalities at C1-2 with superior and posterior
displacement of C10 with respect to C1 resulting and basilar
invagination and narrowing of the central canal.  This is stable
since the previous study.  No acute displaced fractures are
identified.
